# Patient Record
Sex: Female | Born: 1940 | Race: White | Hispanic: No | Smoking: Never smoker
Health system: Southern US, Community
[De-identification: ages and names within clinical notes are randomized; demographics above are authoritative.]

## PROBLEM LIST (undated history)

## (undated) DIAGNOSIS — N200 Calculus of kidney: Secondary | ICD-10-CM

## (undated) DIAGNOSIS — I471 Supraventricular tachycardia: Secondary | ICD-10-CM

## (undated) DIAGNOSIS — K219 Gastro-esophageal reflux disease without esophagitis: Secondary | ICD-10-CM

## (undated) DIAGNOSIS — N185 Chronic kidney disease, stage 5: Secondary | ICD-10-CM

## (undated) DIAGNOSIS — K7460 Unspecified cirrhosis of liver: Secondary | ICD-10-CM

## (undated) DIAGNOSIS — L03116 Cellulitis of left lower limb: Secondary | ICD-10-CM

## (undated) DIAGNOSIS — I10 Essential (primary) hypertension: Secondary | ICD-10-CM

## (undated) DIAGNOSIS — N390 Urinary tract infection, site not specified: Secondary | ICD-10-CM

## (undated) DIAGNOSIS — Z87442 Personal history of urinary calculi: Secondary | ICD-10-CM

## (undated) HISTORY — DX: Gastro-esophageal reflux disease without esophagitis: K21.9

## (undated) HISTORY — DX: Essential (primary) hypertension: I10

## (undated) HISTORY — PX: ABDOMINAL HYSTERECTOMY: SHX81

## (undated) HISTORY — DX: Urinary tract infection, site not specified: N39.0

## (undated) HISTORY — DX: Supraventricular tachycardia: I47.1

## (undated) HISTORY — DX: Calculus of kidney: N20.0

---

## 2004-06-05 ENCOUNTER — Other Ambulatory Visit: Payer: Self-pay

## 2004-10-22 ENCOUNTER — Emergency Department: Payer: Self-pay | Admitting: Emergency Medicine

## 2007-02-19 ENCOUNTER — Ambulatory Visit: Payer: Self-pay | Admitting: Internal Medicine

## 2007-03-22 ENCOUNTER — Ambulatory Visit: Payer: Self-pay | Admitting: Internal Medicine

## 2007-04-17 ENCOUNTER — Ambulatory Visit: Payer: Self-pay | Admitting: Internal Medicine

## 2007-04-21 ENCOUNTER — Ambulatory Visit: Payer: Self-pay | Admitting: Internal Medicine

## 2007-05-22 ENCOUNTER — Ambulatory Visit: Payer: Self-pay | Admitting: Internal Medicine

## 2007-06-22 ENCOUNTER — Ambulatory Visit: Payer: Self-pay | Admitting: Internal Medicine

## 2007-07-22 ENCOUNTER — Ambulatory Visit: Payer: Self-pay | Admitting: Internal Medicine

## 2007-08-22 ENCOUNTER — Ambulatory Visit: Payer: Self-pay | Admitting: Internal Medicine

## 2008-04-12 LAB — HM MAMMOGRAPHY: HM Mammogram: NORMAL

## 2008-04-13 ENCOUNTER — Ambulatory Visit: Payer: Self-pay | Admitting: Unknown Physician Specialty

## 2009-04-12 LAB — HM PAP SMEAR

## 2009-08-19 ENCOUNTER — Emergency Department: Payer: Self-pay | Admitting: Emergency Medicine

## 2009-10-20 ENCOUNTER — Emergency Department: Payer: Self-pay | Admitting: Emergency Medicine

## 2010-07-06 ENCOUNTER — Emergency Department: Payer: Self-pay | Admitting: Emergency Medicine

## 2010-12-06 ENCOUNTER — Emergency Department: Payer: Self-pay | Admitting: Emergency Medicine

## 2012-11-02 ENCOUNTER — Ambulatory Visit: Payer: Self-pay | Admitting: Adult Health

## 2013-04-12 ENCOUNTER — Ambulatory Visit (INDEPENDENT_AMBULATORY_CARE_PROVIDER_SITE_OTHER): Payer: Medicare PPO | Admitting: Internal Medicine

## 2013-04-12 ENCOUNTER — Encounter: Payer: Self-pay | Admitting: Internal Medicine

## 2013-04-12 VITALS — BP 190/118 | HR 85 | Temp 97.8°F | Ht 63.0 in | Wt 119.0 lb

## 2013-04-12 DIAGNOSIS — F4329 Adjustment disorder with other symptoms: Secondary | ICD-10-CM | POA: Insufficient documentation

## 2013-04-12 DIAGNOSIS — I10 Essential (primary) hypertension: Secondary | ICD-10-CM

## 2013-04-12 DIAGNOSIS — F4321 Adjustment disorder with depressed mood: Secondary | ICD-10-CM

## 2013-04-12 DIAGNOSIS — Z1239 Encounter for other screening for malignant neoplasm of breast: Secondary | ICD-10-CM

## 2013-04-12 DIAGNOSIS — R799 Abnormal finding of blood chemistry, unspecified: Secondary | ICD-10-CM

## 2013-04-12 DIAGNOSIS — Z1211 Encounter for screening for malignant neoplasm of colon: Secondary | ICD-10-CM

## 2013-04-12 DIAGNOSIS — N8110 Cystocele, unspecified: Secondary | ICD-10-CM

## 2013-04-12 DIAGNOSIS — Z0000 Encounter for general adult medical examination without abnormal findings: Secondary | ICD-10-CM

## 2013-04-12 DIAGNOSIS — R7989 Other specified abnormal findings of blood chemistry: Secondary | ICD-10-CM

## 2013-04-12 DIAGNOSIS — R779 Abnormality of plasma protein, unspecified: Secondary | ICD-10-CM

## 2013-04-12 DIAGNOSIS — N8111 Cystocele, midline: Secondary | ICD-10-CM

## 2013-04-12 LAB — CBC WITH DIFFERENTIAL/PLATELET
Basophils Absolute: 0 10*3/uL (ref 0.0–0.1)
HCT: 48.9 % — ABNORMAL HIGH (ref 36.0–46.0)
Lymphs Abs: 2.3 10*3/uL (ref 0.7–4.0)
MCV: 94.8 fl (ref 78.0–100.0)
Monocytes Absolute: 0.5 10*3/uL (ref 0.1–1.0)
Monocytes Relative: 5.4 % (ref 3.0–12.0)
Neutrophils Relative %: 66.8 % (ref 43.0–77.0)
Platelets: 198 10*3/uL (ref 150.0–400.0)
RDW: 13.3 % (ref 11.5–14.6)

## 2013-04-12 LAB — COMPREHENSIVE METABOLIC PANEL
AST: 139 U/L — ABNORMAL HIGH (ref 0–37)
Alkaline Phosphatase: 164 U/L — ABNORMAL HIGH (ref 39–117)
Glucose, Bld: 137 mg/dL — ABNORMAL HIGH (ref 70–99)
Potassium: 5.1 mEq/L (ref 3.5–5.1)
Sodium: 139 mEq/L (ref 135–145)
Total Bilirubin: 1.8 mg/dL — ABNORMAL HIGH (ref 0.3–1.2)
Total Protein: 9 g/dL — ABNORMAL HIGH (ref 6.0–8.3)

## 2013-04-12 LAB — LDL CHOLESTEROL, DIRECT: Direct LDL: 171.5 mg/dL

## 2013-04-12 LAB — LIPID PANEL
Cholesterol: 249 mg/dL — ABNORMAL HIGH (ref 0–200)
HDL: 46.8 mg/dL (ref >39.00)
Total CHOL/HDL Ratio: 5
Triglycerides: 169 mg/dL — ABNORMAL HIGH (ref 0.0–149.0)

## 2013-04-12 LAB — MICROALBUMIN / CREATININE URINE RATIO: Microalb, Ur: 0.7 mg/dL (ref 0.0–1.9)

## 2013-04-12 LAB — HM COLONOSCOPY

## 2013-04-12 MED ORDER — SERTRALINE HCL 25 MG PO TABS: 25.0000 mg | ORAL_TABLET | Freq: Every day | ORAL | Status: DC

## 2013-04-12 MED ORDER — METOPROLOL SUCCINATE ER 200 MG PO TB24: 200.0000 mg | ORAL_TABLET | Freq: Every day | ORAL | Status: DC

## 2013-04-12 MED ORDER — CLORAZEPATE DIPOTASSIUM 7.5 MG PO TABS: 7.5000 mg | ORAL_TABLET | Freq: Two times a day (BID) | ORAL | Status: DC | PRN

## 2013-04-12 NOTE — Addendum Note (Signed)
Addended by: Ronna Polio A on: 04/12/2013 03:44 PM   Modules accepted: Orders

## 2013-04-12 NOTE — Assessment & Plan Note (Signed)
Pt lost her husband suddenly 3 years ago. Continues to struggle with depressed mood and anxiety. Reports strong support from family and friends. Discussed starting medication to help with symptoms of depressed mood and anxiety. Will start Sertraline 25mg  daily. Follow up in 3-4 weeks. Pt will call or email sooner if any problems with the medication.

## 2013-04-12 NOTE — Assessment & Plan Note (Signed)
BP elevated today, however pt reports good control of BP at home. Will continue Toprol. She will monitor BP at home and bring her cuff to follow up to check against ours for accuracy. Will also request notes from her cardiologist, Dr. Lady Gary. Will check renal function and urine microalbumin today.

## 2013-04-12 NOTE — Assessment & Plan Note (Signed)
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 Discussed possible referral to urogynecology, however pt would like to hold off for now. She will call if she changes her mind.

## 2013-04-12 NOTE — Progress Notes (Signed)
Subjective:    Patient ID: Susan Greer, female    DOB: 1940/11/11, 72 y.o.   MRN: [id number]  HPI 72 year old female with history of hypertension and anxiety presents to establish care. She reports it has been very difficult time for her after the death of her husband suddenly from an abdominal aortic aneurysm 3 years ago. She reports she has struggled with depression and anxiety. She reports strong support from friends and family members. She intermittently takes Tranxene to help with anxiety but has never been on daily medication. She has not sought counseling. She continues to function well and dissipates in her usual activities.  She is also concerned about a history of bladder prolapse. She reports this is been present ever since her hysterectomy. She sometimes has to apply pelvic pressure in order to pass urine. She has not had recurrent urinary tract infections. She does not have leakage of urine. Symptoms are made worse by prolonged standing or upright activity. Symptoms are improved by lying flat. She denies any dysuria, urgency, frequency.  In regards to blood pressure, she reports blood pressure is typically well-controlled at home 120 to 130 over 60s to 70s. Blood pressure tends to be elevated when she is seen by physician. She is compliant with medication. She denies any chest pain, palpitations, headache.  Outpatient Encounter Prescriptions as of 04/12/2013  Medication Sig Dispense Refill  . clorazepate (TRANXENE-T) 7.5 MG tablet Take 1 tablet (7.5 mg total) by mouth 2 (two) times daily as needed for anxiety.  180 tablet  1  . metoprolol (TOPROL XL) 200 MG 24 hr tablet Take 1 tablet (200 mg total) by mouth daily.  90 tablet  4   No facility-administered encounter medications on file as of 04/12/2013.   BP 190/118  Pulse 85  Temp(Src) 97.8 F (36.6 C) (Oral)  Ht 5\' 3"  (1.6 m)  Wt 119 lb (53.978 kg)  BMI 21.09 kg/m2  SpO2 95%  Review of Systems  Constitutional: Negative for  fever, chills, appetite change, fatigue and unexpected weight change.  HENT: Negative for ear pain, congestion, sore throat, trouble swallowing, neck pain, voice change and sinus pressure.   Eyes: Negative for visual disturbance.  Respiratory: Negative for cough, shortness of breath, wheezing and stridor.   Cardiovascular: Negative for chest pain, palpitations and leg swelling.  Gastrointestinal: Negative for nausea, vomiting, abdominal pain, diarrhea, constipation, blood in stool, abdominal distention and anal bleeding.  Genitourinary: Negative for dysuria and flank pain.  Musculoskeletal: Negative for myalgias, arthralgias and gait problem.  Skin: Negative for color change and rash.  Neurological: Negative for dizziness and headaches.  Hematological: Negative for adenopathy. Does not bruise/bleed easily.  Psychiatric/Behavioral: Positive for dysphoric mood. Negative for suicidal ideas and sleep disturbance. The patient is nervous/anxious.        Objective:   Physical Exam  Constitutional: She is oriented to person, place, and time. She appears well-developed and well-nourished. No distress.  HENT:  Head: Normocephalic and atraumatic.  Right Ear: External ear normal.  Left Ear: External ear normal.  Nose: Nose normal.  Mouth/Throat: Oropharynx is clear and moist. No oropharyngeal exudate.  Eyes: Conjunctivae are normal. Pupils are equal, round, and reactive to light. Right eye exhibits no discharge. Left eye exhibits no discharge. No scleral icterus.  Neck: Normal range of motion. Neck supple. No tracheal deviation present. No thyromegaly present.  Cardiovascular: Normal rate, regular rhythm, normal heart sounds and intact distal pulses.  Exam reveals no gallop and no friction rub.  No murmur heard. Pulmonary/Chest: Effort normal and breath sounds normal. No accessory muscle usage. Not tachypneic. No respiratory distress. She has no decreased breath sounds. She has no wheezes. She has  no rhonchi. She has no rales. She exhibits no tenderness.  Abdominal: Soft. Bowel sounds are normal. She exhibits no distension and no mass. There is no tenderness. There is no rebound and no guarding.  Musculoskeletal: Normal range of motion. She exhibits no edema and no tenderness.  Lymphadenopathy:    She has no cervical adenopathy.  Neurological: She is alert and oriented to person, place, and time. No cranial nerve deficit. She exhibits normal muscle tone. Coordination normal.  Skin: Skin is warm and dry. No rash noted. She is not diaphoretic. No erythema. No pallor.  Psychiatric: Her speech is normal and behavior is normal. Judgment and thought content normal. Her mood appears anxious. Cognition and memory are normal. She exhibits a depressed mood.          Assessment & Plan:

## 2013-04-14 ENCOUNTER — Ambulatory Visit: Payer: Self-pay | Admitting: Internal Medicine

## 2013-04-15 ENCOUNTER — Telehealth: Payer: Self-pay | Admitting: Internal Medicine

## 2013-04-15 ENCOUNTER — Ambulatory Visit: Payer: Medicare PPO | Admitting: Internal Medicine

## 2013-04-15 DIAGNOSIS — I10 Essential (primary) hypertension: Secondary | ICD-10-CM

## 2013-04-15 NOTE — Telephone Encounter (Signed)
Can you please call and ask her to have labs done tomorrow (Friday), including a repeat CMP and the additional liver labs I have ordered, so that some of the results might be back before next week? I am very concerned about her liver function and she did not come to appt today.

## 2013-04-16 ENCOUNTER — Other Ambulatory Visit (INDEPENDENT_AMBULATORY_CARE_PROVIDER_SITE_OTHER): Payer: Medicare PPO

## 2013-04-16 ENCOUNTER — Other Ambulatory Visit: Payer: Self-pay | Admitting: Internal Medicine

## 2013-04-16 DIAGNOSIS — I10 Essential (primary) hypertension: Secondary | ICD-10-CM

## 2013-04-16 LAB — COMPREHENSIVE METABOLIC PANEL
Albumin: 3.9 g/dL (ref 3.5–5.2)
Alkaline Phosphatase: 167 U/L — ABNORMAL HIGH (ref 39–117)
CO2: 26 mEq/L (ref 19–32)
Chloride: 105 mEq/L (ref 96–112)
Glucose, Bld: 113 mg/dL — ABNORMAL HIGH (ref 70–99)
Potassium: 4.7 mEq/L (ref 3.5–5.1)
Sodium: 139 mEq/L (ref 135–145)
Total Protein: 9 g/dL — ABNORMAL HIGH (ref 6.0–8.3)

## 2013-04-16 NOTE — Telephone Encounter (Signed)
Patient informed and verbally agreed she will come in today.

## 2013-04-20 ENCOUNTER — Encounter: Payer: Self-pay | Admitting: Internal Medicine

## 2013-04-20 ENCOUNTER — Encounter: Payer: Self-pay | Admitting: Emergency Medicine

## 2013-04-20 ENCOUNTER — Ambulatory Visit (INDEPENDENT_AMBULATORY_CARE_PROVIDER_SITE_OTHER): Payer: Medicare PPO | Admitting: Internal Medicine

## 2013-04-20 VITALS — BP 200/110 | HR 81 | Temp 98.3°F | Wt 120.0 lb

## 2013-04-20 DIAGNOSIS — I10 Essential (primary) hypertension: Secondary | ICD-10-CM

## 2013-04-20 DIAGNOSIS — R7989 Other specified abnormal findings of blood chemistry: Secondary | ICD-10-CM

## 2013-04-20 DIAGNOSIS — D751 Secondary polycythemia: Secondary | ICD-10-CM

## 2013-04-20 LAB — FERRITIN: Ferritin: 715.4 ng/mL — ABNORMAL HIGH (ref 10.0–291.0)

## 2013-04-20 LAB — HEPATIC FUNCTION PANEL
ALT: 99 U/L — ABNORMAL HIGH (ref 0–35)
AST: 110 U/L — ABNORMAL HIGH (ref 0–37)
Albumin: 3.7 g/dL (ref 3.5–5.2)

## 2013-04-20 LAB — TSH: TSH: 1.59 u[IU]/mL (ref 0.35–5.50)

## 2013-04-20 LAB — IBC PANEL: Saturation Ratios: 80.2 % — ABNORMAL HIGH (ref 20.0–50.0)

## 2013-04-20 NOTE — Progress Notes (Signed)
Subjective:    Patient ID: Susan Greer, female    DOB: Aug 31, 1941, 72 y.o.   MRN: [id number]  HPI 72 year old female with history of hypertension and anxiety/depression presents for followup. At her recent visit, she was noted to have elevated liver function tests. On discussion today, she reports a history of elevated liver function tests in the past and also notes a history of elevated hemoglobin. She reports evaluation in the past by hematology and need for phlebotomy. She has not recently been seen by hematology. She denies any recent illnesses.  In regards to blood pressure, she reports blood pressure has been well-controlled at home. Typically less than 120/80. She denies any headache, palpitations, shortness of breath, chest pain. She is compliant with medication.  Outpatient Encounter Prescriptions as of 04/20/2013  Medication Sig Dispense Refill  . clorazepate (TRANXENE-T) 7.5 MG tablet Take 1 tablet (7.5 mg total) by mouth 2 (two) times daily as needed for anxiety.  180 tablet  1  . metoprolol (TOPROL XL) 200 MG 24 hr tablet Take 1 tablet (200 mg total) by mouth daily.  90 tablet  4  . sertraline (ZOLOFT) 25 MG tablet Take 1 tablet (25 mg total) by mouth daily.  30 tablet  3   No facility-administered encounter medications on file as of 04/20/2013.   BP 200/110  Pulse 81  Temp(Src) 98.3 F (36.8 C) (Oral)  Wt 120 lb (54.432 kg)  BMI 21.26 kg/m2  SpO2 96%  Review of Systems  Constitutional: Negative for fever, chills, appetite change, fatigue and unexpected weight change.  HENT: Negative for ear pain, congestion, sore throat, trouble swallowing, neck pain, voice change and sinus pressure.   Eyes: Negative for visual disturbance.  Respiratory: Negative for cough, shortness of breath, wheezing and stridor.   Cardiovascular: Negative for chest pain, palpitations and leg swelling.  Gastrointestinal: Negative for nausea, vomiting, abdominal pain, diarrhea, constipation, blood in stool,  abdominal distention and anal bleeding.  Genitourinary: Negative for dysuria and flank pain.  Musculoskeletal: Negative for myalgias, arthralgias and gait problem.  Skin: Negative for color change and rash.  Neurological: Negative for dizziness and headaches.  Hematological: Negative for adenopathy. Does not bruise/bleed easily.  Psychiatric/Behavioral: Negative for suicidal ideas, sleep disturbance and dysphoric mood. The patient is nervous/anxious.        Objective:   Physical Exam  Constitutional: She is oriented to person, place, and time. She appears well-developed and well-nourished. No distress.  HENT:  Head: Normocephalic and atraumatic.  Right Ear: External ear normal.  Left Ear: External ear normal.  Nose: Nose normal.  Mouth/Throat: Oropharynx is clear and moist. No oropharyngeal exudate.  Eyes: Conjunctivae are normal. Pupils are equal, round, and reactive to light. Right eye exhibits no discharge. Left eye exhibits no discharge. No scleral icterus.  Neck: Normal range of motion. Neck supple. No tracheal deviation present. No thyromegaly present.  Cardiovascular: Normal rate, regular rhythm, normal heart sounds and intact distal pulses.  Exam reveals no gallop and no friction rub.   No murmur heard. Pulmonary/Chest: Effort normal and breath sounds normal. No accessory muscle usage. Not tachypneic. No respiratory distress. She has no decreased breath sounds. She has no wheezes. She has no rhonchi. She has no rales. She exhibits no tenderness.  Abdominal: Soft. Bowel sounds are normal. She exhibits no distension and no mass. There is no hepatosplenomegaly. There is no tenderness. There is no rebound and no guarding.  Musculoskeletal: Normal range of motion. She exhibits no edema and no tenderness.  Lymphadenopathy:    She has no cervical adenopathy.  Neurological: She is alert and oriented to person, place, and time. No cranial nerve deficit. She exhibits normal muscle tone.  Coordination normal.  Skin: Skin is warm and dry. No rash noted. She is not diaphoretic. No erythema. No pallor.  Psychiatric: She has a normal mood and affect. Her behavior is normal. Judgment and thought content normal.          Assessment & Plan:

## 2013-04-20 NOTE — Assessment & Plan Note (Signed)
BP Readings from Last 3 Encounters:  04/20/13 200/110  04/12/13 190/118   Blood pressure markedly elevated in clinic but is well-controlled at home. Patient has long history of whitecoat hypertension. Will continue to monitor. Continue metoprolol.

## 2013-04-20 NOTE — Assessment & Plan Note (Signed)
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 Will request notes from her hematologist.

## 2013-04-20 NOTE — Assessment & Plan Note (Signed)
Patient was noted to have markedly elevated liver function test, erythrocytosis on recent labs. 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 Will request notes from former hematologist. Will also send additional testing including DNA testing for hemochromatosis today. Reviewed recent ultrasound of the liver which showed heterogenous pattern and steatohepatitis, but no other acute process. Pt has follow up with GI in 4 weeks. Will follow up here in 6 weeks and depending on lab findings.

## 2013-04-22 LAB — PROTEIN ELECTROPHORESIS, SERUM
Albumin ELP: 46.8 % — ABNORMAL LOW (ref 55.8–66.1)
Alpha-1-Globulin: 6.8 % — ABNORMAL HIGH (ref 2.9–4.9)
Alpha-2-Globulin: 8.3 % (ref 7.1–11.8)
Beta 2: 4.7 % (ref 3.2–6.5)
Beta Globulin: 6.2 % (ref 4.7–7.2)
Total Protein, Serum Electrophoresis: 7.9 g/dL (ref 6.0–8.3)

## 2013-04-22 LAB — HEMOCHROMATOSIS DNA-PCR(C282Y,H63D): DNA Mutation Analysis: NOT DETECTED

## 2013-04-28 ENCOUNTER — Telehealth: Payer: Self-pay | Admitting: Internal Medicine

## 2013-04-28 NOTE — Telephone Encounter (Signed)
I reviewed notes from Dr. Sherrlyn Hock 2008. I am concerned about elevated LFTs, ferritin, and hemoglobin. I want her to follow up as scheduled with GI, as I am questioning whether additional testing such as liver biopsy might be helpful. Please forward a copy of the notes to Dr. Markham Jordan.

## 2013-04-30 NOTE — Telephone Encounter (Signed)
Patient aware, her appointment is not until August with Dr. Markham Jordan and information faxed at [phone]

## 2013-05-12 ENCOUNTER — Encounter: Payer: Self-pay | Admitting: Internal Medicine

## 2013-05-26 ENCOUNTER — Other Ambulatory Visit: Payer: Self-pay

## 2013-08-23 ENCOUNTER — Other Ambulatory Visit: Payer: Self-pay | Admitting: *Deleted

## 2013-08-23 DIAGNOSIS — F4329 Adjustment disorder with other symptoms: Secondary | ICD-10-CM

## 2013-08-23 MED ORDER — SERTRALINE HCL 25 MG PO TABS: 25.0000 mg | ORAL_TABLET | Freq: Every day | ORAL | Status: DC

## 2013-10-23 ENCOUNTER — Other Ambulatory Visit: Payer: Self-pay | Admitting: Internal Medicine

## 2013-11-22 ENCOUNTER — Other Ambulatory Visit: Payer: Self-pay | Admitting: Internal Medicine

## 2013-11-22 MED ORDER — SERTRALINE HCL 25 MG PO TABS: ORAL_TABLET | ORAL | Status: DC

## 2014-01-05 ENCOUNTER — Other Ambulatory Visit: Payer: Self-pay | Admitting: Internal Medicine

## 2014-01-05 ENCOUNTER — Telehealth: Payer: Self-pay | Admitting: Internal Medicine

## 2014-01-05 NOTE — Telephone Encounter (Signed)
Refill? Last appt 7/14. Next appt scheduled 02/23/14

## 2014-01-05 NOTE — Telephone Encounter (Signed)
Patient called in to make an appointment and she stated that she was due for a colonoscopy she said that you talked with her at her last visit about sending her to Dr. Mechele CollinElliott. Please advise. She does have an appointment in May.

## 2014-01-05 NOTE — Telephone Encounter (Signed)
Let's request her last colonoscopy from Dr. Markham JordanElliot, so I can determine when she is due.

## 2014-01-13 NOTE — Telephone Encounter (Signed)
OK. She should just follow as scheduled in May

## 2014-01-13 NOTE — Telephone Encounter (Signed)
Dr. Earnest ConroyElliott's office does not have anything on this patient.

## 2014-01-13 NOTE — Telephone Encounter (Signed)
Patient informed and will discuss further at her appointment in May with Dr. Dan HumphreysWalker

## 2014-02-02 ENCOUNTER — Other Ambulatory Visit: Payer: Self-pay | Admitting: Internal Medicine

## 2014-02-23 ENCOUNTER — Ambulatory Visit (INDEPENDENT_AMBULATORY_CARE_PROVIDER_SITE_OTHER): Payer: Medicare PPO | Admitting: Internal Medicine

## 2014-02-23 ENCOUNTER — Telehealth: Payer: Self-pay | Admitting: Internal Medicine

## 2014-02-23 ENCOUNTER — Encounter: Payer: Self-pay | Admitting: Internal Medicine

## 2014-02-23 VITALS — BP 220/110 | HR 81 | Temp 98.3°F | Ht 63.5 in | Wt 122.0 lb

## 2014-02-23 DIAGNOSIS — I10 Essential (primary) hypertension: Secondary | ICD-10-CM

## 2014-02-23 DIAGNOSIS — Z1239 Encounter for other screening for malignant neoplasm of breast: Secondary | ICD-10-CM

## 2014-02-23 DIAGNOSIS — F411 Generalized anxiety disorder: Secondary | ICD-10-CM

## 2014-02-23 DIAGNOSIS — F329 Major depressive disorder, single episode, unspecified: Secondary | ICD-10-CM

## 2014-02-23 DIAGNOSIS — D751 Secondary polycythemia: Secondary | ICD-10-CM

## 2014-02-23 DIAGNOSIS — R7989 Other specified abnormal findings of blood chemistry: Secondary | ICD-10-CM

## 2014-02-23 DIAGNOSIS — Z7189 Other specified counseling: Secondary | ICD-10-CM | POA: Insufficient documentation

## 2014-02-23 DIAGNOSIS — F3289 Other specified depressive episodes: Secondary | ICD-10-CM

## 2014-02-23 DIAGNOSIS — R945 Abnormal results of liver function studies: Secondary | ICD-10-CM

## 2014-02-23 DIAGNOSIS — Z0000 Encounter for general adult medical examination without abnormal findings: Secondary | ICD-10-CM

## 2014-02-23 DIAGNOSIS — F32A Depression, unspecified: Secondary | ICD-10-CM | POA: Insufficient documentation

## 2014-02-23 LAB — CBC WITH DIFFERENTIAL/PLATELET
Basophils Absolute: 0 10*3/uL (ref 0.0–0.1)
Basophils Relative: 0.4 % (ref 0.0–3.0)
EOS PCT: 1.4 % (ref 0.0–5.0)
Eosinophils Absolute: 0.1 10*3/uL (ref 0.0–0.7)
HCT: 46.9 % — ABNORMAL HIGH (ref 36.0–46.0)
Hemoglobin: 16.1 g/dL — ABNORMAL HIGH (ref 12.0–15.0)
LYMPHS ABS: 1.8 10*3/uL (ref 0.7–4.0)
Lymphocytes Relative: 20.2 % (ref 12.0–46.0)
MCHC: 34.3 g/dL (ref 30.0–36.0)
MCV: 94.2 fl (ref 78.0–100.0)
Monocytes Absolute: 0.3 10*3/uL (ref 0.1–1.0)
Monocytes Relative: 3.2 % (ref 3.0–12.0)
Neutro Abs: 6.6 10*3/uL (ref 1.4–7.7)
Neutrophils Relative %: 74.8 % (ref 43.0–77.0)
PLATELETS: 198 10*3/uL (ref 150.0–400.0)
RBC: 4.98 Mil/uL (ref 3.87–5.11)
RDW: 13.8 % (ref 11.5–15.5)
WBC: 8.8 10*3/uL (ref 4.0–10.5)

## 2014-02-23 LAB — COMPREHENSIVE METABOLIC PANEL
ALK PHOS: 169 U/L — AB (ref 39–117)
ALT: 90 U/L — ABNORMAL HIGH (ref 0–35)
AST: 126 U/L — ABNORMAL HIGH (ref 0–37)
Albumin: 3.9 g/dL (ref 3.5–5.2)
BUN: 16 mg/dL (ref 6–23)
CO2: 26 mEq/L (ref 19–32)
Calcium: 9.5 mg/dL (ref 8.4–10.5)
Chloride: 103 mEq/L (ref 96–112)
Creatinine, Ser: 1 mg/dL (ref 0.4–1.2)
GFR: 60.7 mL/min (ref >60.00)
GLUCOSE: 125 mg/dL — AB (ref 70–99)
POTASSIUM: 4.2 meq/L (ref 3.5–5.1)
SODIUM: 137 meq/L (ref 135–145)
TOTAL PROTEIN: 8.8 g/dL — AB (ref 6.0–8.3)
Total Bilirubin: 1.6 mg/dL — ABNORMAL HIGH (ref 0.2–1.2)

## 2014-02-23 LAB — LIPID PANEL
CHOLESTEROL: 239 mg/dL — AB (ref 0–200)
HDL: 41 mg/dL (ref >39.00)
LDL Cholesterol: 157 mg/dL — ABNORMAL HIGH (ref 0–99)
TRIGLYCERIDES: 206 mg/dL — AB (ref 0.0–149.0)
Total CHOL/HDL Ratio: 6
VLDL: 41.2 mg/dL — AB (ref 0.0–40.0)

## 2014-02-23 LAB — MICROALBUMIN / CREATININE URINE RATIO
CREATININE, U: 156 mg/dL
Microalb Creat Ratio: 0.4 mg/g (ref 0.0–30.0)
Microalb, Ur: 0.6 mg/dL (ref 0.0–1.9)

## 2014-02-23 LAB — HM COLONOSCOPY

## 2014-02-23 MED ORDER — CLORAZEPATE DIPOTASSIUM 7.5 MG PO TABS: 7.5000 mg | ORAL_TABLET | Freq: Two times a day (BID) | ORAL | Status: DC | PRN

## 2014-02-23 MED ORDER — METOPROLOL SUCCINATE ER 200 MG PO TB24: 200.0000 mg | ORAL_TABLET | Freq: Every day | ORAL | Status: DC

## 2014-02-23 MED ORDER — SERTRALINE HCL 50 MG PO TABS: 50.0000 mg | ORAL_TABLET | Freq: Every day | ORAL | Status: DC

## 2014-02-23 NOTE — Progress Notes (Signed)
Pre visit review using our clinic review tool, if applicable. No additional management support is needed unless otherwise documented below in the visit note. 

## 2014-02-23 NOTE — Assessment & Plan Note (Addendum)
General medical exam including breast exam normal except as noted. Mammogram ordered. PAP and pelvic deferred given pt age and preference. Colonoscopy declined. Discussed Cologuard testing and information given to pt. Prevnar declined. Flu vaccine UTD. Will check labs including CBC, CMP, lipids, urine microalbumin. Encouraged healthy diet and regular exercise. Follow up to recheck BP in 1 week nurse check and 4 weeks physician.

## 2014-02-23 NOTE — Progress Notes (Signed)
The patient is here for annual Medicare Wellness Examination and management of other chronic and acute problems.   The risk factors are reflected in the history.  The roster of all physicians providing medical care to patient - is listed in the Snapshot section of the chart.  Activities of daily living:   The patient is 100% independent in all ADLs: dressing, toileting, feeding as well as independent mobility. [place] Dog recently died. Son lives across the road.  Home safety :  The patient has smoke detectors in the home.  They wear seatbelts in their car. There are no firearms at home.  There is no violence in the home. They feel safe where they live.  Infectious Risks: There is no risks for hepatitis, STDs or HIV.  There is no  history of blood transfusion.  They have no travel history to infectious disease endemic areas of the world.  Additional Health Care Providers: The patient has seen their dentist in the last six months. Dentist - Dr. Axel Fillerausey They have seen their eye doctor in the last year. [facility]thalmologist - [facility] They deny hearing issues. They have deferred audiologic testing in the last year.   They do not  have excessive sun exposure. Discussed the need for sun protection: hats,long sleeves and use of sunscreen if there is significant sun exposure.  Dermatologist - Dr. [facility] - Dr. Lady GaryFath  Diet: the importance of a healthy diet is discussed. They do have a healthy diet.  The benefits of regular aerobic exercise were discussed. Patient exercises by gardening.  Depression screen: there are no signs or vegative symptoms of depression- irritability, change in appetite, anhedonia, sadness/tearfullness.  Cognitive assessment: the patient manages all their financial and personal affairs and is actively engaged.   HCPOA - sons. Kathrynn SpeedAnthony Gagen and Lucienne Capersobert Lucking  The following portions of the patient's history were reviewed and  updated as appropriate: allergies, current medications, past family history, past medical history,  past surgical history, past social history and problem list.  Visual acuity was not assessed per patient preference as they have regular follow up with their ophthalmologist. Hearing and body mass index were assessed and reviewed.   During the course of the visit the patient was educated and counseled about appropriate screening and preventive services including : fall prevention , diabetes screening, nutrition counseling, colorectal cancer screening, and recommended immunizations.    She notes that BP has been well controlled at home, typically near 130/70. She is compliant with Metoprolol. No chest pain, palpitations, headache.  She notes increased symptoms of depressed mood with recent death of her dog. She notes some improvement with Sertraline, but not resolution of symptoms. She occasionally takes Tranxene to help control anxiety or when she has issues with insomnia.  Review of Systems  Constitutional: Negative for fever, chills, appetite change, fatigue and unexpected weight change.  HENT: Negative for congestion, ear pain, sinus pressure, sore throat, trouble swallowing and voice change.   Eyes: Negative for visual disturbance.  Respiratory: Negative for cough, shortness of breath, wheezing and stridor.   Cardiovascular: Negative for chest pain, palpitations and leg swelling.  Gastrointestinal: Negative for nausea, vomiting, abdominal pain, diarrhea, constipation, blood in stool, abdominal distention and anal bleeding.  Genitourinary: Negative for dysuria and flank pain.  Musculoskeletal: Negative for arthralgias, gait problem, myalgias and neck pain.  Skin: Negative for color change and rash.  Neurological: Negative for dizziness and headaches.  Hematological: Negative for adenopathy. Does  not bruise/bleed easily.  Psychiatric/Behavioral: Positive for dysphoric mood. Negative for  suicidal ideas and sleep disturbance. The patient is nervous/anxious.        Objective:    BP 220/110  Pulse 81  Temp(Src) 98.3 F (36.8 C) (Oral)  Ht 5' 3.5" (1.613 m)  Wt 122 lb (55.339 kg)  BMI 21.27 kg/m2  SpO2 98% Physical Exam  Constitutional: She is oriented to person, place, and time. She appears well-developed and well-nourished. No distress.  HENT:  Head: Normocephalic and atraumatic.  Right Ear: External ear normal.  Left Ear: External ear normal.  Nose: Nose normal.  Mouth/Throat: Oropharynx is clear and moist. No oropharyngeal exudate.  Eyes: Conjunctivae are normal. Pupils are equal, round, and reactive to light. Right eye exhibits no discharge. Left eye exhibits no discharge. No scleral icterus.  Neck: Normal range of motion. Neck supple. No tracheal deviation present. No thyromegaly present.  Cardiovascular: Normal rate, regular rhythm, normal heart sounds and intact distal pulses.  Exam reveals no gallop and no friction rub.   No murmur heard. Pulmonary/Chest: Effort normal and breath sounds normal. No accessory muscle usage. Not tachypneic. No respiratory distress. She has no decreased breath sounds. She has no wheezes. She has no rhonchi. She has no rales. She exhibits no tenderness. Right breast exhibits no inverted nipple, no mass, no nipple discharge, no skin change and no tenderness. Left breast exhibits no inverted nipple, no mass, no nipple discharge, no skin change and no tenderness. Breasts are symmetrical.  Abdominal: Soft. Bowel sounds are normal. She exhibits no distension and no mass. There is no tenderness. There is no rebound and no guarding.  Musculoskeletal: Normal range of motion. She exhibits no edema and no tenderness.  Lymphadenopathy:    She has no cervical adenopathy.  Neurological: She is alert and oriented to person, place, and time. No cranial nerve deficit. She exhibits normal muscle tone. Coordination normal.  Skin: Skin is warm and dry.  No rash noted. She is not diaphoretic. No erythema. No pallor.  Psychiatric: Her behavior is normal. Judgment and thought content normal. She exhibits a depressed mood.          Assessment & Plan:   Problem List Items Addressed This Visit   Anxiety state, unspecified   Relevant Medications      clorazepate (TRANXENE) tablet      sertraline (ZOLOFT) tablet   Depression     Recent worsening symptoms of depression after death her her dog. Offered support today. Will increase Sertraline to 50mg  daily. Follow up 4 weeks and prn.    Relevant Medications      clorazepate (TRANXENE) tablet      sertraline (ZOLOFT) tablet   Essential hypertension, benign      BP Readings from Last 3 Encounters:  02/23/14 220/110  04/20/13 200/110  04/12/13 190/118   BP elevated today, however pt has long h/o HTN when measured in the office, typically normal at home. Will have her stop by for nurse recheck later this week. She will also RTC in 4 weeks for recheck. She will check BP at home and email or call with readings. Continue Metoprolol. Consider amb BP monitoring if persistent elevation of BP in the office.    Relevant Medications      metoprolol (TOPROL-XL) 24 hr tablet   Medicare annual wellness visit, subsequent - Primary     General medical exam including breast exam normal except as noted. Mammogram ordered. PAP and pelvic deferred given pt age  and preference. Colonoscopy declined. Discussed Cologuard testing and information given to pt. Prevnar declined. Flu vaccine UTD. Will check labs including CBC, CMP, lipids, urine microalbumin. Encouraged healthy diet and regular exercise. Follow up to recheck BP in 1 week nurse check and 4 weeks physician.    Relevant Orders      CBC with Differential      Comprehensive metabolic panel      Lipid panel      Microalbumin / creatinine urine ratio      Vit D  25 hydroxy (rtn osteoporosis monitoring)    Other Visit Diagnoses   Screening for breast  cancer        Relevant Orders       MM Digital Screening        Return in about 4 weeks (around 03/23/2014) for Recheck of Blood Pressure.

## 2014-02-23 NOTE — Addendum Note (Signed)
Addended by: Ronna PolioWALKER, Ardene Remley A on: 02/23/2014 08:20 PM   Modules accepted: Orders

## 2014-02-23 NOTE — Assessment & Plan Note (Signed)
BP Readings from Last 3 Encounters:  02/23/14 220/110  04/20/13 200/110  04/12/13 190/118   BP elevated today, however pt has long h/o HTN when measured in the office, typically normal at home. Will have her stop by for nurse recheck later this week. She will also RTC in 4 weeks for recheck. She will check BP at home and email or call with readings. Continue Metoprolol. Consider amb BP monitoring if persistent elevation of BP in the office.

## 2014-02-23 NOTE — Assessment & Plan Note (Signed)
Recent worsening symptoms of depression after death her her dog. Offered support today. Will increase Sertraline to 50mg  daily. Follow up 4 weeks and prn.

## 2014-02-23 NOTE — Telephone Encounter (Signed)
Relevant patient education assigned to patient using Emmi. ° °

## 2014-02-24 LAB — VITAMIN D 25 HYDROXY (VIT D DEFICIENCY, FRACTURES): Vit D, 25-Hydroxy: 26 ng/mL — ABNORMAL LOW (ref 30–89)

## 2014-02-25 ENCOUNTER — Encounter: Payer: Self-pay | Admitting: *Deleted

## 2014-04-11 ENCOUNTER — Encounter: Payer: Self-pay | Admitting: Internal Medicine

## 2014-04-11 NOTE — Telephone Encounter (Signed)
FYI-looks like it was a call from you?

## 2014-11-22 ENCOUNTER — Other Ambulatory Visit: Payer: Self-pay | Admitting: Internal Medicine

## 2015-02-21 ENCOUNTER — Other Ambulatory Visit: Payer: Self-pay | Admitting: Internal Medicine

## 2015-02-22 ENCOUNTER — Other Ambulatory Visit: Payer: Self-pay | Admitting: Internal Medicine

## 2015-03-18 ENCOUNTER — Other Ambulatory Visit: Payer: Self-pay | Admitting: Internal Medicine

## 2015-04-06 ENCOUNTER — Encounter: Payer: Medicare PPO | Admitting: Internal Medicine

## 2015-06-01 ENCOUNTER — Encounter: Payer: Self-pay | Admitting: Internal Medicine

## 2015-06-01 ENCOUNTER — Telehealth: Payer: Self-pay

## 2015-06-01 ENCOUNTER — Ambulatory Visit (INDEPENDENT_AMBULATORY_CARE_PROVIDER_SITE_OTHER): Payer: Medicare PPO | Admitting: Internal Medicine

## 2015-06-01 VITALS — BP 162/90 | HR 79 | Temp 97.4°F | Ht 63.5 in | Wt 129.0 lb

## 2015-06-01 DIAGNOSIS — Z0000 Encounter for general adult medical examination without abnormal findings: Secondary | ICD-10-CM

## 2015-06-01 DIAGNOSIS — I10 Essential (primary) hypertension: Secondary | ICD-10-CM

## 2015-06-01 DIAGNOSIS — N8110 Cystocele, unspecified: Secondary | ICD-10-CM

## 2015-06-01 LAB — COMPREHENSIVE METABOLIC PANEL
ALK PHOS: 243 U/L — AB (ref 39–117)
ALT: 72 U/L — ABNORMAL HIGH (ref 0–35)
AST: 127 U/L — AB (ref 0–37)
Albumin: 3.3 g/dL — ABNORMAL LOW (ref 3.5–5.2)
BILIRUBIN TOTAL: 2 mg/dL — AB (ref 0.2–1.2)
BUN: 14 mg/dL (ref 6–23)
CO2: 26 mEq/L (ref 19–32)
Calcium: 9.3 mg/dL (ref 8.4–10.5)
Chloride: 104 mEq/L (ref 96–112)
Creatinine, Ser: 0.8 mg/dL (ref 0.40–1.20)
GFR: 74.65 mL/min (ref >60.00)
GLUCOSE: 125 mg/dL — AB (ref 70–99)
POTASSIUM: 4.3 meq/L (ref 3.5–5.1)
Sodium: 137 mEq/L (ref 135–145)
Total Protein: 8.4 g/dL — ABNORMAL HIGH (ref 6.0–8.3)

## 2015-06-01 LAB — MICROALBUMIN / CREATININE URINE RATIO
Creatinine,U: 154 mg/dL
MICROALB/CREAT RATIO: 0.5 mg/g (ref 0.0–30.0)

## 2015-06-01 LAB — LIPID PANEL
CHOL/HDL RATIO: 4
CHOLESTEROL: 208 mg/dL — AB (ref 0–200)
HDL: 47.8 mg/dL (ref >39.00)
LDL CALC: 135 mg/dL — AB (ref 0–99)
NonHDL: 160.14
TRIGLYCERIDES: 126 mg/dL (ref 0.0–149.0)
VLDL: 25.2 mg/dL (ref 0.0–40.0)

## 2015-06-01 LAB — HM MAMMOGRAPHY

## 2015-06-01 MED ORDER — AMLODIPINE BESYLATE 5 MG PO TABS: 5.0000 mg | ORAL_TABLET | Freq: Every day | ORAL | Status: DC

## 2015-06-01 NOTE — Assessment & Plan Note (Signed)
General medical exam normal today. Declines breast exam. Labs as ordered. Declines pneumovax and prevnar. Will request Cologuard results. Encouraged healthy diet and exercise.

## 2015-06-01 NOTE — Progress Notes (Signed)
Subjective:    Patient ID: Susan Greer, female    DOB: 15-Feb-1941, 74 y.o.   MRN: [id number]  HPI  The patient is here for annual Medicare Wellness Examination and management of other chronic and acute problems.   The risk factors are reflected in the history.  The roster of all physicians providing medical care to patient - is listed in the Snapshot section of the chart.  Activities of daily living:   The patient is 100% independent in all ADLs: dressing, toileting, feeding as well as independent mobility. Patient lives alone in [place]. Son lives across the road.  Home safety :  The patient has smoke detectors in the home.  They wear seatbelts in their car. There are no firearms at home.  There is no violence in the home. They feel safe where they live.  Infectious Risks: There is no risks for hepatitis, STDs or HIV.  There is no  history of blood transfusion.  They have no travel history to infectious disease endemic areas of the world.  Additional Health Care Providers: The patient has seen their dentist in the last six months. Dentist - Dr. Axel Filler They have seen their eye doctor in the last year. Opthalmologist - Dr. Lew Dawes, [facility] They deny hearing issues. They have deferred audiologic testing in the last year.   They do not  have excessive sun exposure. Discussed the need for sun protection: hats,long sleeves and use of sunscreen if there is significant sun exposure.  Dermatologist - Dr. Orson Aloe, will change to Dr. Odis Luster Cardiology - Dr. Lady Gary  Diet: the importance of a healthy diet is discussed. They do have a healthy diet.  The benefits of regular aerobic exercise were discussed. Patient exercises by gardening.  Depression screen: there are no signs or vegative symptoms of depression- irritability, change in appetite, anhedonia, sadness/tearfullness.  Cognitive assessment: the patient manages all their financial and personal affairs and is actively  engaged.   HCPOA - sons. Kathrynn Speed and Lucienne Capers  The following portions of the patient's history were reviewed and updated as appropriate: allergies, current medications, past family history, past medical history,  past surgical history, past social history and problem list.  Visual acuity was not assessed per patient preference as they have regular follow up with their ophthalmologist. Hearing and body mass index were assessed and reviewed.   During the course of the visit the patient was educated and counseled about appropriate screening and preventive services including : fall prevention , diabetes screening, nutrition counseling, colorectal cancer screening, and recommended immunizations.     ACUTE ISSUES: HTN - BP 135/80 at home. Compliant with medication. No HA, chest pain. Notes that BP often high at Dr visits. Has not recently seen her cardiologist.  Prolapsed bladder - Feels that symptoms have worsened. Notes pressure and feels like she has a "tennis ball" between legs. Would like referral to GYN.  BP Readings from Last 3 Encounters:  06/01/15 162/90  02/23/14 220/110  04/20/13 200/110    Past Medical History  Diagnosis Date  . GERD (gastroesophageal reflux disease)   . SVT (supraventricular tachycardia)     Dr. Lady Gary at [facility], s/p adenosine  . Hypertension   . Kidney stones   . UTI (urinary tract infection)    Family History  Problem Relation Age of Onset  . Hypertension Mother   . Cancer Mother 49    breast  . Hypertension Father   . Pneumonia Father   . Diabetes  Brother   . Hypertension Brother   . Cancer Sister 13    breast   Past Surgical History  Procedure Laterality Date  . Abdominal hysterectomy      Dr. Weston Anna, for fibroid tumor and endometriosis   Social History   Social History  . Marital Status: Widowed    Spouse Name: N/A  . Number of Children: N/A  . Years of Education: N/A   Social History Main Topics  . Smoking status:  Never Smoker   . Smokeless tobacco: Never Used  . Alcohol Use: No  . Drug Use: No  . Sexual Activity: Not Asked   Other Topics Concern  . None   Social History Narrative   Lives in [place]. Widow. 2 sons.      Work - Retired, Medical laboratory scientific officer      Diet - regular      Exercise - outside [employer], lives [place] acres    Review of Systems  Constitutional: Negative for fever, chills, appetite change, fatigue and unexpected weight change.  Eyes: Negative for visual disturbance.  Respiratory: Negative for chest tightness, shortness of breath and wheezing.   Cardiovascular: Negative for chest pain, palpitations and leg swelling.  Gastrointestinal: Negative for nausea, vomiting, abdominal pain, diarrhea and constipation.  Genitourinary: Positive for difficulty urinating and pelvic pain. Negative for urgency, frequency, hematuria, decreased urine volume, genital sores and vaginal pain.  Musculoskeletal: Negative for myalgias and arthralgias.  Skin: Negative for color change and rash.  Neurological: Negative for dizziness, syncope, weakness, numbness and headaches.  Hematological: Negative for adenopathy. Does not bruise/bleed easily.  Psychiatric/Behavioral: Negative for sleep disturbance and dysphoric mood. The patient is not nervous/anxious.        Objective:    BP 162/90 mmHg  Pulse 79  Temp(Src) 97.4 F (36.3 C) (Oral)  Ht 5' 3.5" (1.613 m)  Wt 129 lb (58.514 kg)  BMI 22.49 kg/m2  SpO2 97% Physical Exam  Constitutional: She is oriented to person, place, and time. She appears well-developed and well-nourished. No distress.  HENT:  Head: Normocephalic and atraumatic.  Right Ear: External ear normal.  Left Ear: External ear normal.  Nose: Nose normal.  Mouth/Throat: Oropharynx is clear and moist. No oropharyngeal exudate.  Eyes: Conjunctivae and EOM are normal. Pupils are equal, round, and reactive to light. Right eye exhibits no discharge.  Neck: Normal range of motion. Neck  supple. No thyromegaly present.  Cardiovascular: Normal rate, regular rhythm, normal heart sounds and intact distal pulses.  Exam reveals no gallop and no friction rub.   No murmur heard. Pulmonary/Chest: Effort normal. No accessory muscle usage. No respiratory distress. She has no decreased breath sounds. She has no wheezes. She has no rhonchi. She has no rales.  Abdominal: Soft. Bowel sounds are normal. She exhibits no distension and no mass. There is no tenderness. There is no rebound and no guarding.  Musculoskeletal: Normal range of motion. She exhibits no edema or tenderness.  Lymphadenopathy:    She has no cervical adenopathy.  Neurological: She is alert and oriented to person, place, and time. No cranial nerve deficit. Coordination normal.  Skin: Skin is warm and dry. No rash noted. She is not diaphoretic. No erythema. No pallor.  Psychiatric: She has a normal mood and affect. Her behavior is normal. Judgment and thought content normal.          Assessment & Plan:  Patient was given a handout regarding current recommendations for health maintenance and preventative care on  the AVS.  Problem List Items Addressed This Visit      Unprioritized   Essential hypertension, benign    BP Readings from Last 3 Encounters:  06/01/15 162/90  02/23/14 220/110  04/20/13 200/110   BP markedly elevated, however she reports better controlled at home. Will add Amlodipine 5mg  daily. Recheck BP next week. Renal function with labs. Encouraged follow up with her cardiologist.      Relevant Medications   amLODipine (NORVASC) 5 MG tablet   Other Relevant Orders   Comprehensive metabolic panel   Lipid panel   Microalbumin / creatinine urine ratio   Female bladder prolapse    Pt complains of worsening symptoms. Will set up Urogynecology evaluation. She declines exam today.      Relevant Orders   Ambulatory referral to Urogynecology   Medicare annual wellness visit, subsequent - Primary     General medical exam normal today. Declines breast exam. Labs as ordered. Declines pneumovax and prevnar. Will request Cologuard results. Encouraged healthy diet and exercise.          Return in about 1 week (around 06/08/2015) for Recheck of Blood Pressure.

## 2015-06-01 NOTE — Patient Instructions (Addendum)
Start Amlodipine 54m daily to help with blood pressure. Follow up in 1 week.  Health Maintenance Adopting a healthy lifestyle and getting preventive care can go a long way to promote health and wellness. Talk with your health care provider about what schedule of regular examinations is right for you. This is a good chance for you to check in with your provider about disease prevention and staying healthy. In between checkups, there are plenty of things you can do on your own. Experts have done a lot of research about which lifestyle changes and preventive measures are most likely to keep you healthy. Ask your health care provider for more information. WEIGHT AND DIET  Eat a healthy diet  Be sure to include plenty of vegetables, fruits, low-fat dairy products, and lean protein.  Do not eat a lot of foods high in solid fats, added sugars, or salt.  Get regular exercise. This is one of the most important things you can do for your health.  Most adults should exercise for at least 150 minutes each week. The exercise should increase your heart rate and make you sweat (moderate-intensity exercise).  Most adults should also do strengthening exercises at least twice a week. This is in addition to the moderate-intensity exercise.  Maintain a healthy weight  Body mass index (BMI) is a measurement that can be used to identify possible weight problems. It estimates body fat based on height and weight. Your health care provider can help determine your BMI and help you achieve or maintain a healthy weight.  For females 267years of age and older:   A BMI below 18.5 is considered underweight.  A BMI of 18.5 to 24.9 is normal.  A BMI of 25 to 29.9 is considered overweight.  A BMI of 30 and above is considered obese.  Watch levels of cholesterol and blood lipids  You should start having your blood tested for lipids and cholesterol at 73years of age, then have this test every 5 years.  You may  need to have your cholesterol levels checked more often if:  Your lipid or cholesterol levels are high.  You are older than 73years of age.  You are at high risk for heart disease.  CANCER SCREENING   Lung Cancer  Lung cancer screening is recommended for adults 553817years old who are at high risk for lung cancer because of a history of smoking.  A yearly low-dose CT scan of the lungs is recommended for people who:  Currently smoke.  Have quit within the past 15 years.  Have at least a 30-pack-year history of smoking. A pack year is smoking an average of one pack of cigarettes a day for 1 year.  Yearly screening should continue until it has been 15 years since you quit.  Yearly screening should stop if you develop a health problem that would prevent you from having lung cancer treatment.  Breast Cancer  Practice breast self-awareness. This means understanding how your breasts normally appear and feel.  It also means doing regular breast self-exams. Let your health care provider know about any changes, no matter how small.  If you are in your 20s or 30s, you should have a clinical breast exam (CBE) by a health care provider every 1-3 years as part of a regular health exam.  If you are 437or older, have a CBE every year. Also consider having a breast X-ray (mammogram) every year.  If you have a family history of  breast cancer, talk to your health care provider about genetic screening.  If you are at high risk for breast cancer, talk to your health care provider about having an MRI and a mammogram every year.  Breast cancer gene (BRCA) assessment is recommended for women who have family members with BRCA-related cancers. BRCA-related cancers include:  Breast.  Ovarian.  Tubal.  Peritoneal cancers.  Results of the assessment will determine the need for genetic counseling and BRCA1 and BRCA2 testing. Cervical Cancer Routine pelvic examinations to screen for cervical  cancer are no longer recommended for nonpregnant women who are considered low risk for cancer of the pelvic organs (ovaries, uterus, and vagina) and who do not have symptoms. A pelvic examination may be necessary if you have symptoms including those associated with pelvic infections. Ask your health care provider if a screening pelvic exam is right for you.   The Pap test is the screening test for cervical cancer for women who are considered at risk.  If you had a hysterectomy for a problem that was not cancer or a condition that could lead to cancer, then you no longer need Pap tests.  If you are older than 65 years, and you have had normal Pap tests for the past 10 years, you no longer need to have Pap tests.  If you have had past treatment for cervical cancer or a condition that could lead to cancer, you need Pap tests and screening for cancer for at least 20 years after your treatment.  If you no longer get a Pap test, assess your risk factors if they change (such as having a new sexual partner). This can affect whether you should start being screened again.  Some women have medical problems that increase their chance of getting cervical cancer. If this is the case for you, your health care provider may recommend more frequent screening and Pap tests.  The human papillomavirus (HPV) test is another test that may be used for cervical cancer screening. The HPV test looks for the virus that can cause cell changes in the cervix. The cells collected during the Pap test can be tested for HPV.  The HPV test can be used to screen women 48 years of age and older. Getting tested for HPV can extend the interval between normal Pap tests from three to five years.  An HPV test also should be used to screen women of any age who have unclear Pap test results.  After 73 years of age, women should have HPV testing as often as Pap tests.  Colorectal Cancer  This type of cancer can be detected and often  prevented.  Routine colorectal cancer screening usually begins at 73 years of age and continues through 73 years of age.  Your health care provider may recommend screening at an earlier age if you have risk factors for colon cancer.  Your health care provider may also recommend using home test kits to check for hidden blood in the stool.  A small camera at the end of a tube can be used to examine your colon directly (sigmoidoscopy or colonoscopy). This is done to check for the earliest forms of colorectal cancer.  Routine screening usually begins at age 50.  Direct examination of the colon should be repeated every 5-10 years through 73 years of age. However, you may need to be screened more often if early forms of precancerous polyps or small growths are found. Skin Cancer  Check your skin from head to  toe regularly.  Tell your health care provider about any new moles or changes in moles, especially if there is a change in a mole's shape or color.  Also tell your health care provider if you have a mole that is larger than the size of a pencil eraser.  Always use sunscreen. Apply sunscreen liberally and repeatedly throughout the day.  Protect yourself by wearing long sleeves, pants, a wide-brimmed hat, and sunglasses whenever you are outside. HEART DISEASE, DIABETES, AND HIGH BLOOD PRESSURE   Have your blood pressure checked at least every 1-2 years. High blood pressure causes heart disease and increases the risk of stroke.  If you are between 12 years and 60 years old, ask your health care provider if you should take aspirin to prevent strokes.  Have regular diabetes screenings. This involves taking a blood sample to check your fasting blood sugar level.  If you are at a normal weight and have a low risk for diabetes, have this test once every three years after 73 years of age.  If you are overweight and have a high risk for diabetes, consider being tested at a younger age or more  often. PREVENTING INFECTION  Hepatitis B  If you have a higher risk for hepatitis B, you should be screened for this virus. You are considered at high risk for hepatitis B if:  You were born in a country where hepatitis B is common. Ask your health care provider which countries are considered high risk.  Your parents were born in a high-risk country, and you have not been immunized against hepatitis B (hepatitis B vaccine).  You have HIV or AIDS.  You use needles to inject street drugs.  You live with someone who has hepatitis B.  You have had sex with someone who has hepatitis B.  You get hemodialysis treatment.  You take certain medicines for conditions, including cancer, organ transplantation, and autoimmune conditions. Hepatitis C  Blood testing is recommended for:  Everyone born from [birth year] through 1964.  Anyone with known risk factors for hepatitis C. Sexually transmitted infections (STIs)  You should be screened for sexually transmitted infections (STIs) including gonorrhea and chlamydia if:  You are sexually active and are younger than 73 years of age.  You are older than 73 years of age and your health care provider tells you that you are at risk for this type of infection.  Your sexual activity has changed since you were last screened and you are at an increased risk for chlamydia or gonorrhea. Ask your health care provider if you are at risk.  If you do not have HIV, but are at risk, it may be recommended that you take a prescription medicine daily to prevent HIV infection. This is called pre-exposure prophylaxis (PrEP). You are considered at risk if:  You are sexually active and do not regularly use condoms or know the HIV status of your partner(s).  You take drugs by injection.  You are sexually active with a partner who has HIV. Talk with your health care provider about whether you are at high risk of being infected with HIV. If you choose to begin PrEP, you  should first be tested for HIV. You should then be tested every 3 months for as long as you are taking PrEP.  PREGNANCY   If you are premenopausal and you may become pregnant, ask your health care provider about preconception counseling.  If you may become pregnant, take 400 to 800 micrograms (mcg)  of folic acid every day.  If you want to prevent pregnancy, talk to your health care provider about birth control (contraception). OSTEOPOROSIS AND MENOPAUSE   Osteoporosis is a disease in which the bones lose minerals and strength with aging. This can result in serious bone fractures. Your risk for osteoporosis can be identified using a bone density scan.  If you are 68 years of age or older, or if you are at risk for osteoporosis and fractures, ask your health care provider if you should be screened.  Ask your health care provider whether you should take a calcium or vitamin D supplement to lower your risk for osteoporosis.  Menopause may have certain physical symptoms and risks.  Hormone replacement therapy may reduce some of these symptoms and risks. Talk to your health care provider about whether hormone replacement therapy is right for you.  HOME CARE INSTRUCTIONS   Schedule regular health, dental, and eye exams.  Stay current with your immunizations.   Do not use any tobacco products including cigarettes, chewing tobacco, or electronic cigarettes.  If you are pregnant, do not drink alcohol.  If you are breastfeeding, limit how much and how often you drink alcohol.  Limit alcohol intake to no more than 1 drink per day for nonpregnant women. One drink equals 12 ounces of beer, 5 ounces of wine, or 1 ounces of hard liquor.  Do not use street drugs.  Do not share needles.  Ask your health care provider for help if you need support or information about quitting drugs.  Tell your health care provider if you often feel depressed.  Tell your health care provider if you have ever  been abused or do not feel safe at home. Document Released: 04/22/2011 Document Revised: 02/21/2014 Document Reviewed: 09/08/2013 [facility] Patient Information 2015 [place], [place]. This information is not intended to replace advice given to you by your health care provider. Make sure you discuss any questions you have with your health care provider.

## 2015-06-01 NOTE — Assessment & Plan Note (Signed)
BP Readings from Last 3 Encounters:  06/01/15 162/90  02/23/14 220/110  04/20/13 200/110   BP markedly elevated, however she reports better controlled at home. Will add Amlodipine  daily. Recheck BP next week. Renal function with labs. Encouraged follow up with her cardiologist.

## 2015-06-01 NOTE — Progress Notes (Signed)
Pre visit review using our clinic review tool, if applicable. No additional management support is needed unless otherwise documented below in the visit note. 

## 2015-06-01 NOTE — Assessment & Plan Note (Signed)
Pt complains of worsening symptoms. Will set up Urogynecology evaluation. She declines exam today.

## 2015-06-05 NOTE — Telephone Encounter (Signed)
Error

## 2015-06-23 ENCOUNTER — Other Ambulatory Visit: Payer: Self-pay | Admitting: Internal Medicine

## 2015-06-23 NOTE — Telephone Encounter (Signed)
Last OV 8.11.16.  Please advise refill 

## 2015-06-24 ENCOUNTER — Other Ambulatory Visit: Payer: Self-pay | Admitting: Internal Medicine

## 2015-06-25 NOTE — Telephone Encounter (Signed)
Last OV 8.11.16.  Please advise refill 

## 2015-08-01 ENCOUNTER — Other Ambulatory Visit: Payer: Self-pay | Admitting: Internal Medicine

## 2015-08-02 ENCOUNTER — Other Ambulatory Visit: Payer: Self-pay | Admitting: *Deleted

## 2015-08-02 ENCOUNTER — Other Ambulatory Visit: Payer: Self-pay

## 2015-08-02 ENCOUNTER — Telehealth: Payer: Self-pay | Admitting: *Deleted

## 2015-08-02 MED ORDER — TOPROL XL 200 MG PO TB24: 200.0000 mg | ORAL_TABLET | Freq: Every day | ORAL | Status: DC

## 2015-08-02 NOTE — Telephone Encounter (Signed)
Refilled as requested  

## 2015-08-02 NOTE — Telephone Encounter (Signed)
Patient has requested a medication refill for Toprol XL, -thanks

## 2015-08-25 ENCOUNTER — Other Ambulatory Visit: Payer: Self-pay | Admitting: Internal Medicine

## 2015-08-28 NOTE — Telephone Encounter (Signed)
Last OV 06/01/15, Last refill 02/23/14 #180 with 1 refill... Okay to refill?

## 2015-09-25 ENCOUNTER — Other Ambulatory Visit: Payer: Self-pay | Admitting: Internal Medicine

## 2015-12-23 ENCOUNTER — Telehealth: Payer: Self-pay | Admitting: Internal Medicine

## 2015-12-25 NOTE — Telephone Encounter (Signed)
Pt called to follow up on her prescription. Pharmacy is [facility] - [place], [place] - [address] Call pt @ [phone]. Thank you!

## 2015-12-26 NOTE — Telephone Encounter (Signed)
[id number]

## 2015-12-26 NOTE — Telephone Encounter (Signed)
Pt called for a refill for her zoloft 50mg  back on 09/26/15 and it was approved by Demetrice Graham,RMA but the script was never received by the pharmacy which I called to confirm with the pharmacy.i gave them a verbal order for the original Rx that was approved for 09/26/15. I also stated to the pt that she needs to make a f/u with Dr. Dan HumphreysWalker which was scheduled 01/01/16 at 2:30pm and I explained to the pt if she does not keep her appt that the rest of her refills will be cancelled until she follows up with the doctor.

## 2016-01-01 ENCOUNTER — Ambulatory Visit: Payer: Medicare PPO | Admitting: Internal Medicine

## 2016-01-11 ENCOUNTER — Ambulatory Visit (INDEPENDENT_AMBULATORY_CARE_PROVIDER_SITE_OTHER): Payer: Medicare Other | Admitting: Internal Medicine

## 2016-01-11 ENCOUNTER — Encounter: Payer: Self-pay | Admitting: Internal Medicine

## 2016-01-11 VITALS — BP 150/77 | HR 76 | Temp 97.7°F | Ht 64.0 in | Wt 128.1 lb

## 2016-01-11 DIAGNOSIS — R945 Abnormal results of liver function studies: Secondary | ICD-10-CM

## 2016-01-11 DIAGNOSIS — F411 Generalized anxiety disorder: Secondary | ICD-10-CM

## 2016-01-11 DIAGNOSIS — N8110 Cystocele, unspecified: Secondary | ICD-10-CM

## 2016-01-11 DIAGNOSIS — R7989 Other specified abnormal findings of blood chemistry: Secondary | ICD-10-CM

## 2016-01-11 DIAGNOSIS — I10 Essential (primary) hypertension: Secondary | ICD-10-CM | POA: Diagnosis not present

## 2016-01-11 DIAGNOSIS — Z23 Encounter for immunization: Secondary | ICD-10-CM

## 2016-01-11 DIAGNOSIS — H3530 Unspecified macular degeneration: Secondary | ICD-10-CM

## 2016-01-11 LAB — CBC
HCT: 42.9 % (ref 36.0–46.0)
Hemoglobin: 14.6 g/dL (ref 12.0–15.0)
MCHC: 34.1 g/dL (ref 30.0–36.0)
MCV: 95.6 fl (ref 78.0–100.0)
Platelets: 121 10*3/uL — ABNORMAL LOW (ref 150.0–400.0)
RBC: 4.49 Mil/uL (ref 3.87–5.11)
RDW: 15 % (ref 11.5–15.5)
WBC: 6.5 10*3/uL (ref 4.0–10.5)

## 2016-01-11 LAB — COMPREHENSIVE METABOLIC PANEL
ALT: 74 U/L — ABNORMAL HIGH (ref 0–35)
AST: 117 U/L — ABNORMAL HIGH (ref 0–37)
Albumin: 3.3 g/dL — ABNORMAL LOW (ref 3.5–5.2)
Alkaline Phosphatase: 279 U/L — ABNORMAL HIGH (ref 39–117)
BILIRUBIN TOTAL: 1.6 mg/dL — AB (ref 0.2–1.2)
BUN: 15 mg/dL (ref 6–23)
CALCIUM: 8.8 mg/dL (ref 8.4–10.5)
CHLORIDE: 109 meq/L (ref 96–112)
CO2: 26 meq/L (ref 19–32)
Creatinine, Ser: 0.7 mg/dL (ref 0.40–1.20)
GFR: 86.94 mL/min (ref >60.00)
Glucose, Bld: 109 mg/dL — ABNORMAL HIGH (ref 70–99)
POTASSIUM: 4.1 meq/L (ref 3.5–5.1)
Sodium: 141 mEq/L (ref 135–145)
Total Protein: 8.2 g/dL (ref 6.0–8.3)

## 2016-01-11 LAB — HM MAMMOGRAPHY

## 2016-01-11 NOTE — Assessment & Plan Note (Signed)
S/p evaluation with GYN and using a pessary. Tolerating well. Will continue to monitor.

## 2016-01-11 NOTE — Progress Notes (Signed)
Pre visit review using our clinic review tool, if applicable. No additional management support is needed unless otherwise documented below in the visit note. 

## 2016-01-11 NOTE — Progress Notes (Signed)
Subjective:    Patient ID: Susan Greer, female    DOB: [birth year], 75 y.o.   MRN: [id number]  HPI  [age]YO female presents for follow up.  Last seen 05/2015. Noted to have elevated LFTs. Recommended GI evaluation. Never followed through on this.  Generally feeling well. Reports compliant with medication. Feeling less anxious in crowds and things on Sertraline.  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 Undergoing injections at [facility]. Taking OTC vitamin.  Also seen by GYN for vaginal prolapse. Started using a pessary. Removes for BM and at night. Tolerating well.   Wt Readings from Last 3 Encounters:  01/11/16 128 lb 2 oz (58.117 kg)  06/01/15 129 lb (58.514 kg)  02/23/14 122 lb (55.339 kg)   BP Readings from Last 3 Encounters:  01/11/16 150/77  06/01/15 162/90  02/23/14 220/110    Past Medical History  Diagnosis Date  . GERD (gastroesophageal reflux disease)   . SVT (supraventricular tachycardia) (HCC)     Dr. Lady GaryFath at [facility], s/p adenosine  . Hypertension   . Kidney stones   . UTI (urinary tract infection)    Family History  Problem Relation Age of Onset  . Hypertension Mother   . Cancer Mother [id number]    breast  . Hypertension Father   . Pneumonia Father   . Diabetes Brother   . Hypertension Brother   . Cancer Sister [id number]    breast   Past Surgical History  Procedure Laterality Date  . Abdominal hysterectomy      Dr. Weston AnnaEllington, for fibroid tumor and endometriosis   Social History   Social History  . Marital Status: Widowed    Spouse Name: N/A  . Number of Children: N/A  . Years of Education: N/A   Social History Main Topics  . Smoking status: Never Smoker   . Smokeless tobacco: Never Used  . Alcohol Use: No  . Drug Use: No  . Sexual Activity: Not Asked   Other Topics Concern  . None   Social History Narrative   [place] Widow. 2 sons.      Work - Retired, Medical laboratory scientific officerschool admin      Diet - regular      Exercise -  outside [employer], [place] acres    Review of Systems  Constitutional: Negative for fever, chills, appetite change, fatigue and unexpected weight change.  Eyes: Negative for visual disturbance.  Respiratory: Negative for shortness of breath.   Cardiovascular: Negative for chest pain and leg swelling.  Gastrointestinal: Negative for nausea, vomiting, abdominal pain, diarrhea and constipation.  Musculoskeletal: Negative for myalgias and arthralgias.  Skin: Negative for color change and rash.  Hematological: Negative for adenopathy. Does not bruise/bleed easily.  Psychiatric/Behavioral: Negative for sleep disturbance and dysphoric mood. The patient is not nervous/anxious.        Objective:    BP 150/77 mmHg  Pulse 76  Temp(Src) 97.7 F (36.5 C) (Oral)  Ht 5\' 4"  (1.626 m)  Wt 128 lb 2 oz (58.117 kg)  BMI 21.98 kg/m2  SpO2 98% Physical Exam  Constitutional: She is oriented to person, place, and time. She appears well-developed and well-nourished. No distress.  HENT:  Head: Normocephalic and atraumatic.  Right Ear: External ear normal.  Left Ear: External ear normal.  Nose: Nose normal.  Mouth/Throat: Oropharynx is clear and moist. No oropharyngeal exudate.  Eyes: Conjunctivae are normal. Pupils are equal, round, and reactive to light. Right eye exhibits no discharge. Left  eye exhibits no discharge. No scleral icterus.  Neck: Normal range of motion. Neck supple. No tracheal deviation present. No thyromegaly present.  Cardiovascular: Normal rate, regular rhythm, normal heart sounds and intact distal pulses.  Exam reveals no gallop and no friction rub.   No murmur heard. Pulmonary/Chest: Effort normal and breath sounds normal. No respiratory distress. She has no wheezes. She has no rales. She exhibits no tenderness.  Musculoskeletal: Normal range of motion. She exhibits no edema or tenderness.  Lymphadenopathy:    She has no cervical adenopathy.  Neurological: She is alert and  oriented to person, place, and time. No cranial nerve deficit. She exhibits normal muscle tone. Coordination normal.  Skin: Skin is warm and dry. No rash noted. She is not diaphoretic. No erythema. No pallor.  Psychiatric: She has a normal mood and affect. Her behavior is normal. Judgment and thought content normal.          Assessment & Plan:   Problem List Items Addressed This Visit      Unprioritized   Anxiety state    Symptoms improved with Sertraline. Will continue.      Elevated LFTs    Elevated LFTs chronic. Reviewed previous workup, including workup for Hemachromatosis which was normal. Will repeat LFTs today.      Relevant Orders   Comprehensive metabolic panel   Ferritin   CBC   Essential hypertension, benign - Primary    BP Readings from Last 3 Encounters:  01/11/16 150/77  06/01/15 162/90  02/23/14 220/110   BP improved today. Recheck renal function with labs. Continue current medication.      Macular degeneration, left eye    New diagnosis of macular degeneration. Followed at [facility]. Will monitor.      Vaginal prolapse    S/p evaluation with GYN and using a pessary. Tolerating well. Will continue to monitor.          Return in about 3 months (around 04/12/2016) for Recheck.  Ronna Polio, MD Internal Medicine [facility]

## 2016-01-11 NOTE — Assessment & Plan Note (Signed)
Symptoms improved with Sertraline. Will continue. 

## 2016-01-11 NOTE — Patient Instructions (Signed)
Labs today.   Follow up in 3 months.  

## 2016-01-11 NOTE — Assessment & Plan Note (Signed)
New diagnosis of macular degeneration. Followed at [facility]. Will monitor.

## 2016-01-11 NOTE — Assessment & Plan Note (Signed)
BP Readings from Last 3 Encounters:  01/11/16 150/77  06/01/15 162/90  02/23/14 220/110   BP improved today. Recheck renal function with labs. Continue current medication.

## 2016-01-11 NOTE — Assessment & Plan Note (Signed)
Elevated LFTs chronic. Reviewed previous workup, including workup for Hemachromatosis which was normal. Will repeat LFTs today.

## 2016-01-12 LAB — FERRITIN: Ferritin: 589.8 ng/mL — ABNORMAL HIGH (ref 10.0–291.0)

## 2016-01-18 ENCOUNTER — Other Ambulatory Visit: Payer: Self-pay | Admitting: Internal Medicine

## 2016-01-18 ENCOUNTER — Encounter: Payer: Self-pay | Admitting: Internal Medicine

## 2016-01-18 DIAGNOSIS — R945 Abnormal results of liver function studies: Principal | ICD-10-CM

## 2016-01-18 DIAGNOSIS — R7989 Other specified abnormal findings of blood chemistry: Secondary | ICD-10-CM

## 2016-02-06 ENCOUNTER — Other Ambulatory Visit: Payer: Self-pay | Admitting: Student

## 2016-02-06 DIAGNOSIS — R7989 Other specified abnormal findings of blood chemistry: Secondary | ICD-10-CM

## 2016-02-06 DIAGNOSIS — K760 Fatty (change of) liver, not elsewhere classified: Secondary | ICD-10-CM

## 2016-02-06 DIAGNOSIS — R945 Abnormal results of liver function studies: Secondary | ICD-10-CM

## 2016-02-12 ENCOUNTER — Ambulatory Visit: Payer: Medicare Other

## 2016-04-29 ENCOUNTER — Ambulatory Visit: Payer: Medicare Other | Admitting: Internal Medicine

## 2016-05-01 ENCOUNTER — Ambulatory Visit
Admission: RE | Admit: 2016-05-01 | Discharge: 2016-05-01 | Disposition: A | Discharge status: Home / Self Care | Payer: Medicare Other | Source: Ambulatory Visit | Attending: Student | Admitting: Student

## 2016-05-01 DIAGNOSIS — K769 Liver disease, unspecified: Secondary | ICD-10-CM | POA: Insufficient documentation

## 2016-05-01 DIAGNOSIS — K829 Disease of gallbladder, unspecified: Secondary | ICD-10-CM | POA: Diagnosis not present

## 2016-05-01 DIAGNOSIS — R945 Abnormal results of liver function studies: Secondary | ICD-10-CM | POA: Insufficient documentation

## 2016-05-01 DIAGNOSIS — R7989 Other specified abnormal findings of blood chemistry: Secondary | ICD-10-CM

## 2016-05-01 DIAGNOSIS — K760 Fatty (change of) liver, not elsewhere classified: Secondary | ICD-10-CM | POA: Insufficient documentation

## 2016-05-08 ENCOUNTER — Other Ambulatory Visit: Payer: Self-pay | Admitting: Internal Medicine

## 2016-05-27 ENCOUNTER — Other Ambulatory Visit: Payer: Self-pay | Admitting: Student

## 2016-05-27 DIAGNOSIS — R932 Abnormal findings on diagnostic imaging of liver and biliary tract: Secondary | ICD-10-CM

## 2016-05-30 ENCOUNTER — Ambulatory Visit
Admission: RE | Admit: 2016-05-30 | Discharge: 2016-05-30 | Disposition: A | Discharge status: Home / Self Care | Payer: Medicare Other | Source: Ambulatory Visit | Attending: Student | Admitting: Student

## 2016-05-30 DIAGNOSIS — R932 Abnormal findings on diagnostic imaging of liver and biliary tract: Secondary | ICD-10-CM | POA: Insufficient documentation

## 2016-05-30 DIAGNOSIS — K760 Fatty (change of) liver, not elsewhere classified: Secondary | ICD-10-CM | POA: Insufficient documentation

## 2016-05-30 DIAGNOSIS — K7460 Unspecified cirrhosis of liver: Secondary | ICD-10-CM | POA: Diagnosis not present

## 2016-06-03 ENCOUNTER — Telehealth: Payer: Self-pay | Admitting: Family

## 2016-06-03 ENCOUNTER — Encounter: Payer: Self-pay | Admitting: Family

## 2016-06-03 ENCOUNTER — Ambulatory Visit (INDEPENDENT_AMBULATORY_CARE_PROVIDER_SITE_OTHER): Payer: Medicare Other | Admitting: Family

## 2016-06-03 ENCOUNTER — Encounter: Payer: Medicare PPO | Admitting: Internal Medicine

## 2016-06-03 VITALS — BP 176/90 | HR 78 | Temp 98.1°F | Ht 63.0 in | Wt 123.0 lb

## 2016-06-03 DIAGNOSIS — F329 Major depressive disorder, single episode, unspecified: Secondary | ICD-10-CM | POA: Diagnosis not present

## 2016-06-03 DIAGNOSIS — Z8619 Personal history of other infectious and parasitic diseases: Secondary | ICD-10-CM | POA: Diagnosis not present

## 2016-06-03 DIAGNOSIS — Z0000 Encounter for general adult medical examination without abnormal findings: Secondary | ICD-10-CM | POA: Diagnosis not present

## 2016-06-03 DIAGNOSIS — I10 Essential (primary) hypertension: Secondary | ICD-10-CM | POA: Diagnosis not present

## 2016-06-03 DIAGNOSIS — R945 Abnormal results of liver function studies: Principal | ICD-10-CM

## 2016-06-03 DIAGNOSIS — K7460 Unspecified cirrhosis of liver: Secondary | ICD-10-CM | POA: Insufficient documentation

## 2016-06-03 DIAGNOSIS — R7989 Other specified abnormal findings of blood chemistry: Secondary | ICD-10-CM

## 2016-06-03 DIAGNOSIS — F32A Depression, unspecified: Secondary | ICD-10-CM

## 2016-06-03 LAB — CBC WITH DIFFERENTIAL/PLATELET
BASOS PCT: 1.6 % (ref 0.0–3.0)
Basophils Absolute: 0.1 10*3/uL (ref 0.0–0.1)
EOS PCT: 3.3 % (ref 0.0–5.0)
Eosinophils Absolute: 0.2 10*3/uL (ref 0.0–0.7)
HCT: 45.4 % (ref 36.0–46.0)
HEMOGLOBIN: 15.4 g/dL — AB (ref 12.0–15.0)
LYMPHS PCT: 25.3 % (ref 12.0–46.0)
Lymphs Abs: 1.8 10*3/uL (ref 0.7–4.0)
MCHC: 34 g/dL (ref 30.0–36.0)
MCV: 97.2 fl (ref 78.0–100.0)
MONO ABS: 0.4 10*3/uL (ref 0.1–1.0)
Monocytes Relative: 6.1 % (ref 3.0–12.0)
Neutro Abs: 4.5 10*3/uL (ref 1.4–7.7)
Neutrophils Relative %: 63.7 % (ref 43.0–77.0)
Platelets: 125 10*3/uL — ABNORMAL LOW (ref 150.0–400.0)
RBC: 4.67 Mil/uL (ref 3.87–5.11)
RDW: 15.3 % (ref 11.5–15.5)
WBC: 7.1 10*3/uL (ref 4.0–10.5)

## 2016-06-03 LAB — LIPID PANEL
CHOLESTEROL: 183 mg/dL (ref 0–200)
HDL: 54.4 mg/dL (ref >39.00)
LDL Cholesterol: 103 mg/dL — ABNORMAL HIGH (ref 0–99)
NONHDL: 128.42
TRIGLYCERIDES: 129 mg/dL (ref 0.0–149.0)
Total CHOL/HDL Ratio: 3
VLDL: 25.8 mg/dL (ref 0.0–40.0)

## 2016-06-03 LAB — COMPREHENSIVE METABOLIC PANEL
ALBUMIN: 3.4 g/dL — AB (ref 3.5–5.2)
ALK PHOS: 277 U/L — AB (ref 39–117)
ALT: 51 U/L — ABNORMAL HIGH (ref 0–35)
AST: 92 U/L — ABNORMAL HIGH (ref 0–37)
BUN: 12 mg/dL (ref 6–23)
CALCIUM: 9.3 mg/dL (ref 8.4–10.5)
CHLORIDE: 107 meq/L (ref 96–112)
CO2: 29 mEq/L (ref 19–32)
Creatinine, Ser: 0.7 mg/dL (ref 0.40–1.20)
GFR: 86.85 mL/min (ref >60.00)
Glucose, Bld: 111 mg/dL — ABNORMAL HIGH (ref 70–99)
POTASSIUM: 4.2 meq/L (ref 3.5–5.1)
SODIUM: 142 meq/L (ref 135–145)
TOTAL PROTEIN: 8.1 g/dL (ref 6.0–8.3)
Total Bilirubin: 1.8 mg/dL — ABNORMAL HIGH (ref 0.2–1.2)

## 2016-06-03 LAB — HEMOGLOBIN A1C: HEMOGLOBIN A1C: 5.6 % (ref 4.6–6.5)

## 2016-06-03 LAB — VITAMIN D 25 HYDROXY (VIT D DEFICIENCY, FRACTURES): VITD: 43.83 ng/mL (ref 30.00–100.00)

## 2016-06-03 MED ORDER — TOPROL XL 200 MG PO TB24: 200.0000 mg | ORAL_TABLET | Freq: Every day | ORAL | 9 refills | Status: DC

## 2016-06-03 MED ORDER — SERTRALINE HCL 50 MG PO TABS: 50.0000 mg | ORAL_TABLET | Freq: Every day | ORAL | 1 refills | Status: DC

## 2016-06-03 NOTE — Addendum Note (Signed)
Addended by: Elise BenneBOOTH, Mekesha Solomon T on: 06/03/2016 11:33 AM   Modules accepted: Orders

## 2016-06-03 NOTE — Assessment & Plan Note (Signed)
Patient is up-to-date on colonoscopy. She politely declines mammogram screenings anymore. She's had a hysterectomy and does not do Pap smears. She also politely declines doing a DEXA scan. She is due for tetanus and pneumococcal vaccine however patient politely declines those immunizations today as well. Screening labs today.

## 2016-06-03 NOTE — Telephone Encounter (Signed)
Please call patient  Lab work came back great today.  However as has been persistent, her liver function continues to be elevated although it is down slightly. I know she had US this spring that showed cirrhosis however I , like Dr. Dan HumphreysWalker, want her to be formally evaluated by GI. Referral placed and please urge her to go.

## 2016-06-03 NOTE — Assessment & Plan Note (Signed)
Elevated today. No signs or symptoms of hypertensive emergency or urgency. Patient states "this always happens when she is in the office'. Patient stated that she would call or my chart message to me today with her BP reading when she gets home.

## 2016-06-03 NOTE — Assessment & Plan Note (Addendum)
Resolved

## 2016-06-03 NOTE — Patient Instructions (Signed)
Pleasure meeting you.  Please call our message me with your blood pressure reading today.  Menopause is a normal process in which your reproductive ability comes to an end. This process happens gradually over a span of months to years, usually between the ages of 98 and 49. Menopause is complete when you have missed 12 consecutive menstrual periods. It is important to talk with your health care provider about some of the most common conditions that affect postmenopausal women, such as heart disease, cancer, and bone loss (osteoporosis). Adopting a healthy lifestyle and getting preventive care can help to promote your health and wellness. Those actions can also lower your chances of developing some of these common conditions. WHAT SHOULD I KNOW ABOUT MENOPAUSE? During menopause, you may experience a number of symptoms, such as:  Moderate-to-severe hot flashes.  Night sweats.  Decrease in sex drive.  Mood swings.  Headaches.  Tiredness.  Irritability.  Memory problems.  Insomnia. Choosing to treat or not to treat menopausal changes is an individual decision that you make with your health care provider. WHAT SHOULD I KNOW ABOUT HORMONE REPLACEMENT THERAPY AND SUPPLEMENTS? Hormone therapy products are effective for treating symptoms that are associated with menopause, such as hot flashes and night sweats. Hormone replacement carries certain risks, especially as you become older. If you are thinking about using estrogen or estrogen with progestin treatments, discuss the benefits and risks with your health care provider. WHAT SHOULD I KNOW ABOUT HEART DISEASE AND STROKE? Heart disease, heart attack, and stroke become more likely as you age. This may be due, in part, to the hormonal changes that your body experiences during menopause. These can affect how your body processes dietary fats, triglycerides, and cholesterol. Heart attack and stroke are both medical emergencies. There are many  things that you can do to help prevent heart disease and stroke:  Have your blood pressure checked at least every 1-2 years. High blood pressure causes heart disease and increases the risk of stroke.  If you are 24-72 years old, ask your health care provider if you should take aspirin to prevent a heart attack or a stroke.  Do not use any tobacco products, including cigarettes, chewing tobacco, or electronic cigarettes. If you need help quitting, ask your health care provider.  It is important to eat a healthy diet and maintain a healthy weight.  Be sure to include plenty of vegetables, fruits, low-fat dairy products, and lean protein.  Avoid eating foods that are high in solid fats, added sugars, or salt (sodium).  Get regular exercise. This is one of the most important things that you can do for your health.  Try to exercise for at least 150 minutes each week. The type of exercise that you do should increase your heart rate and make you sweat. This is known as moderate-intensity exercise.  Try to do strengthening exercises at least twice each week. Do these in addition to the moderate-intensity exercise.  Know your numbers.Ask your health care provider to check your cholesterol and your blood glucose. Continue to have your blood tested as directed by your health care provider. WHAT SHOULD I KNOW ABOUT CANCER SCREENING? There are several types of cancer. Take the following steps to reduce your risk and to catch any cancer development as early as possible. Breast Cancer  Practice breast self-awareness.  This means understanding how your breasts normally appear and feel.  It also means doing regular breast self-exams. Let your health care provider know about any changes,  no matter how small.  If you are 72 or older, have a clinician do a breast exam (clinical breast exam or CBE) every year. Depending on your age, family history, and medical history, it may be recommended that you also  have a yearly breast X-ray (mammogram).  If you have a family history of breast cancer, talk with your health care provider about genetic screening.  If you are at high risk for breast cancer, talk with your health care provider about having an MRI and a mammogram every year.  Breast cancer (BRCA) gene test is recommended for women who have family members with BRCA-related cancers. Results of the assessment will determine the need for genetic counseling and BRCA1 and for BRCA2 testing. BRCA-related cancers include these types:  Breast. This occurs in males or females.  Ovarian.  Tubal. This may also be called fallopian tube cancer.  Cancer of the abdominal or pelvic lining (peritoneal cancer).  Prostate.  Pancreatic. Cervical, Uterine, and Ovarian Cancer Your health care provider may recommend that you be screened regularly for cancer of the pelvic organs. These include your ovaries, uterus, and vagina. This screening involves a pelvic exam, which includes checking for microscopic changes to the surface of your cervix (Pap test).  For women ages 21-65, health care providers may recommend a pelvic exam and a Pap test every three years. For women ages 56-65, they may recommend the Pap test and pelvic exam, combined with testing for human papilloma virus (HPV), every five years. Some types of HPV increase your risk of cervical cancer. Testing for HPV may also be done on women of any age who have unclear Pap test results.  Other health care providers may not recommend any screening for nonpregnant women who are considered low risk for pelvic cancer and have no symptoms. Ask your health care provider if a screening pelvic exam is right for you.  If you have had past treatment for cervical cancer or a condition that could lead to cancer, you need Pap tests and screening for cancer for at least 20 years after your treatment. If Pap tests have been discontinued for you, your risk factors (such as  having a new sexual partner) need to be reassessed to determine if you should start having screenings again. Some women have medical problems that increase the chance of getting cervical cancer. In these cases, your health care provider may recommend that you have screening and Pap tests more often.  If you have a family history of uterine cancer or ovarian cancer, talk with your health care provider about genetic screening.  If you have vaginal bleeding after reaching menopause, tell your health care provider.  There are currently no reliable tests available to screen for ovarian cancer. Lung Cancer Lung cancer screening is recommended for adults 90-15 years old who are at high risk for lung cancer because of a history of smoking. A yearly low-dose CT scan of the lungs is recommended if you:  Currently smoke.  Have a history of at least 30 pack-years of smoking and you currently smoke or have quit within the past 15 years. A pack-year is smoking an average of one pack of cigarettes per day for one year. Yearly screening should:  Continue until it has been 15 years since you quit.  Stop if you develop a health problem that would prevent you from having lung cancer treatment. Colorectal Cancer  This type of cancer can be detected and can often be prevented.  Routine colorectal  cancer screening usually begins at age 38 and continues through age 34.  If you have risk factors for colon cancer, your health care provider may recommend that you be screened at an earlier age.  If you have a family history of colorectal cancer, talk with your health care provider about genetic screening.  Your health care provider may also recommend using home test kits to check for hidden blood in your stool.  A small camera at the end of a tube can be used to examine your colon directly (sigmoidoscopy or colonoscopy). This is done to check for the earliest forms of colorectal cancer.  Direct examination of  the colon should be repeated every 5-10 years until age 67. However, if early forms of precancerous polyps or small growths are found or if you have a family history or genetic risk for colorectal cancer, you may need to be screened more often. Skin Cancer  Check your skin from head to toe regularly.  Monitor any moles. Be sure to tell your health care provider:  About any new moles or changes in moles, especially if there is a change in a mole's shape or color.  If you have a mole that is larger than the size of a pencil eraser.  If any of your family members has a history of skin cancer, especially at a young age, talk with your health care provider about genetic screening.  Always use sunscreen. Apply sunscreen liberally and repeatedly throughout the day.  Whenever you are outside, protect yourself by wearing long sleeves, pants, a wide-brimmed hat, and sunglasses. WHAT SHOULD I KNOW ABOUT OSTEOPOROSIS? Osteoporosis is a condition in which bone destruction happens more quickly than new bone creation. After menopause, you may be at an increased risk for osteoporosis. To help prevent osteoporosis or the bone fractures that can happen because of osteoporosis, the following is recommended:  If you are 51-75 years old, get at least 1,000 mg of calcium and at least 600 mg of vitamin D per day.  If you are older than age 43 but younger than age 65, get at least 1,200 mg of calcium and at least 600 mg of vitamin D per day.  If you are older than age 17, get at least 1,200 mg of calcium and at least 800 mg of vitamin D per day. Smoking and excessive alcohol intake increase the risk of osteoporosis. Eat foods that are rich in calcium and vitamin D, and do weight-bearing exercises several times each week as directed by your health care provider. WHAT SHOULD I KNOW ABOUT HOW MENOPAUSE AFFECTS [place]? Depression may occur at any age, but it is more common as you become older. Common  symptoms of depression include:  Low or sad mood.  Changes in sleep patterns.  Changes in appetite or eating patterns.  Feeling an overall lack of motivation or enjoyment of activities that you previously enjoyed.  Frequent crying spells. Talk with your health care provider if you think that you are experiencing depression. WHAT SHOULD I KNOW ABOUT IMMUNIZATIONS? It is important that you get and maintain your immunizations. These include:  Tetanus, diphtheria, and pertussis (Tdap) booster vaccine.  Influenza every year before the flu season begins.  Pneumonia vaccine.  Shingles vaccine. Your health care provider may also recommend other immunizations.   This information is not intended to replace advice given to you by your health care provider. Make sure you discuss any questions you have with your health care provider.  Document Released: 11/29/2005 Document Revised: 10/28/2014 Document Reviewed: 06/09/2014 Elsevier Interactive Patient Education [employer].

## 2016-06-03 NOTE — Progress Notes (Signed)
Pre visit review using our clinic review tool, if applicable. No additional management support is needed unless otherwise documented below in the visit note. 

## 2016-06-03 NOTE — Progress Notes (Addendum)
Subjective:    Patient ID: Susan Greer, female    DOB: [birth year], 75 y.o.   MRN: [id number]  CC: Susan Greer is a 75 y.o. female who presents today for physical exam.    HPI: Patient here for routine physical and to establish care. Feels well and no complaints today.  Hypertension: She checks her blood pressure regularly at home and states  130/70 is average.  Blood pressure 'gets high when comes into office.' Denies exertional chest pain or pressure, numbness or tingling radiating to left arm or jaw, palpitations, dizziness, frequent headaches, changes in vision, or shortness of breath.   No abdominal pain, distention. No vaginal complaints. Jointly agreed a pelvic exam was unnecessary at today's visit.      Colorectal Cancer Screening: UTD  Breast Cancer Screening: Mammogram appears UTD however it is not; patient has politely declined going to mammogram. Has decided not to do them anymore.  Cervical Cancer Screening: Hysterectomy. No abnormal pap smear or GYN cancer.  Bone Health screening/DEXA for 65+: No increased fracture risk. Patient declines screening at this time. Has had DEXA in past and states normal ( unable to find in Epic). No recent fractures.   Immunizations       Tetanus - Unknown. Due. Politely declines.        Pneumococcal - Candidate for.Politely declines. Labs: Screening labs today. Exercise: Gets regular exercise.  Alcohol use: Occasional. Smoking/tobacco use: Nonsmoker.  Wears seat belt: Yes.  HISTORY:  Past Medical History:  Diagnosis Date  . GERD (gastroesophageal reflux disease)   . Hypertension   . Kidney stones   . SVT (supraventricular tachycardia) (HCC)    Dr. Lady Greer at [place], s/p adenosine  . UTI (urinary tract infection)     Past Surgical History:  Procedure Laterality Date  . ABDOMINAL HYSTERECTOMY     Dr. Weston Greer, for fibroid tumor and endometriosis   Family History  Problem Relation Age of Onset  . Hypertension Mother   .  Cancer Mother 28    breast  . Hypertension Father   . Pneumonia Father   . Diabetes Brother   . Hypertension Brother   . Cancer Sister 75    breast      ALLERGIES: Review of patient's allergies indicates no known allergies.  Current Outpatient Prescriptions on File Prior to Visit  Medication Sig Dispense Refill  . amLODipine (NORVASC) 5 MG tablet TAKE 1 TABLET BY MOUTH ONCE DAILY. 90 tablet 3  . clorazepate (TRANXENE) 7.5 MG tablet TAKE 1 TABLET BY MOUTH TWICE DAILY AS NEEDED FOR ANXIETY. 180 tablet 0  . sertraline (ZOLOFT) 50 MG tablet TAKE 1 TABLET BY MOUTH ONCE DAILY 90 tablet 2   No current facility-administered medications on file prior to visit.     Social History  Substance Use Topics  . Smoking status: Never Smoker  . Smokeless tobacco: Never Used  . Alcohol use No    Review of Systems  Constitutional: Negative for chills, fever and unexpected weight change.  HENT: Negative for congestion.   Eyes: Negative for visual disturbance.  Respiratory: Negative for cough.   Cardiovascular: Negative for chest pain, palpitations and leg swelling.  Gastrointestinal: Negative for abdominal distention, abdominal pain, blood in stool, nausea and vomiting.  Genitourinary: Negative for dyspareunia, dysuria, hematuria and vaginal bleeding.  Musculoskeletal: Negative for arthralgias and myalgias.  Skin: Negative for rash.  Neurological: Negative for numbness and headaches.  Hematological: Negative for adenopathy.  Psychiatric/Behavioral: Negative for confusion.  Objective:    BP (!) 176/90   Pulse 78   Temp 98.1 F (36.7 C) (Oral)   Ht 5\' 3"  (1.6 m)   Wt 123 lb (55.8 kg)   SpO2 98%   BMI 21.79 kg/m   BP Readings from Last 3 Encounters:  06/03/16 (!) 176/90  01/11/16 (!) 150/77  06/01/15 (!) 162/90   Wt Readings from Last 3 Encounters:  06/03/16 123 lb (55.8 kg)  01/11/16 128 lb 2 oz (58.1 kg)  06/01/15 129 lb (58.5 kg)    Physical Exam  Constitutional: She  appears well-developed and well-nourished.  Eyes: Conjunctivae are normal.  Neck: No thyroid mass and no thyromegaly present.  Cardiovascular: Normal rate, regular rhythm, normal heart sounds and normal pulses.   Pulmonary/Chest: Effort normal and breath sounds normal. She has no wheezes. She has no rhonchi. She has no rales. Right breast exhibits no inverted nipple, no mass, no nipple discharge, no skin change and no tenderness. Left breast exhibits no inverted nipple, no mass, no nipple discharge, no skin change and no tenderness. Breasts are symmetrical.    Lymphadenopathy:       Head (right side): No submental, no submandibular, no tonsillar, no preauricular, no posterior auricular and no occipital adenopathy present.       Head (left side): No submental, no submandibular, no tonsillar, no preauricular, no posterior auricular and no occipital adenopathy present.  Neurological: She is alert.  Skin: Skin is warm and dry.  Psychiatric: She has a normal mood and affect. Her speech is normal and behavior is normal. Thought content normal.  Vitals reviewed.      Assessment & Plan:   Problem List Items Addressed This Visit      Cardiovascular and Mediastinum   Essential hypertension, benign    Elevated today. No signs or symptoms of hypertensive emergency or urgency. Patient states "this always happens when she is in the office'. Patient stated that she would call or my chart message to me today with her BP reading when she gets home.      Relevant Medications   TOPROL XL 200 MG 24 hr tablet     Other   Depression    Stable. On zoloft. Will continue to follow.       History of shingles    Resolved.        Other Visit Diagnoses    Routine physical examination    -  Primary   Relevant Orders   CBC with Differential/Platelet   Comprehensive metabolic panel   Hemoglobin A1c   Lipid panel   VITAMIN D 25 Hydroxy (Vit-D Deficiency, Fractures)   Essential hypertension        Relevant Medications   TOPROL XL 200 MG 24 hr tablet       I am having Susan Greer maintain her clorazepate, sertraline, amLODipine, and TOPROL XL.   Meds ordered this encounter  Medications  . TOPROL XL 200 MG 24 hr tablet    Sig: Take 1 tablet (200 mg total) by mouth daily. Must keep appt in June for additional refills    Dispense:  30 tablet    Refill:  9    Order Specific Question:   Supervising Provider    Answer:   Sherlene ShamsULLO, TERESA L [[id number]]    Return precautions given.   Risks, benefits, and alternatives of the medications and treatment plan prescribed today were discussed, and patient expressed understanding.   Education regarding symptom management and diagnosis given to patient  on AVS.   Continue to follow with Rennie PlowmanMargaret Arnett, FNP for routine health maintenance.   Susan PuntJudy I Greer and I agreed with plan.   Rennie PlowmanMargaret Arnett, FNP

## 2016-06-03 NOTE — Assessment & Plan Note (Signed)
Stable. On zoloft. Will continue to follow.

## 2016-06-04 ENCOUNTER — Encounter: Payer: Self-pay | Admitting: Family

## 2016-06-04 NOTE — Telephone Encounter (Signed)
Patient was informed of results.  Patient understood and no questions, comments, or concerns at this time.  

## 2016-06-17 ENCOUNTER — Encounter: Payer: Self-pay | Admitting: Family

## 2016-07-11 ENCOUNTER — Telehealth: Payer: Self-pay | Admitting: Family

## 2016-07-11 NOTE — Telephone Encounter (Signed)
Left pt a vm to call office to sch a flu shot. °

## 2016-08-06 ENCOUNTER — Ambulatory Visit (INDEPENDENT_AMBULATORY_CARE_PROVIDER_SITE_OTHER): Payer: Medicare Other

## 2016-08-06 DIAGNOSIS — Z23 Encounter for immunization: Secondary | ICD-10-CM | POA: Diagnosis not present

## 2016-08-22 ENCOUNTER — Encounter: Payer: Self-pay | Admitting: *Deleted

## 2016-11-18 ENCOUNTER — Ambulatory Visit
Admission: RE | Admit: 2016-11-18 | Payer: Medicare Other | Source: Ambulatory Visit | Admitting: Unknown Physician Specialty

## 2016-11-18 ENCOUNTER — Encounter: Admission: RE | Payer: Self-pay | Source: Ambulatory Visit

## 2016-11-18 SURGERY — ESOPHAGOGASTRODUODENOSCOPY (EGD) WITH PROPOFOL
Anesthesia: General

## 2016-12-19 ENCOUNTER — Other Ambulatory Visit: Payer: Self-pay | Admitting: Student

## 2016-12-19 DIAGNOSIS — K7460 Unspecified cirrhosis of liver: Secondary | ICD-10-CM

## 2017-01-01 ENCOUNTER — Ambulatory Visit
Admission: RE | Admit: 2017-01-01 | Discharge: 2017-01-01 | Disposition: A | Discharge status: Home / Self Care | Payer: Medicare Other | Source: Ambulatory Visit | Attending: Student | Admitting: Student

## 2017-01-01 DIAGNOSIS — K7460 Unspecified cirrhosis of liver: Secondary | ICD-10-CM | POA: Diagnosis not present

## 2017-01-01 DIAGNOSIS — K5710 Diverticulosis of small intestine without perforation or abscess without bleeding: Secondary | ICD-10-CM | POA: Diagnosis not present

## 2017-01-01 DIAGNOSIS — R188 Other ascites: Secondary | ICD-10-CM | POA: Insufficient documentation

## 2017-01-01 DIAGNOSIS — R938 Abnormal findings on diagnostic imaging of other specified body structures: Secondary | ICD-10-CM | POA: Insufficient documentation

## 2017-01-01 DIAGNOSIS — N289 Disorder of kidney and ureter, unspecified: Secondary | ICD-10-CM | POA: Diagnosis not present

## 2017-01-01 MED ORDER — GADOXETATE DISODIUM 0.25 MMOL/ML IV SOLN
5.0000 mL | Freq: Once | INTRAVENOUS | Status: AC | PRN
  Administered 2017-01-01: 5 mL via INTRAVENOUS

## 2017-01-24 ENCOUNTER — Encounter: Payer: Self-pay | Admitting: *Deleted

## 2017-01-27 ENCOUNTER — Ambulatory Visit
Admission: RE | Admit: 2017-01-27 | Discharge: 2017-01-27 | Disposition: A | Discharge status: Home / Self Care | Payer: Medicare Other | Source: Ambulatory Visit | Attending: Unknown Physician Specialty | Admitting: Unknown Physician Specialty

## 2017-01-27 ENCOUNTER — Encounter: Payer: Self-pay | Admitting: Certified Registered Nurse Anesthetist

## 2017-01-27 ENCOUNTER — Ambulatory Visit: Payer: Medicare Other | Admitting: Certified Registered Nurse Anesthetist

## 2017-01-27 ENCOUNTER — Encounter
Admission: RE | Disposition: A | Discharge status: Home / Self Care | Payer: Self-pay | Source: Ambulatory Visit | Attending: Unknown Physician Specialty

## 2017-01-27 DIAGNOSIS — F329 Major depressive disorder, single episode, unspecified: Secondary | ICD-10-CM | POA: Diagnosis not present

## 2017-01-27 DIAGNOSIS — Z87442 Personal history of urinary calculi: Secondary | ICD-10-CM | POA: Diagnosis not present

## 2017-01-27 DIAGNOSIS — K7460 Unspecified cirrhosis of liver: Secondary | ICD-10-CM | POA: Diagnosis not present

## 2017-01-27 DIAGNOSIS — K219 Gastro-esophageal reflux disease without esophagitis: Secondary | ICD-10-CM | POA: Insufficient documentation

## 2017-01-27 DIAGNOSIS — K222 Esophageal obstruction: Secondary | ICD-10-CM | POA: Diagnosis not present

## 2017-01-27 DIAGNOSIS — F419 Anxiety disorder, unspecified: Secondary | ICD-10-CM | POA: Insufficient documentation

## 2017-01-27 DIAGNOSIS — K766 Portal hypertension: Secondary | ICD-10-CM | POA: Diagnosis not present

## 2017-01-27 DIAGNOSIS — I10 Essential (primary) hypertension: Secondary | ICD-10-CM | POA: Insufficient documentation

## 2017-01-27 HISTORY — PX: ESOPHAGOGASTRODUODENOSCOPY (EGD) WITH PROPOFOL: SHX5813

## 2017-01-27 SURGERY — ESOPHAGOGASTRODUODENOSCOPY (EGD) WITH PROPOFOL
Anesthesia: General

## 2017-01-27 MED ORDER — LIDOCAINE HCL (CARDIAC) 20 MG/ML IV SOLN
INTRAVENOUS | Status: DC | PRN
  Administered 2017-01-27: 30 mg via INTRAVENOUS

## 2017-01-27 MED ORDER — PROPOFOL 500 MG/50ML IV EMUL
INTRAVENOUS | Status: DC | PRN
  Administered 2017-01-27: 140 ug/kg/min via INTRAVENOUS

## 2017-01-27 MED ORDER — PROPOFOL 500 MG/50ML IV EMUL
INTRAVENOUS | Status: AC
  Filled 2017-01-27: qty 50

## 2017-01-27 MED ORDER — PROPOFOL 10 MG/ML IV BOLUS
INTRAVENOUS | Status: DC | PRN
  Administered 2017-01-27 (×2): 20 mg via INTRAVENOUS
  Administered 2017-01-27: 40 mg via INTRAVENOUS

## 2017-01-27 MED ORDER — LIDOCAINE HCL (PF) 2 % IJ SOLN
INTRAMUSCULAR | Status: AC
  Filled 2017-01-27: qty 2

## 2017-01-27 MED ORDER — SODIUM CHLORIDE 0.9 % IV SOLN
INTRAVENOUS | Status: DC
  Administered 2017-01-27: 10:00:00 via INTRAVENOUS

## 2017-01-27 MED ORDER — MIDAZOLAM HCL 2 MG/2ML IJ SOLN
INTRAMUSCULAR | Status: AC
  Filled 2017-01-27: qty 2

## 2017-01-27 NOTE — Anesthesia Procedure Notes (Signed)
Date/Time: 01/27/2017 9:50 AM Performed by: Rakin Lemelle Pre-anesthesia Checklist: Patient identified, Emergency Drugs available, Suction available, Patient being monitored and Timeout performed Patient Re-evaluated:Patient Re-evaluated prior to inductionOxygen Delivery Method: Nasal cannula       

## 2017-01-27 NOTE — H&P (Signed)
Primary Care Physician:  Rennie Plowman, FNP Primary Gastroenterologist:  Dr. Mechele Collin  Pre-Procedure History & Physical: HPI:  Susan Greer is a 76 y.o. female is here for an endoscopy.   Past Medical History:  Diagnosis Date  . GERD (gastroesophageal reflux disease)   . Hypertension   . Kidney stones   . SVT (supraventricular tachycardia) (HCC)    Dr. Lady Gary at [facility], s/p adenosine  . UTI (urinary tract infection)     Past Surgical History:  Procedure Laterality Date  . ABDOMINAL HYSTERECTOMY     Dr. Weston Anna, for fibroid tumor and endometriosis    Prior to Admission medications   Medication Sig Start Date End Date Taking? Authorizing Provider  amLODipine (NORVASC) 5 MG tablet TAKE 1 TABLET BY MOUTH ONCE DAILY. 05/08/16  Yes Shelia Media, MD  sertraline (ZOLOFT) 50 MG tablet Take 1 tablet (50 mg total) by mouth daily. 06/03/16  Yes Allegra Grana, FNP  TOPROL XL 200 MG 24 hr tablet Take 1 tablet (200 mg total) by mouth daily. Must keep appt in June for additional refills 06/03/16  Yes Allegra Grana, FNP  clorazepate (TRANXENE) 7.5 MG tablet TAKE 1 TABLET BY MOUTH TWICE DAILY AS NEEDED FOR ANXIETY. Patient not taking: Reported on 01/27/2017 08/28/15   Shelia Media, MD    Allergies as of 12/12/2016  . (No Known Allergies)    Family History  Problem Relation Age of Onset  . Hypertension Mother   . Cancer Mother 9    breast  . Hypertension Father   . Pneumonia Father   . Diabetes Brother   . Hypertension Brother   . Cancer Sister 71    breast    Social History   Social History  . Marital status: Widowed    Spouse name: N/A  . Number of children: N/A  . Years of education: N/A   Occupational History  . Not on file.   Social History Main Topics  . Smoking status: Never Smoker  . Smokeless tobacco: Never Used  . Alcohol use No  . Drug use: No  . Sexual activity: Not on file   Other Topics Concern  . Not on file   Social History  Narrative   Lives in [place]. Widow. 2 sons.      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       Diet - regular      Exercise - outside yardwork, lives on 3 acres and helps son with his property     Review of Systems: See HPI, otherwise negative ROS  Physical Exam: BP (!) 158/72   Pulse 73   Temp 97.5 F (36.4 C) (Oral)   Resp 16   Ht  (1.6 m)   Wt 55.8 kg (123 lb)   SpO2 100%   BMI 21.79 kg/m  General:   Alert,  pleasant and cooperative in NAD Head:  Normocephalic and atraumatic. Neck:  Supple; no masses or thyromegaly. Lungs:  Clear throughout to auscultation.    Heart:  Regular rate and rhythm. Abdomen:  Soft, nontender and nondistended. Normal bowel sounds, without guarding, and without rebound.   Neurologic:  Alert and  oriented x4;  grossly normal neurologically.  Impression/Plan: Susan Greer is here for an endoscopy to be performed for screening for esophageal varices due to imaging showing cirrhosis of the liver  Risks, benefits, limitations, and alternatives regarding  endoscopy have been reviewed with the patient.  Questions have been answered.  All parties agreeable.   Lynnae Prude, MD  01/27/2017, 10:43 AM

## 2017-01-27 NOTE — Anesthesia Post-op Follow-up Note (Cosign Needed)
Anesthesia QCDR form completed.        

## 2017-01-27 NOTE — Transfer of Care (Signed)
Immediate Anesthesia Transfer of Care Note  Patient: Susan Greer  Procedure(s) Performed: Procedure(s): ESOPHAGOGASTRODUODENOSCOPY (EGD) WITH PROPOFOL (N/A)  Patient Location: PACU  Anesthesia Type:General  Level of Consciousness: sedated  Airway & Oxygen Therapy: Patient Spontanous Breathing and Patient connected to face mask oxygen  Post-op Assessment: Report given to RN and Post -op Vital signs reviewed and stable  Post vital signs: Reviewed and stable  Last Vitals:  Vitals:   01/27/17 0856  BP: (!) 158/72  Pulse: 73  Resp: 16  Temp: 36.4 C    Last Pain:  Vitals:   01/27/17 0856  TempSrc: Oral         Complications: No apparent anesthesia complications

## 2017-01-27 NOTE — Op Note (Signed)
[facility] Gastroenterology Patient Name: Susan Greer Procedure Date: 01/27/2017 10:46 AM MRN: [id number] Account #: [id number] Date of Birth: October 28, 1940 Admit Type: Outpatient Age: 76 Room: [facility] ENDO ROOM 1 Gender: Female Note Status: Finalized Procedure:            Upper GI endoscopy Indications:          Cirrhosis rule out esophageal varices Providers:            Scot Jun, MD Referring MD:         Lyn Records. Arnett (Referring MD) Medicines:            Propofol per Anesthesia Complications:        No immediate complications. Procedure:            Pre-Anesthesia Assessment:                       - After reviewing the risks and benefits, the patient                        was deemed in satisfactory condition to undergo the                        procedure.                       After obtaining informed consent, the endoscope was                        passed under direct vision. Throughout the procedure,                        the patient's blood pressure, pulse, and oxygen                        saturations were monitored continuously. The Endoscope                        was introduced through the mouth, and advanced to the                        second part of duodenum. The upper GI endoscopy was                        accomplished without difficulty. The patient tolerated                        the procedure well. Findings:      A mild Schatzki ring (acquired) was found at the gastroesophageal       junction. This was at 35cm. No significant varices seen in esophagus.      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      Two, small non-bleeding erosions were found on the posterior wall of the       gastric antrum. There were no stigmata of recent bleeding.      The examined duodenum was normal. Impression:           - Mild Schatzki ring.                       -  Portal hypertensive  gastropathy.                       - Non-bleeding erosive gastropathy.                       - Normal examined duodenum.                       - No specimens collected. Recommendation:       - The findings and recommendations were discussed with                        the patient's family. Scot Jun, MD 01/27/2017 11:04:06 AM This report has been signed electronically. Number of Addenda: 0 Note Initiated On: 01/27/2017 10:46 AM      [facility]

## 2017-01-27 NOTE — Anesthesia Preprocedure Evaluation (Signed)
Anesthesia Evaluation  Patient identified by MRN, date of birth, ID band Patient awake    Reviewed: Allergy & Precautions, NPO status , Patient's Chart, lab work & pertinent test results  History of Anesthesia Complications Negative for: history of anesthetic complications  Airway Mallampati: II  TM Distance: >3 FB Neck ROM: Full    Dental  (+) Poor Dentition   Pulmonary neg pulmonary ROS, neg sleep apnea, neg COPD,    breath sounds clear to auscultation- rhonchi (-) wheezing      Cardiovascular hypertension, Pt. on medications (-) CAD and (-) Past MI + dysrhythmias Supra Ventricular Tachycardia  Rhythm:Regular Rate:Normal - Systolic murmurs and - Diastolic murmurs    Neuro/Psych PSYCHIATRIC DISORDERS Anxiety Depression negative neurological ROS     GI/Hepatic GERD  ,(+) Cirrhosis  (nonalcoholic)      ,   Endo/Other  negative endocrine ROSneg diabetes  Renal/GU Renal disease: hx of nephrolithiasis.     Musculoskeletal negative musculoskeletal ROS (+)   Abdominal (+) - obese,   Peds  Hematology negative hematology ROS (+)   Anesthesia Other Findings Past Medical History: No date: GERD (gastroesophageal reflux disease) No date: Hypertension No date: Kidney stones No date: SVT (supraventricular tachycardia) (HCC)     [place] No date: UTI (urinary tract infection)   Reproductive/Obstetrics                            Anesthesia Physical Anesthesia Plan  ASA: III  Anesthesia Plan: General   Post-op Pain Management:    Induction: Intravenous  Airway Management Planned: Natural Airway  Additional Equipment:   Intra-op Plan:   Post-operative Plan:   Informed Consent: I have reviewed the patients History and Physical, chart, labs and discussed the procedure including the risks, benefits and alternatives for the proposed anesthesia with the  patient or authorized representative who has indicated his/her understanding and acceptance.   Dental advisory given  Plan Discussed with: CRNA and Anesthesiologist  Anesthesia Plan Comments:         Anesthesia Quick Evaluation

## 2017-01-27 NOTE — Anesthesia Postprocedure Evaluation (Signed)
Anesthesia Post Note  Patient: Susan Greer  Procedure(s) Performed: Procedure(s) (LRB): ESOPHAGOGASTRODUODENOSCOPY (EGD) WITH PROPOFOL (N/A)  Patient location during evaluation: Endoscopy Anesthesia Type: General Level of consciousness: awake and alert and oriented Pain management: pain level controlled Vital Signs Assessment: post-procedure vital signs reviewed and stable Respiratory status: spontaneous breathing, nonlabored ventilation and respiratory function stable Cardiovascular status: blood pressure returned to baseline and stable Postop Assessment: no signs of nausea or vomiting Anesthetic complications: no     Last Vitals:  Vitals:   01/27/17 1104 01/27/17 1114  BP: (!) 93/51 (!) 102/59  Pulse: 63 68  Resp: 18 17  Temp: 36.4 C     Last Pain:  Vitals:   01/27/17 1104  TempSrc: Tympanic                 Penne Rosenstock

## 2017-01-28 ENCOUNTER — Encounter: Payer: Self-pay | Admitting: Unknown Physician Specialty

## 2017-03-21 ENCOUNTER — Other Ambulatory Visit: Payer: Self-pay

## 2017-03-21 DIAGNOSIS — I10 Essential (primary) hypertension: Secondary | ICD-10-CM

## 2017-03-21 MED ORDER — SERTRALINE HCL 50 MG PO TABS: 50.0000 mg | ORAL_TABLET | Freq: Every day | ORAL | 0 refills | Status: DC

## 2017-03-21 MED ORDER — TOPROL XL 200 MG PO TB24: 200.0000 mg | ORAL_TABLET | Freq: Every day | ORAL | 2 refills | Status: DC

## 2017-03-27 ENCOUNTER — Other Ambulatory Visit
Admission: RE | Admit: 2017-03-27 | Discharge: 2017-03-27 | Disposition: A | Discharge status: Home / Self Care | Payer: Medicare Other | Source: Ambulatory Visit | Attending: Student | Admitting: Student

## 2017-03-27 DIAGNOSIS — R197 Diarrhea, unspecified: Secondary | ICD-10-CM | POA: Insufficient documentation

## 2017-03-27 LAB — GASTROINTESTINAL PANEL BY PCR, STOOL (REPLACES STOOL CULTURE)

## 2017-03-27 LAB — C DIFFICILE QUICK SCREEN W PCR REFLEX
C DIFFICILE (CDIFF) TOXIN: NEGATIVE
C DIFFICLE (CDIFF) ANTIGEN: NEGATIVE
C Diff interpretation: NOT DETECTED

## 2017-04-30 ENCOUNTER — Ambulatory Visit (INDEPENDENT_AMBULATORY_CARE_PROVIDER_SITE_OTHER): Payer: Medicare Other | Admitting: Family

## 2017-04-30 ENCOUNTER — Encounter: Payer: Self-pay | Admitting: Family

## 2017-04-30 VITALS — BP 135/80 | HR 84 | Temp 98.2°F | Ht 63.0 in | Wt 140.2 lb

## 2017-04-30 DIAGNOSIS — K7460 Unspecified cirrhosis of liver: Secondary | ICD-10-CM

## 2017-04-30 DIAGNOSIS — R188 Other ascites: Principal | ICD-10-CM

## 2017-04-30 DIAGNOSIS — I10 Essential (primary) hypertension: Secondary | ICD-10-CM | POA: Diagnosis not present

## 2017-04-30 LAB — COMPREHENSIVE METABOLIC PANEL
ALK PHOS: 256 U/L — AB (ref 39–117)
ALT: 28 U/L (ref 0–35)
AST: 65 U/L — AB (ref 0–37)
Albumin: 2.8 g/dL — ABNORMAL LOW (ref 3.5–5.2)
BILIRUBIN TOTAL: 2.1 mg/dL — AB (ref 0.2–1.2)
BUN: 9 mg/dL (ref 6–23)
CALCIUM: 8.6 mg/dL (ref 8.4–10.5)
CO2: 26 mEq/L (ref 19–32)
CREATININE: 0.65 mg/dL (ref 0.40–1.20)
Chloride: 107 mEq/L (ref 96–112)
GFR: 94.37 mL/min (ref >60.00)
Glucose, Bld: 99 mg/dL (ref 70–99)
Potassium: 4.1 mEq/L (ref 3.5–5.1)
Sodium: 140 mEq/L (ref 135–145)
TOTAL PROTEIN: 7.6 g/dL (ref 6.0–8.3)

## 2017-04-30 LAB — BRAIN NATRIURETIC PEPTIDE: PRO B NATRI PEPTIDE: 158 pg/mL — AB (ref 0.0–100.0)

## 2017-04-30 LAB — PROTIME-INR
INR: 1.3 ratio — ABNORMAL HIGH (ref 0.8–1.0)
Prothrombin Time: 14.3 s — ABNORMAL HIGH (ref 9.6–13.1)

## 2017-04-30 NOTE — Assessment & Plan Note (Addendum)
Has gained 17 pounds in 3 months.  Concern for fluid retention and in the abdomen, bilateral lower extremities in the context of cirrhosis, perhaps worsening. Suspect Hypoalbuminemia playing a role. [facility] and I place that referral referral given today. Also reached out to her GI team for advice and to see if interim diuretic therapy was appropriate.

## 2017-04-30 NOTE — Progress Notes (Signed)
Pre visit review using our clinic review tool, if applicable. No additional management support is needed unless otherwise documented below in the visit note. 

## 2017-04-30 NOTE — Addendum Note (Signed)
Addended by: Warden FillersWRIGHT, LATOYA S on: 04/30/2017 01:58 PM   Modules accepted: Orders

## 2017-04-30 NOTE — Progress Notes (Signed)
Subjective:    Patient ID: Susan Greer, female    DOB: [birth year], 76 y.o.   MRN: [id number]  CC: Susan Greer is a 76 y.o. female who presents today for an acute visit.    HPI: CC: Leg swelling x 6 weeks, unchanged. Doesn't improve with elevation. Hasn't worn compression stockings.  NO h/o dvt, cancer.  No dysuria, urinary frequency, flank pain, fever.   HTN- at home averages 120/71. Compliant with medication. Denies exertional chest pain or pressure, numbness or tingling radiating to left arm or jaw, palpitations, dizziness, frequent headaches, changes in vision, or shortness of breath.    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    CT MR abdomen 12/2016- cirrhosis, no discrete mass of the liver. Gallbladder sludge. Small amount of ascites  HISTORY:  Past Medical History:  Diagnosis Date  . GERD (gastroesophageal reflux disease)   . Hypertension   . Kidney stones   . SVT (supraventricular tachycardia) (HCC)    [facility] [facility]  . UTI (urinary tract infection)    Past Surgical History:  Procedure Laterality Date  . ABDOMINAL HYSTERECTOMY     Dr. [place], for fibroid tumor and endometriosis  . ESOPHAGOGASTRODUODENOSCOPY (EGD) WITH PROPOFOL N/A 01/27/2017   Procedure: ESOPHAGOGASTRODUODENOSCOPY (EGD) WITH PROPOFOL;  Surgeon: Scot Junobert T Elliott, MD;  [facility];  Service: Endoscopy;  Laterality: N/A;   Family History  Problem Relation Age of Onset  . Hypertension Mother   . Cancer Mother [id number]       breast  . Hypertension Father   . Pneumonia Father   . Diabetes Brother   . Hypertension Brother   . Cancer Sister 30       breast    Allergies: Patient has no known allergies. Current Outpatient Prescriptions on File Prior to Visit  Medication Sig Dispense Refill  . amLODipine (NORVASC) 5 MG tablet TAKE 1 TABLET BY MOUTH ONCE DAILY. 90 tablet 3  . clorazepate (TRANXENE) 7.5 MG tablet TAKE 1 TABLET BY MOUTH  TWICE DAILY AS NEEDED FOR ANXIETY. 180 tablet 0  . sertraline (ZOLOFT) 50 MG tablet Take 1 tablet (50 mg total) by mouth daily. 90 tablet 0  . TOPROL XL 200 MG 24 hr tablet Take 1 tablet (200 mg total) by mouth daily. Must keep appt in June for additional refills 30 tablet 2   No current facility-administered medications on file prior to visit.     Social History  Substance Use Topics  . Smoking status: Never Smoker  . Smokeless tobacco: Never Used  . Alcohol use No    Review of Systems  Constitutional: Negative for chills and fever.  Respiratory: Negative for cough and shortness of breath.   Cardiovascular: Positive for leg swelling. Negative for chest pain and palpitations.  Gastrointestinal: Positive for diarrhea (chronic). Negative for abdominal distention, abdominal pain, constipation, nausea and vomiting.      Objective:    BP 135/80   Pulse 84   Temp 98.2 F (36.8 C) (Oral)   Ht 5\' 3"  (1.6 m)   Wt 140 lb 3.2 oz (63.6 kg)   SpO2 94%   BMI 24.84 kg/m   Wt Readings from Last 3 Encounters:  04/30/17 140 lb 3.2 oz (63.6 kg)  01/27/17 123 lb (55.8 kg)  06/03/16 123 lb (55.8 kg)    Physical Exam  Constitutional: She appears well-developed and well-nourished.  Eyes: Conjunctivae are normal.  Cardiovascular: Normal  rate, regular rhythm, normal heart sounds and normal pulses.   BLE + 2 pitting edema. No palpable cords or masses. No erythema or increased warmth. No asymmetry in calf size when compared bilaterally LE hair growth symmetric and present. No discoloration of varicosities noted. LE warm and palpable pedal pulses.   Pulmonary/Chest: Effort normal and breath sounds normal. She has no wheezes. She has no rhonchi. She has no rales.  Abdominal: Soft. Normal appearance and bowel sounds are normal. She exhibits no distension, no fluid wave, no ascites and no mass. There is no tenderness. There is no rigidity, no rebound, no guarding and no CVA tenderness.  Unable to  appreciate fluid wave or ascites.  Neurological: She is alert.  Skin: Skin is warm and dry.  Psychiatric: She has a normal mood and affect. Her speech is normal and behavior is normal. Thought content normal.  Vitals reviewed.      Assessment & Plan:   Problem List Items Addressed This Visit      Cardiovascular and Mediastinum   Essential hypertension, benign    Improvement in the office after patient rested. Pleased with values at home. We'll continue current regimen at this time. Amlodipine is not new medication for patient, may be contributing to swelling however this time more concerned with cirrhosis.        Digestive   Cirrhosis of liver (HCC) - Primary    Has gained 17 pounds in 3 months.  Concern for fluid retention and in the abdomen, bilateral lower extremities in the context of cirrhosis, perhaps worsening. Suspect Hypoalbuminemia playing a role. Patient was [facility] and I place that referral referral given today. Also reached out to her GI team for advice and to see if interim diuretic therapy was appropriate.        Relevant Orders   Ambulatory referral to Gastroenterology   Comprehensive metabolic panel   Brain natriuretic peptide   INR/PT        I am having Susan Greer maintain her clorazepate, amLODipine, TOPROL XL, and sertraline.   No orders of the defined types were placed in this encounter.   Return precautions given.   Risks, benefits, and alternatives of the medications and treatment plan prescribed today were discussed, and patient expressed understanding.   Education regarding symptom management and diagnosis given to patient on AVS.  Continue to follow with Allegra Grana, FNP for routine health maintenance.   Susan Punt and I agreed with plan.   Rennie Plowman, FNP

## 2017-04-30 NOTE — Assessment & Plan Note (Signed)
Improvement in the office after patient rested. Pleased with values at home. We'll continue current regimen at this time. Amlodipine is not new medication for patient, may be contributing to swelling however this time more concerned with cirrhosis.

## 2017-04-30 NOTE — Patient Instructions (Addendum)
Restrict sodium Obtain from alcohol completely Elevate legs -toes above nose!   [facility] [place] and I have also re-placed your referral to [facility].

## 2017-05-02 ENCOUNTER — Telehealth: Payer: Self-pay

## 2017-05-02 MED ORDER — SPIRONOLACTONE 25 MG PO TABS: 25.0000 mg | ORAL_TABLET | Freq: Every day | ORAL | 0 refills | Status: DC

## 2017-05-02 MED ORDER — FUROSEMIDE 20 MG PO TABS: 20.0000 mg | ORAL_TABLET | Freq: Every day | ORAL | 0 refills | Status: DC

## 2017-05-02 NOTE — Telephone Encounter (Signed)
I don't have enough data to answer this question. Needs to be reviewed by Jason CoopArnett

## 2017-05-02 NOTE — Telephone Encounter (Signed)
Pt called and wanted to know if M. Arnett was going to send in an rx for lasix. Pt states that if she is that she would like it sent to [facility] [place], [place] - [address]

## 2017-05-02 NOTE — Addendum Note (Signed)
Addended by: Acey LavOMAN, Makaylynn Bonillas M on: 05/02/2017 04:18 PM   Modules accepted: Orders

## 2017-05-02 NOTE — Telephone Encounter (Signed)
GI provider Susan Greer called about patient.  She suggested that patient be placed on lasix 20mg  and Spironolactone 25mg . Please advise.

## 2017-05-02 NOTE — Telephone Encounter (Signed)
Please advise 

## 2017-05-02 NOTE — Telephone Encounter (Signed)
Spoke with M. Arnett on phone, received orders for Lasix 20mg  daily for 14 days and spirolactone 25mg  once daily for 14 days, sent to the [facility].  Notified the patient that she has both Rx's at the pharmacy.  Advised that she needs to decrease her slat intake and elevate her legs.  Advised she needs a follow up appt next week for labs with [name] and scheduled for Wednesday.  Advised her to call PA Marcelino DusterMichelle at [facility] on Monday to schedule a follow up appt with them.  Patient agreed and will comply. thanks

## 2017-05-07 ENCOUNTER — Ambulatory Visit (INDEPENDENT_AMBULATORY_CARE_PROVIDER_SITE_OTHER): Payer: Medicare Other | Admitting: Family

## 2017-05-07 ENCOUNTER — Encounter: Payer: Self-pay | Admitting: Family

## 2017-05-07 VITALS — BP 138/76 | HR 80 | Temp 98.3°F | Ht 63.0 in | Wt 132.2 lb

## 2017-05-07 DIAGNOSIS — R188 Other ascites: Principal | ICD-10-CM

## 2017-05-07 DIAGNOSIS — K7460 Unspecified cirrhosis of liver: Secondary | ICD-10-CM | POA: Diagnosis not present

## 2017-05-07 LAB — BASIC METABOLIC PANEL
BUN: 10 mg/dL (ref 6–23)
CALCIUM: 8.9 mg/dL (ref 8.4–10.5)
CO2: 30 mEq/L (ref 19–32)
Chloride: 104 mEq/L (ref 96–112)
Creatinine, Ser: 0.76 mg/dL (ref 0.40–1.20)
GFR: 78.78 mL/min (ref >60.00)
Glucose, Bld: 145 mg/dL — ABNORMAL HIGH (ref 70–99)
POTASSIUM: 4.1 meq/L (ref 3.5–5.1)
SODIUM: 140 meq/L (ref 135–145)

## 2017-05-07 MED ORDER — FUROSEMIDE 20 MG PO TABS
20.0000 mg | ORAL_TABLET | Freq: Every day | ORAL | 0 refills | Status: DC
Start: 2017-05-07 — End: 2017-06-05

## 2017-05-07 MED ORDER — SPIRONOLACTONE 25 MG PO TABS
25.0000 mg | ORAL_TABLET | Freq: Every day | ORAL | 0 refills | Status: DC
Start: 2017-05-07 — End: 2017-06-05

## 2017-05-07 NOTE — Assessment & Plan Note (Signed)
Pleased to see much improvement. Patient has lost 8 pounds. Edema very much improved. Discussed with patient at great length electrolyte abnormalities on spirolactone and Lasix. She has a good understanding of this. I'm pleased also that she is seeing [facility] in 3 weeks. We jointly agreed that she may take a few more [facility] more of the diuretics and then, I have advised her to stop therapy. We will recheck BMP next week to ensure no electrolyte abnormalities. I will allow [facility] to evaluate whether she needs to stay on this regimen

## 2017-05-07 NOTE — Patient Instructions (Addendum)
Okay to take couple of few more days to week of diuretics.   At that point, I would advise you to stop and [facility]   Let's recheck you labs next week - non fasting. You can make this appointment at checkout. Aim for next Wednesday if you can as long as labs normal today.   Pleasure seeing you

## 2017-05-07 NOTE — Progress Notes (Signed)
Pre visit review using our clinic review tool, if applicable. No additional management support is needed unless otherwise documented below in the visit note. 

## 2017-05-07 NOTE — Progress Notes (Signed)
Subjective:    Patient ID: Susan Greer, female    DOB: [birth year], 76 y.o.   MRN: [id number]  CC: Susan Greer is a 76 y.o. female who presents today for follow up.   HPI: LE edema- improved. took lasix 20mg  and spirolactone 25mg .  BP normal. No dizziness, CP, orthopnea.   Elevating legs helping as well  SOB has improved.   [facility] - appt 05/22/17    HISTORY:  Past Medical History:  Diagnosis Date  . GERD (gastroesophageal reflux disease)   . Hypertension   . Kidney stones   . SVT (supraventricular tachycardia) (HCC)    Dr. Lady Gary at [place], s/p adenosine  . UTI (urinary tract infection)    Past Surgical History:  Procedure Laterality Date  . ABDOMINAL HYSTERECTOMY     Dr. Weston Anna, for fibroid tumor and endometriosis  . ESOPHAGOGASTRODUODENOSCOPY (EGD) WITH PROPOFOL N/A 01/27/2017   Procedure: ESOPHAGOGASTRODUODENOSCOPY (EGD) WITH PROPOFOL;  Surgeon: Scot Jun, MD;  Location: [facility] ENDOSCOPY;  Service: Endoscopy;  Laterality: N/A;   Family History  Problem Relation Age of Onset  . Hypertension Mother   . Cancer Mother 40       breast  . Hypertension Father   . Pneumonia Father   . Diabetes Brother   . Hypertension Brother   . Cancer Sister 30       breast    Allergies: Patient has no known allergies. Current Outpatient Prescriptions on File Prior to Visit  Medication Sig Dispense Refill  . amLODipine (NORVASC) 5 MG tablet TAKE 1 TABLET BY MOUTH ONCE DAILY. 90 tablet 3  . clorazepate (TRANXENE) 7.5 MG tablet TAKE 1 TABLET BY MOUTH TWICE DAILY AS NEEDED FOR ANXIETY. 180 tablet 0  . sertraline (ZOLOFT) 50 MG tablet Take 1 tablet (50 mg total) by mouth daily. 90 tablet 0  . TOPROL XL 200 MG 24 hr tablet Take 1 tablet (200 mg total) by mouth daily. Must keep appt in June for additional refills 30 tablet 2   No current facility-administered medications on file prior to visit.     Social History  Substance Use Topics  . Smoking status: Never Smoker    . Smokeless tobacco: Never Used  . Alcohol use No    Review of Systems  Constitutional: Negative for chills and fever.  Respiratory: Negative for cough and shortness of breath.   Cardiovascular: Positive for leg swelling. Negative for chest pain and palpitations.  Gastrointestinal: Negative for nausea and vomiting.      Objective:    BP 138/76   Pulse 80   Temp 98.3 F (36.8 C) (Oral)   Ht 5\' 3"  (1.6 m)   Wt 132 lb 3.2 oz (60 kg)   SpO2 93%   BMI 23.42 kg/m  BP Readings from Last 3 Encounters:  05/07/17 138/76  04/30/17 135/80  01/27/17 137/67   Wt Readings from Last 3 Encounters:  05/07/17 132 lb 3.2 oz (60 kg)  04/30/17 140 lb 3.2 oz (63.6 kg)  01/27/17 123 lb (55.8 kg)    Physical Exam  Constitutional: She appears well-developed and well-nourished.  Eyes: Conjunctivae are normal.  Cardiovascular: Normal rate, regular rhythm, normal heart sounds and normal pulses.    BLE +1 non pitting edema No palpable cords or masses. No erythema or increased warmth. No asymmetry in calf size when compared bilaterally LE hair growth symmetric and present. No discoloration of varicosities noted. LE warm and palpable pedal pulses.   Pulmonary/Chest: Effort normal and  breath sounds normal. She has no wheezes. She has no rhonchi. She has no rales.  Neurological: She is alert.  Skin: Skin is warm and dry.  Psychiatric: She has a normal mood and affect. Her speech is normal and behavior is normal. Thought content normal.  Vitals reviewed.      Assessment & Plan:   Problem List Items Addressed This Visit      Digestive   Cirrhosis of liver (HCC) - Primary    Pleased to see much improvement. Patient has lost 8 pounds. Edema very much improved. Discussed with patient at great length electrolyte abnormalities on spirolactone and Lasix. She has a good understanding of this. I'm pleased also that she is seeing [facility] in 3 weeks. We jointly agreed that she may take a few  more days up to [facility] worth more of the diuretics and then, I have advised her to stop therapy. We will recheck BMP next week to ensure no electrolyte abnormalities. I will allow [facility] to evaluate whether she needs to stay on this regimen       Relevant Medications   furosemide (LASIX) 20 MG tablet   spironolactone (ALDACTONE) 25 MG tablet   Other Relevant Orders   Basic metabolic panel   Basic metabolic panel       I am having Ms. Chiaramonte maintain her clorazepate, amLODipine, TOPROL XL, sertraline, furosemide, and spironolactone.   Meds ordered this encounter  Medications  . furosemide (LASIX) 20 MG tablet    Sig: Take 1 tablet (20 mg total) by mouth daily.    Dispense:  30 tablet    Refill:  0    Order Specific Question:   Supervising Provider    Answer:   Duncan DullULLO, TERESA L [[id number]]  . spironolactone (ALDACTONE) 25 MG tablet    Sig: Take 1 tablet (25 mg total) by mouth daily.    Dispense:  30 tablet    Refill:  0    Order Specific Question:   Supervising Provider    Answer:   Sherlene ShamsULLO, TERESA L [[id number]]    Return precautions given.   Risks, benefits, and alternatives of the medications and treatment plan prescribed today were discussed, and patient expressed understanding.   Education regarding symptom management and diagnosis given to patient on AVS.  Continue to follow with Allegra GranaArnett, Bohdan Macho G, FNP for routine health maintenance.   Susan Greer and I agreed with plan.   Rennie PlowmanMargaret Lia Vigilante, FNP

## 2017-05-08 ENCOUNTER — Ambulatory Visit: Payer: Medicare Other | Admitting: Family

## 2017-05-09 ENCOUNTER — Other Ambulatory Visit: Payer: Self-pay

## 2017-05-09 MED ORDER — AMLODIPINE BESYLATE 5 MG PO TABS: 5.0000 mg | ORAL_TABLET | Freq: Every day | ORAL | 3 refills | Status: DC

## 2017-05-13 ENCOUNTER — Telehealth: Payer: Self-pay | Admitting: Family

## 2017-05-13 NOTE — Telephone Encounter (Signed)
Pt called and was looking to set up an appt for labs. Does pt need to come back for labs? Please advise, thank you!

## 2017-05-14 NOTE — Telephone Encounter (Signed)
There are no lab orders. Did you want atient to return for labs? I do not see where you stated that. Please advise.

## 2017-05-15 NOTE — Telephone Encounter (Signed)
Susan Greer and Susan Greer,  There is future lab order for BMP in chart- placed last week  Please ensure pt comes in today or tomorrow for appt

## 2017-05-15 NOTE — Telephone Encounter (Signed)
Spoken to patient and scheduled lab appointment for tomorrow @1500 

## 2017-05-16 ENCOUNTER — Other Ambulatory Visit: Payer: Medicare Other

## 2017-05-19 ENCOUNTER — Other Ambulatory Visit (INDEPENDENT_AMBULATORY_CARE_PROVIDER_SITE_OTHER): Payer: Medicare Other

## 2017-05-19 DIAGNOSIS — K7460 Unspecified cirrhosis of liver: Secondary | ICD-10-CM | POA: Diagnosis not present

## 2017-05-19 DIAGNOSIS — R188 Other ascites: Principal | ICD-10-CM

## 2017-05-19 LAB — BASIC METABOLIC PANEL
BUN: 11 mg/dL (ref 6–23)
CALCIUM: 8.2 mg/dL — AB (ref 8.4–10.5)
CHLORIDE: 108 meq/L (ref 96–112)
CO2: 26 meq/L (ref 19–32)
Creatinine, Ser: 0.78 mg/dL (ref 0.40–1.20)
GFR: 76.45 mL/min (ref >60.00)
Glucose, Bld: 161 mg/dL — ABNORMAL HIGH (ref 70–99)
POTASSIUM: 4.4 meq/L (ref 3.5–5.1)
Sodium: 139 mEq/L (ref 135–145)

## 2017-06-05 ENCOUNTER — Encounter: Payer: Self-pay | Admitting: Family

## 2017-06-05 ENCOUNTER — Ambulatory Visit (INDEPENDENT_AMBULATORY_CARE_PROVIDER_SITE_OTHER): Payer: Medicare Other | Admitting: Family

## 2017-06-05 VITALS — BP 148/78 | HR 68 | Temp 98.1°F | Ht 63.0 in | Wt 127.4 lb

## 2017-06-05 DIAGNOSIS — I10 Essential (primary) hypertension: Secondary | ICD-10-CM

## 2017-06-05 DIAGNOSIS — F329 Major depressive disorder, single episode, unspecified: Secondary | ICD-10-CM | POA: Diagnosis not present

## 2017-06-05 DIAGNOSIS — R188 Other ascites: Secondary | ICD-10-CM

## 2017-06-05 DIAGNOSIS — Z0001 Encounter for general adult medical examination with abnormal findings: Secondary | ICD-10-CM

## 2017-06-05 DIAGNOSIS — F32A Depression, unspecified: Secondary | ICD-10-CM

## 2017-06-05 DIAGNOSIS — Z0000 Encounter for general adult medical examination without abnormal findings: Secondary | ICD-10-CM

## 2017-06-05 DIAGNOSIS — K7460 Unspecified cirrhosis of liver: Secondary | ICD-10-CM

## 2017-06-05 LAB — COMPREHENSIVE METABOLIC PANEL
ALBUMIN: 2.8 g/dL — AB (ref 3.5–5.2)
ALK PHOS: 248 U/L — AB (ref 39–117)
ALT: 26 U/L (ref 0–35)
AST: 66 U/L — AB (ref 0–37)
BILIRUBIN TOTAL: 2.6 mg/dL — AB (ref 0.2–1.2)
BUN: 10 mg/dL (ref 6–23)
CO2: 28 mEq/L (ref 19–32)
CREATININE: 0.71 mg/dL (ref 0.40–1.20)
Calcium: 8.9 mg/dL (ref 8.4–10.5)
Chloride: 104 mEq/L (ref 96–112)
GFR: 85.2 mL/min (ref >60.00)
Glucose, Bld: 108 mg/dL — ABNORMAL HIGH (ref 70–99)
POTASSIUM: 4.8 meq/L (ref 3.5–5.1)
SODIUM: 138 meq/L (ref 135–145)
TOTAL PROTEIN: 7.6 g/dL (ref 6.0–8.3)

## 2017-06-05 LAB — CBC WITH DIFFERENTIAL/PLATELET
BASOS PCT: 0.6 % (ref 0.0–3.0)
Basophils Absolute: 0.1 10*3/uL (ref 0.0–0.1)
Eosinophils Absolute: 0.2 10*3/uL (ref 0.0–0.7)
Eosinophils Relative: 3.1 % (ref 0.0–5.0)
HCT: 45.7 % (ref 36.0–46.0)
Hemoglobin: 15.3 g/dL — ABNORMAL HIGH (ref 12.0–15.0)
LYMPHS PCT: 18.3 % (ref 12.0–46.0)
Lymphs Abs: 1.5 10*3/uL (ref 0.7–4.0)
MCHC: 33.5 g/dL (ref 30.0–36.0)
MCV: 99.3 fl (ref 78.0–100.0)
MONO ABS: 0.7 10*3/uL (ref 0.1–1.0)
Monocytes Relative: 9.3 % (ref 3.0–12.0)
Neutro Abs: 5.5 10*3/uL (ref 1.4–7.7)
Neutrophils Relative %: 68.7 % (ref 43.0–77.0)
Platelets: 144 10*3/uL — ABNORMAL LOW (ref 150.0–400.0)
RBC: 4.6 Mil/uL (ref 3.87–5.11)
RDW: 15.9 % — ABNORMAL HIGH (ref 11.5–15.5)
WBC: 8 10*3/uL (ref 4.0–10.5)

## 2017-06-05 LAB — LIPID PANEL
Cholesterol: 146 mg/dL (ref 0–200)
HDL: 28.5 mg/dL — ABNORMAL LOW (ref >39.00)
LDL Cholesterol: 98 mg/dL (ref 0–99)
NONHDL: 117.01
Total CHOL/HDL Ratio: 5
Triglycerides: 95 mg/dL (ref 0.0–149.0)
VLDL: 19 mg/dL (ref 0.0–40.0)

## 2017-06-05 LAB — TSH: TSH: 4.48 u[IU]/mL (ref 0.35–4.50)

## 2017-06-05 LAB — VITAMIN D 25 HYDROXY (VIT D DEFICIENCY, FRACTURES): VITD: 49.23 ng/mL (ref 30.00–100.00)

## 2017-06-05 LAB — HEMOGLOBIN A1C: HEMOGLOBIN A1C: 5.2 % (ref 4.6–6.5)

## 2017-06-05 NOTE — Progress Notes (Signed)
Pre visit review using our clinic review tool, if applicable. No additional management support is needed unless otherwise documented below in the visit note. 

## 2017-06-05 NOTE — Assessment & Plan Note (Signed)
Very stable at this time. Plans to stay on medication for now; advised to ensure she titrates off of if she decides to stop in the future

## 2017-06-05 NOTE — Assessment & Plan Note (Signed)
Agrees to do Cologuard, ordered. Declines DEXA, mammogram, clinical breast exam. Declines pelvic exam in the absence of symptoms and since hysterectomy. Due for vaccines ( pneumonia, tdap, shingrex) and she declines at this time; have advised if she changes her mind, she may have these done at local  Pharmacy. Declines referral to dermatology due to h/o squamous cell carcinoma.

## 2017-06-05 NOTE — Assessment & Plan Note (Signed)
No LE edema today. Will follow with [facility].

## 2017-06-05 NOTE — Patient Instructions (Addendum)
Labs today  Monitor BP at home , goal < 130/80, let me know if persistently higher as we need to change your medications  Cologuard ordered  Great seeing you

## 2017-06-05 NOTE — Assessment & Plan Note (Signed)
Elevated today. Home values < 130/80. Advised to keep log and follow up if persistently elevated.

## 2017-06-05 NOTE — Progress Notes (Signed)
Subjective:    Patient ID: Susan Greer, female    DOB: [birth year], 76 y.o.   MRN: [id number]  CC: Susan Greer is a 76 y.o. female who presents today for physical exam.    HPI: Depression- Unsure if she still needs zoloft, started after husband passed away. No thoughts of hurting herself or anyone else  HTN-compliant with medication. 'always high when in doctor's office.' Takes at home 119/70.  Denies exertional chest pain or pressure, numbness or tingling radiating to left arm or jaw, palpitations, dizziness, frequent headaches, changes in vision, or shortness of breath.      Cirrhosis- follows with [facility]. No longer on diuretics at this tim.   Colorectal Cancer Screening: due, declines; would like to do Cologuard Breast Cancer Screening: Mammogram due; declines Cervical Cancer Screening: h/o hysterectomy Bone Health screening/DEXA for 65+: Declines at this time.  Lung Cancer Screening: Doesn't have 30 year pack year history and age > 55 years.       Tetanus - due; declines        Pneumococcal - declines Labs: Screening labs today. Exercise: Gets regular exercise.  Alcohol use: none Smoking/tobacco use: Nonsmoker.  Regular dental exams: UTD Wears seat belt: Yes. Skin: no new lesions; h/o squamous cell  HISTORY:  Past Medical History:  Diagnosis Date  . GERD (gastroesophageal reflux disease)   . Hypertension   . Kidney stones   . SVT (supraventricular tachycardia) (HCC)    Dr. Lady GaryFath at [facility], s/p adenosine  . UTI (urinary tract infection)     Past Surgical History:  Procedure Laterality Date  . ABDOMINAL HYSTERECTOMY     Dr. Weston AnnaEllington, for fibroid tumor and endometriosis  . ESOPHAGOGASTRODUODENOSCOPY (EGD) WITH PROPOFOL N/A 01/27/2017   Procedure: ESOPHAGOGASTRODUODENOSCOPY (EGD) WITH PROPOFOL;  Surgeon: Scot Junobert T Elliott, MD;  [facility];  Service: Endoscopy;  Laterality: N/A;   Family History  Problem Relation Age of Onset  . Hypertension  Mother   . Cancer Mother 6788       breast  . Hypertension Father   . Pneumonia Father   . Diabetes Brother   . Hypertension Brother   . Cancer Sister 4130       breast      ALLERGIES: Patient has no known allergies.  Current Outpatient Prescriptions on File Prior to Visit  Medication Sig Dispense Refill  . amLODipine (NORVASC) 5 MG tablet Take 1 tablet (5 mg total) by mouth daily. 90 tablet 3  . clorazepate (TRANXENE) 7.5 MG tablet TAKE 1 TABLET BY MOUTH TWICE DAILY AS NEEDED FOR ANXIETY. 180 tablet 0  . sertraline (ZOLOFT) 50 MG tablet Take 1 tablet (50 mg total) by mouth daily. 90 tablet 0  . TOPROL XL 200 MG 24 hr tablet Take 1 tablet (200 mg total) by mouth daily. Must keep appt in June for additional refills 30 tablet 2   No current facility-administered medications on file prior to visit.     Social History  Substance Use Topics  . Smoking status: Never Smoker  . Smokeless tobacco: Never Used  . Alcohol use No    Review of Systems  Constitutional: Negative for chills and fever.  Respiratory: Negative for cough.   Cardiovascular: Negative for chest pain and palpitations.  Gastrointestinal: Negative for nausea and vomiting.      Objective:    BP (!) 148/78   Pulse 68   Temp 98.1 F (36.7 C) (Oral)   Ht 5\' 3"  (1.6 m)  Wt 127 lb 6.4 oz (57.8 kg)   SpO2 94%   BMI 22.57 kg/m   BP Readings from Last 3 Encounters:  06/05/17 (!) 148/78  05/07/17 138/76  04/30/17 135/80   Wt Readings from Last 3 Encounters:  06/05/17 127 lb 6.4 oz (57.8 kg)  05/07/17 132 lb 3.2 oz (60 kg)  04/30/17 140 lb 3.2 oz (63.6 kg)    Physical Exam  Constitutional: She appears well-developed and well-nourished.  Eyes: Conjunctivae are normal.  Neck: No thyroid mass and no thyromegaly present.  Cardiovascular: Normal rate, regular rhythm, normal heart sounds and normal pulses.   Pulmonary/Chest: Effort normal and breath sounds normal. She has no wheezes. She has no rhonchi. She has no  rales.  Lymphadenopathy:       Head (right side): No submental, no submandibular, no tonsillar, no preauricular, no posterior auricular and no occipital adenopathy present.       Head (left side): No submental, no submandibular, no tonsillar, no preauricular, no posterior auricular and no occipital adenopathy present.    She has no cervical adenopathy.  Neurological: She is alert.  Skin: Skin is warm and dry.  Psychiatric: She has a normal mood and affect. Her speech is normal and behavior is normal. Thought content normal.  Vitals reviewed.      Assessment & Plan:   Problem List Items Addressed This Visit      Cardiovascular and Mediastinum   Essential hypertension, benign    Elevated today. Home values < 130/80. Advised to keep log and follow up if persistently elevated.         Digestive   Cirrhosis of liver (HCC)    No LE edema today. Will follow with [facility].         Other   Routine physical examination - Primary    Agrees to do Cologuard, ordered. Declines DEXA, mammogram, clinical breast exam. Declines pelvic exam in the absence of symptoms and since hysterectomy. Due for vaccines ( pneumonia, tdap, shingrex) and she declines at this time; have advised if she changes her mind, she may have these done at local  Pharmacy. Declines referral to dermatology due to h/o squamous cell carcinoma.       Relevant Orders   CBC with Differential/Platelet   Comprehensive metabolic panel   Hemoglobin A1c   Lipid panel   TSH   VITAMIN D 25 Hydroxy (Vit-D Deficiency, Fractures)   Depression    Very stable at this time. Plans to stay on medication for now; advised to ensure she titrates off of if she decides to stop in the future          I have discontinued Susan Greer's furosemide and spironolactone. I am also having her maintain her clorazepate, TOPROL XL, sertraline, and amLODipine.   No orders of the defined types were placed in this encounter.   Return precautions  given.   Risks, benefits, and alternatives of the medications and treatment plan prescribed today were discussed, and patient expressed understanding.   Education regarding symptom management and diagnosis given to patient on AVS.   Continue to follow with Allegra Grana, FNP for routine health maintenance.   Susan Punt and I agreed with plan.   Susan Plowman, FNP

## 2017-06-20 ENCOUNTER — Telehealth: Payer: Self-pay | Admitting: Family

## 2017-06-20 DIAGNOSIS — R188 Other ascites: Principal | ICD-10-CM

## 2017-06-20 DIAGNOSIS — K7460 Unspecified cirrhosis of liver: Secondary | ICD-10-CM

## 2017-06-20 NOTE — Telephone Encounter (Signed)
Pt called about needing a new Rx for medications Furosemide 20 mg and Stironolactone 25 mg.   Pharmacy is [facility] [place], [place] - [address]  Call pt @ [phone]. Thank you!

## 2017-06-20 NOTE — Telephone Encounter (Signed)
These two medication are not on her current medication list, please advise for filling them, thanks

## 2017-06-24 ENCOUNTER — Other Ambulatory Visit: Payer: Self-pay

## 2017-06-24 DIAGNOSIS — I10 Essential (primary) hypertension: Secondary | ICD-10-CM

## 2017-06-24 MED ORDER — TOPROL XL 200 MG PO TB24: 200.0000 mg | ORAL_TABLET | Freq: Every day | ORAL | 2 refills | Status: DC

## 2017-06-25 ENCOUNTER — Other Ambulatory Visit: Payer: Self-pay

## 2017-06-25 MED ORDER — SERTRALINE HCL 50 MG PO TABS: 50.0000 mg | ORAL_TABLET | Freq: Every day | ORAL | 1 refills | Status: DC

## 2017-06-25 MED ORDER — FUROSEMIDE 20 MG PO TABS: 20.0000 mg | ORAL_TABLET | Freq: Every day | ORAL | 1 refills | Status: DC

## 2017-06-25 MED ORDER — SPIRONOLACTONE 25 MG PO TABS: 25.0000 mg | ORAL_TABLET | Freq: Every day | ORAL | 1 refills | Status: DC

## 2017-06-25 NOTE — Telephone Encounter (Signed)
Left message for patient to return phone call.  

## 2017-06-25 NOTE — Telephone Encounter (Signed)
Medication has been refilled.

## 2017-06-25 NOTE — Telephone Encounter (Signed)
Call patient Is she having LE edema? Any abdominal ascites or SOB?   I refilled spirolactone and Lasix as patient requested. We discussed his medications at length in the context of her cirrhosis at last OV.   Please remind patient that most importantly she needs to have electrolytes monitored very closely when she takes medication as needed for lower extremity swelling.  If plans to take a couple days of medications, please have her come in 3 days after starting for BMP.  Pended.   Ensure she calls us EVERYTIME she goes through periods of needing them and also to let us know how many days she takes medication for swelling.

## 2017-06-26 NOTE — Telephone Encounter (Signed)
Patient has been informed. Also she stated she will comply with the directions.

## 2017-06-27 NOTE — Telephone Encounter (Signed)
Pt called and had a couple of questions from yesterday. Pt wanted to know why M. Jason Cooprnett was interested in knowing whether she had an swelling. Pt also stated that she will be out of town for about 1 month and will be unable to come and have labs drawn. Pt stated that she will keep a close eye on whether any swelling or other symptoms occur. Advised patient to call us if she starts to have any symptoms.

## 2017-06-27 NOTE — Telephone Encounter (Signed)
FYI

## 2017-06-30 NOTE — Telephone Encounter (Signed)
Please circle back with patient-  I am not sure why she is confused.  We spoke at length when she was in the office that when she may take aldactone, lasix when she has swelling prn. When she does this , she will need to have labs redrawn during that time. If she is leaving for one month and unable to draw labs, she would need to be seen at urgent care etc to repeat labs.   She very much understood this when we discussed in person. Wandering if something was lost in messages.   Please clarify

## 2017-07-01 NOTE — Telephone Encounter (Signed)
Patient has been informed and she repeated everything back without an issue.  She has no question, comments, or concerns at this time.

## 2017-07-08 ENCOUNTER — Encounter: Payer: Self-pay | Admitting: Family

## 2017-07-08 ENCOUNTER — Ambulatory Visit (INDEPENDENT_AMBULATORY_CARE_PROVIDER_SITE_OTHER): Payer: Medicare Other | Admitting: Family

## 2017-07-08 ENCOUNTER — Ambulatory Visit (INDEPENDENT_AMBULATORY_CARE_PROVIDER_SITE_OTHER): Payer: Medicare Other

## 2017-07-08 VITALS — BP 140/72 | HR 81 | Temp 98.1°F | Ht 63.0 in | Wt 135.2 lb

## 2017-07-08 DIAGNOSIS — R197 Diarrhea, unspecified: Secondary | ICD-10-CM | POA: Diagnosis not present

## 2017-07-08 DIAGNOSIS — K7460 Unspecified cirrhosis of liver: Secondary | ICD-10-CM

## 2017-07-08 DIAGNOSIS — R188 Other ascites: Secondary | ICD-10-CM

## 2017-07-08 MED ORDER — HYDROCORTISONE 2.5 % RE CREA: 1.0000 "application " | TOPICAL_CREAM | Freq: Two times a day (BID) | RECTAL | 0 refills | Status: DC

## 2017-07-08 MED ORDER — HYDROCORTISONE ACETATE 25 MG RE SUPP: 25.0000 mg | Freq: Two times a day (BID) | RECTAL | 0 refills | Status: DC

## 2017-07-08 NOTE — Patient Instructions (Addendum)
Stool culture  And stool cards  Appt with [facility]  Colonoscopy  Trial staying lasix and spirolactone daily to manage swelling in legs.   Please ensure you see [place] regularly, labs at least MONTHLY to ensure electrolytes are normal  Labs one week ( Tuesday) and repeat monthly after that. You may call the office to schedule each month.   If there is no improvement in your symptoms, or if there is any worsening of symptoms, or if you have any additional concerns, please return for re-evaluation; or, if we are closed, consider going to the Emergency Room for evaluation if symptoms urgent.

## 2017-07-08 NOTE — Progress Notes (Signed)
Subjective:    Patient ID: Susan Greer, female    DOB: 1941/05/12, 76 y.o.   MRN: [id number]  CC: Susan Greer is a 76 y.o. female who presents today for follow up.   HPI: CC: diarrhea for 5 months, worsening. Watery brown, after eating, 6 times per day. Doesn't awaken at night.   No fever, abdominal pain. Endorses chills intermittent for past couple of months, usually afternoons or evening. NO recent abx.  Onset correlates with EGD 01/2017. No dietary changes.  Tried probiotics with no change. 'now has terrible cause of hemoorhoids.' Anal pain with BM. BRB noted on toilet paper. Not lightheaded, dizziness. No blood in stool or coffee ground stools.   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 Had been taken lasix and spirolactone for LE swelling 2 days last week with temporarily improvement. Denies orthopnea, exertional chest pain or pressure, numbness or tingling radiating to left arm or jaw, palpitations, dizziness, frequent headaches, changes in vision.     hasn;t done colonoscopy; has done cologaurd.  01/2017 EGD Mild Schatzki ring. - Portal hypertensive gastropathy. - Non-bleeding erosive gastropathy. - Normal examined duodenum. - No specimens collected.  Had followed with michelle johnson for diarrhea 03/2017. Negative stool cultures.   HISTORY:  Past Medical History:  Diagnosis Date  . GERD (gastroesophageal reflux disease)   . Hypertension   . Kidney stones   . SVT (supraventricular tachycardia) (HCC)    Dr. Lady Gary at [place], s/p adenosine  . UTI (urinary tract infection)    Past Surgical History:  Procedure Laterality Date  . ABDOMINAL HYSTERECTOMY     Dr. Weston Anna, for fibroid tumor and endometriosis  . ESOPHAGOGASTRODUODENOSCOPY (EGD) WITH PROPOFOL N/A 01/27/2017   Procedure: ESOPHAGOGASTRODUODENOSCOPY (EGD) WITH PROPOFOL;  Surgeon: Scot Jun, MD;   Location: [facility] ENDOSCOPY;  Service: Endoscopy;  Laterality: N/A;   Family History  Problem Relation Age of Onset  . Hypertension Mother   . Cancer Mother 20       breast  . Hypertension Father   . Pneumonia Father   . Diabetes Brother   . Hypertension Brother   . Cancer Sister 30       breast    Allergies: Patient has no known allergies. Current Outpatient Prescriptions on File Prior to Visit  Medication Sig Dispense Refill  . amLODipine (NORVASC) 5 MG tablet Take 1 tablet (5 mg total) by mouth daily. 90 tablet 3  . clorazepate (TRANXENE) 7.5 MG tablet TAKE 1 TABLET BY MOUTH TWICE DAILY AS NEEDED FOR ANXIETY. 180 tablet 0  . furosemide (LASIX) 20 MG tablet Take 1 tablet (20 mg total) by mouth daily. 30 tablet 1  . sertraline (ZOLOFT) 50 MG tablet Take 1 tablet (50 mg total) by mouth daily. 90 tablet 1  . spironolactone (ALDACTONE) 25 MG tablet Take 1 tablet (25 mg total) by mouth daily. 30 tablet 1  . TOPROL XL 200 MG 24 hr tablet Take 1 tablet (200 mg total) by mouth daily. 30 tablet 2   No current facility-administered medications on file prior to visit.     Social History  Substance Use Topics  . Smoking status: Never Smoker  . Smokeless tobacco: Never Used  . Alcohol use No    Review of Systems  Constitutional: Negative for chills and fever.  Respiratory: Positive for shortness of breath. Negative for cough and wheezing.  Cardiovascular: Positive for leg swelling. Negative for chest pain and palpitations.  Gastrointestinal: Positive for abdominal distention, anal bleeding, diarrhea and rectal pain. Negative for abdominal pain, blood in stool, constipation, nausea and vomiting.      Objective:    BP 140/72   Pulse 81   Temp 98.1 F (36.7 C) (Oral)   Ht  (1.6 m)   Wt 135 lb 3.2 oz (61.3 kg)   SpO2 93%   BMI 23.95 kg/m  BP Readings from Last 3 Encounters:  07/08/17 140/72  06/05/17 (!) 148/78  05/07/17 138/76   Wt Readings from Last 3 Encounters:    07/08/17 135 lb 3.2 oz (61.3 kg)  06/05/17 127 lb 6.4 oz (57.8 kg)  05/07/17 132 lb 3.2 oz (60 kg)    Physical Exam  Constitutional: She appears well-developed and well-nourished.  Eyes: Conjunctivae are normal.  Cardiovascular: Normal rate, regular rhythm, normal heart sounds and normal pulses.   Pulmonary/Chest: Effort normal and breath sounds normal. She has no wheezes. She has no rhonchi. She has no rales.  Abdominal: Soft. Normal appearance and bowel sounds are normal. She exhibits distension and ascites. She exhibits no fluid wave and no mass. There is no tenderness. There is no rigidity, no rebound, no guarding and no CVA tenderness.  Genitourinary: Rectal exam shows external hemorrhoid.  Genitourinary Comments: Multiple prolapsed external hemorrhoids. Nonbleeding.  Neurological: She is alert.  Skin: Skin is warm and dry.  Psychiatric: She has a normal mood and affect. Her speech is normal and behavior is normal. Thought content normal.  Vitals reviewed.      Assessment & Plan:   Problem List Items Addressed This Visit      Digestive   Cirrhosis of liver (HCC)    Concern ascites, SOB, weight gain. Follow up with [facility] NAFLD clinic not until 11/2017. Will set up earlier appointment with [facility], Marcelino Duster for evaluation. Will start spirolactone, lasix daily with close follow up, labs.       Relevant Orders   DG Chest 2 View   Comprehensive metabolic panel     Other   Diarrhea - Primary    Etiology unclear at this time. Afebrile. Well appearing.  Pending stool cultures. Pending occult stool cards. Hemorrhoids of exacerbated by this. Medication given for hemorrhoid management and advised her to follow up with GI as may need to be lanced, surgically removed.      Relevant Medications   hydrocortisone (ANUSOL-HC) 2.5 % rectal cream   hydrocortisone (ANUSOL-HC) 25 MG suppository   Other Relevant Orders   C difficile quick screen w PCR reflex   Gastrointestinal Pathogen Panel  PCR   Ambulatory referral to Gastroenterology   Fecal occult blood, imunochemical       I am having Ms. Denker start on hydrocortisone and hydrocortisone. I am also having her maintain her clorazepate, amLODipine, TOPROL XL, sertraline, furosemide, and spironolactone.   Meds ordered this encounter  Medications  . hydrocortisone (ANUSOL-HC) 2.5 % rectal cream    Sig: Place 1 application rectally 2 (two) times daily.    Dispense:  30 g    Refill:  0    Order Specific Question:   Supervising Provider    Answer:   Duncan Dull L [[id number]]  . hydrocortisone (ANUSOL-HC) 25 MG suppository    Sig: Place 1 suppository (25 mg total) rectally 2 (two) times daily.    Dispense:  12 suppository    Refill:  0    Order Specific Question:  Supervising Provider    Answer:   Sherlene Shams [[id number]]    Return precautions given.   Risks, benefits, and alternatives of the medications and treatment plan prescribed today were discussed, and patient expressed understanding.   Education regarding symptom management and diagnosis given to patient on AVS.  Continue to follow with Allegra Grana, FNP for routine health maintenance.   Susan Greer and I agreed with plan.   Rennie Plowman, FNP

## 2017-07-08 NOTE — Assessment & Plan Note (Signed)
Concern ascites, SOB, weight gain. Follow up with [facility] NAFLD clinic not until 11/2017. Will set up earlier appointment with [facility], Marcelino Duster for evaluation. Will start spirolactone, lasix daily with close follow up, labs.

## 2017-07-08 NOTE — Assessment & Plan Note (Signed)
Etiology unclear at this time. Afebrile. Well appearing.  Pending stool cultures. Pending occult stool cards. Hemorrhoids of exacerbated by this. Medication given for hemorrhoid management and advised her to follow up with GI as may need to be lanced, surgically removed.

## 2017-07-09 NOTE — Telephone Encounter (Signed)
Called pt regarding referral back to [facility]. Let her know that they request that she calls her [facility] doctor to let them know the issues that she is having. She said that she is scheduled to see Dr. Waynard Reeds in February. I told her that she needs to call them to let them know her symptoms so they can get her in before that and if they can not to let me know and I will see if I can work something out for her. She is also requesting results from her xray on 9/18. Pt cb [phone]

## 2017-07-09 NOTE — Addendum Note (Signed)
Addended by: Warden Fillers on: 07/09/2017 10:39 AM   Modules accepted: Orders

## 2017-07-10 ENCOUNTER — Other Ambulatory Visit: Payer: Medicare Other

## 2017-07-10 DIAGNOSIS — R197 Diarrhea, unspecified: Secondary | ICD-10-CM

## 2017-07-11 ENCOUNTER — Telehealth: Payer: Self-pay

## 2017-07-11 LAB — CLOSTRIDIUM DIFFICILE BY PCR: Toxigenic C. Difficile by PCR: DETECTED — AB

## 2017-07-11 MED ORDER — VANCOMYCIN HCL 125 MG PO CAPS: 125.0000 mg | ORAL_CAPSULE | Freq: Four times a day (QID) | ORAL | 0 refills | Status: DC

## 2017-07-11 NOTE — Progress Notes (Signed)
Left message, awaiting reply back.

## 2017-07-11 NOTE — Telephone Encounter (Signed)
Medication has been sent. Left message for patient to return call.

## 2017-07-11 NOTE — Telephone Encounter (Signed)
-----   Message from Allegra Grana, FNP sent at 07/11/2017 10:18 AM EDT ----- Please call in vancomycin for pt as culture shows cdiff  Vancomycin 125 mg po q6hours x 10 days  Please ensure probiotics and that she has follow up appt in a 2-3 to ensure cleared.  Thanks !

## 2017-07-14 LAB — GASTROINTESTINAL PATHOGEN PANEL PCR
C. difficile Tox A/B, PCR: NOT DETECTED
Campylobacter, PCR: NOT DETECTED
Cryptosporidium, PCR: NOT DETECTED
E COLI (ETEC) LT/ST, PCR: NOT DETECTED
E COLI (STEC) STX1/STX2, PCR: NOT DETECTED
E COLI 0157, PCR: NOT DETECTED
GIARDIA LAMBLIA, PCR: NOT DETECTED
NOROVIRUS, PCR: NOT DETECTED
ROTAVIRUS, PCR: NOT DETECTED
SALMONELLA, PCR: NOT DETECTED
SHIGELLA, PCR: NOT DETECTED

## 2017-07-15 ENCOUNTER — Telehealth: Payer: Self-pay | Admitting: Family

## 2017-07-15 ENCOUNTER — Other Ambulatory Visit (INDEPENDENT_AMBULATORY_CARE_PROVIDER_SITE_OTHER): Payer: Medicare Other

## 2017-07-15 DIAGNOSIS — R188 Other ascites: Principal | ICD-10-CM

## 2017-07-15 DIAGNOSIS — R197 Diarrhea, unspecified: Secondary | ICD-10-CM | POA: Diagnosis not present

## 2017-07-15 DIAGNOSIS — K7460 Unspecified cirrhosis of liver: Secondary | ICD-10-CM

## 2017-07-15 DIAGNOSIS — J90 Pleural effusion, not elsewhere classified: Secondary | ICD-10-CM

## 2017-07-15 LAB — COMPREHENSIVE METABOLIC PANEL
ALBUMIN: 2.6 g/dL — AB (ref 3.5–5.2)
ALT: 21 U/L (ref 0–35)
AST: 51 U/L — AB (ref 0–37)
Alkaline Phosphatase: 241 U/L — ABNORMAL HIGH (ref 39–117)
BUN: 12 mg/dL (ref 6–23)
CHLORIDE: 102 meq/L (ref 96–112)
CO2: 31 mEq/L (ref 19–32)
CREATININE: 0.78 mg/dL (ref 0.40–1.20)
Calcium: 8.7 mg/dL (ref 8.4–10.5)
GFR: 76.42 mL/min (ref >60.00)
GLUCOSE: 111 mg/dL — AB (ref 70–99)
Potassium: 3.6 mEq/L (ref 3.5–5.1)
SODIUM: 140 meq/L (ref 135–145)
Total Bilirubin: 1.9 mg/dL — ABNORMAL HIGH (ref 0.2–1.2)
Total Protein: 7.8 g/dL (ref 6.0–8.3)

## 2017-07-15 LAB — FECAL OCCULT BLOOD, IMMUNOCHEMICAL: FECAL OCCULT BLD: NEGATIVE

## 2017-07-15 NOTE — Telephone Encounter (Signed)
Patient has been informed. The swelling is going down. shee stated she will comply with directions and has f/u in two weeks.

## 2017-07-15 NOTE — Telephone Encounter (Signed)
Call pt  We need to circle back with her on how her diarrhea since Cdiff and vancomycin. Improving  CXR shows bilateral effusions which I suspect is from cirrhosis. I want her to have a repeat CXR in 6 weeks and ensure has resolved on diuretics and we don't need to pursue CT chest.    Has her ascites improved on lasix, spirolactone?? Ill look for her lab today.   I know she Seeing dr Servando Snare [facility] 10/30 which is great however between now and then, please advise that maintain close follow up so we can ensure her weight, ascites goes down. Worried about her fluid retention    Case reviewed with supervising, Dr Duncan Dull, and she and I jointly agreed on management plan.

## 2017-07-18 NOTE — Progress Notes (Signed)
She has been placed on list to be seen earlier if they have a cancleation

## 2017-07-22 ENCOUNTER — Telehealth: Payer: Self-pay | Admitting: Family

## 2017-07-22 NOTE — Telephone Encounter (Signed)
Pt lvm requesting to speak with M. Arnett or CMA. Pt cb [phone]

## 2017-07-23 ENCOUNTER — Emergency Department: Payer: Medicare Other

## 2017-07-23 ENCOUNTER — Other Ambulatory Visit: Payer: Self-pay

## 2017-07-23 ENCOUNTER — Encounter: Payer: Self-pay | Admitting: Medical Oncology

## 2017-07-23 ENCOUNTER — Emergency Department
Admission: EM | Admit: 2017-07-23 | Discharge: 2017-07-23 | Disposition: A | Discharge status: Home / Self Care | Payer: Medicare Other | Attending: Emergency Medicine | Admitting: Emergency Medicine

## 2017-07-23 DIAGNOSIS — R05 Cough: Secondary | ICD-10-CM | POA: Diagnosis not present

## 2017-07-23 DIAGNOSIS — I10 Essential (primary) hypertension: Secondary | ICD-10-CM | POA: Insufficient documentation

## 2017-07-23 DIAGNOSIS — R609 Edema, unspecified: Secondary | ICD-10-CM

## 2017-07-23 DIAGNOSIS — L03116 Cellulitis of left lower limb: Secondary | ICD-10-CM | POA: Diagnosis not present

## 2017-07-23 DIAGNOSIS — E876 Hypokalemia: Secondary | ICD-10-CM | POA: Insufficient documentation

## 2017-07-23 DIAGNOSIS — Z79899 Other long term (current) drug therapy: Secondary | ICD-10-CM | POA: Diagnosis not present

## 2017-07-23 DIAGNOSIS — R059 Cough, unspecified: Secondary | ICD-10-CM

## 2017-07-23 DIAGNOSIS — R109 Unspecified abdominal pain: Secondary | ICD-10-CM | POA: Diagnosis present

## 2017-07-23 LAB — COMPREHENSIVE METABOLIC PANEL
ALBUMIN: 2.4 g/dL — AB (ref 3.5–5.0)
ALK PHOS: 181 U/L — AB (ref 38–126)
ALT: 24 U/L (ref 14–54)
AST: 59 U/L — ABNORMAL HIGH (ref 15–41)
Anion gap: 10 (ref 5–15)
BILIRUBIN TOTAL: 3 mg/dL — AB (ref 0.3–1.2)
BUN: 19 mg/dL (ref 6–20)
CALCIUM: 8.2 mg/dL — AB (ref 8.9–10.3)
CO2: 25 mmol/L (ref 22–32)
Chloride: 102 mmol/L (ref 101–111)
Creatinine, Ser: 1.07 mg/dL — ABNORMAL HIGH (ref 0.44–1.00)
GFR calc non Af Amer: 49 mL/min — ABNORMAL LOW (ref >60)
GFR, EST AFRICAN AMERICAN: 57 mL/min — AB (ref >60)
GLUCOSE: 113 mg/dL — AB (ref 65–99)
Potassium: 3.1 mmol/L — ABNORMAL LOW (ref 3.5–5.1)
SODIUM: 137 mmol/L (ref 135–145)
TOTAL PROTEIN: 7.7 g/dL (ref 6.5–8.1)

## 2017-07-23 LAB — CBC
HEMATOCRIT: 42.2 % (ref 35.0–47.0)
HEMOGLOBIN: 14.5 g/dL (ref 12.0–16.0)
MCH: 32.5 pg (ref 26.0–34.0)
MCHC: 34.3 g/dL (ref 32.0–36.0)
MCV: 94.7 fL (ref 80.0–100.0)
Platelets: 140 10*3/uL — ABNORMAL LOW (ref 150–440)
RBC: 4.45 MIL/uL (ref 3.80–5.20)
RDW: 16.2 % — ABNORMAL HIGH (ref 11.5–14.5)
WBC: 15 10*3/uL — ABNORMAL HIGH (ref 3.6–11.0)

## 2017-07-23 LAB — URINALYSIS, COMPLETE (UACMP) WITH MICROSCOPIC
BILIRUBIN URINE: NEGATIVE
GLUCOSE, UA: NEGATIVE mg/dL
Hgb urine dipstick: NEGATIVE
KETONES UR: 5 mg/dL — AB
Leukocytes, UA: NEGATIVE
NITRITE: NEGATIVE
PH: 5 (ref 5.0–8.0)
Protein, ur: 30 mg/dL — AB
Specific Gravity, Urine: 1.02 (ref 1.005–1.030)

## 2017-07-23 LAB — TROPONIN I: Troponin I: <0.03 ng/mL (ref <0.03)

## 2017-07-23 LAB — LIPASE, BLOOD: Lipase: 37 U/L (ref 11–51)

## 2017-07-23 MED ORDER — FUROSEMIDE 40 MG PO TABS: 40.0000 mg | ORAL_TABLET | Freq: Every day | ORAL | 0 refills | Status: DC

## 2017-07-23 MED ORDER — POTASSIUM CHLORIDE ER 10 MEQ PO TBCR: 10.0000 meq | EXTENDED_RELEASE_TABLET | Freq: Every day | ORAL | 6 refills | Status: DC

## 2017-07-23 MED ORDER — DOXYCYCLINE HYCLATE 100 MG PO CAPS: 100.0000 mg | ORAL_CAPSULE | Freq: Two times a day (BID) | ORAL | 0 refills | Status: DC

## 2017-07-23 MED ORDER — DOXYCYCLINE HYCLATE 100 MG PO TABS
100.0000 mg | ORAL_TABLET | Freq: Once | ORAL | Status: AC
  Administered 2017-07-23: 100 mg via ORAL
  Filled 2017-07-23: qty 1

## 2017-07-23 MED ORDER — POTASSIUM CHLORIDE CRYS ER 20 MEQ PO TBCR
40.0000 meq | EXTENDED_RELEASE_TABLET | Freq: Once | ORAL | Status: AC
  Administered 2017-07-23: 40 meq via ORAL
  Filled 2017-07-23: qty 2

## 2017-07-23 MED ORDER — FUROSEMIDE 40 MG PO TABS
40.0000 mg | ORAL_TABLET | Freq: Once | ORAL | Status: AC
  Administered 2017-07-23: 40 mg via ORAL
  Filled 2017-07-23: qty 1

## 2017-07-23 NOTE — ED Notes (Signed)
Pt taken to car via wheelchair by this RN. RN helped pt into car. PT in NAD at this time.

## 2017-07-23 NOTE — ED Triage Notes (Signed)
Pt ambulatory to desk with reports of lower abd pain, pt describes pain as a "bloated" feeling. Pt reports pain x 1 month, PCP placed pt on abx to treat cdiff but test was negative.

## 2017-07-23 NOTE — ED Notes (Signed)
Pt taken to US at this time

## 2017-07-23 NOTE — ED Provider Notes (Signed)
[facility] Emergency Department Provider Note  ____________________________________________   First MD Initiated Contact with Patient 07/23/17 [phone]     (approximate)  I have reviewed the triage vital signs and the nursing notes.   HISTORY  Chief Complaint Abdominal Pain   HPI Susan Greer is a 76 y.o. female with a history of hypertension, GERD and cirrhosis who is presenting with 1 week of worsening abdominal distention as well as swelling to her bilateral lower extremities. She also reports increasing redness to her left lower extremity with weeping. She says that she takes 20 mg daily of Lasix as well as 25 mg daily of spironolactone. She says that she has been off and on cough as well as congestion. Denies any chest pain. Denies any abdominal pain. Said that she had an episode of chills yesterday as well. Unclear of why she has cirrhosis. She says that she is having a workup done now in [place] for this. Denies a history of drinking. Denies having history of hepatitis.   Past Medical History:  Diagnosis Date  . GERD (gastroesophageal reflux disease)   . Hypertension   . Kidney stones   . SVT (supraventricular tachycardia) (HCC)    Dr. Lady Gary at [place], s/p adenosine  . UTI (urinary tract infection)     Patient Active Problem List   Diagnosis Date Noted  . Diarrhea 07/08/2017  . History of shingles 06/03/2016  . Cirrhosis of liver (HCC) 06/03/2016  . Elevated LFTs 01/11/2016  . Macular degeneration, left eye 01/11/2016  . Vaginal prolapse 01/11/2016  . Routine physical examination 02/23/2014  . Depression 02/23/2014  . Anxiety state 02/23/2014  . Essential hypertension, benign 04/12/2013  . Female bladder prolapse 04/12/2013    Past Surgical History:  Procedure Laterality Date  . ABDOMINAL HYSTERECTOMY     Dr. Weston Anna, for fibroid tumor and endometriosis  . ESOPHAGOGASTRODUODENOSCOPY (EGD) WITH PROPOFOL N/A 01/27/2017   Procedure:  ESOPHAGOGASTRODUODENOSCOPY (EGD) WITH PROPOFOL;  Surgeon: Scot Jun, MD;  Location: [facility] ENDOSCOPY;  Service: Endoscopy;  Laterality: N/A;    Prior to Admission medications   Medication Sig Start Date End Date Taking? Authorizing Provider  amLODipine (NORVASC) 5 MG tablet Take 1 tablet (5 mg total) by mouth daily. 05/09/17   Allegra Grana, FNP  clorazepate (TRANXENE) 7.5 MG tablet TAKE 1 TABLET BY MOUTH TWICE DAILY AS NEEDED FOR ANXIETY. 08/28/15   Shelia Media, MD  furosemide (LASIX) 20 MG tablet Take 1 tablet (20 mg total) by mouth daily. 06/25/17   Allegra Grana, FNP  hydrocortisone (ANUSOL-HC) 2.5 % rectal cream Place 1 application rectally 2 (two) times daily. 07/08/17   Allegra Grana, FNP  hydrocortisone (ANUSOL-HC) 25 MG suppository Place 1 suppository (25 mg total) rectally 2 (two) times daily. 07/08/17   Allegra Grana, FNP  sertraline (ZOLOFT) 50 MG tablet Take 1 tablet (50 mg total) by mouth daily. 06/25/17   Allegra Grana, FNP  spironolactone (ALDACTONE) 25 MG tablet Take 1 tablet (25 mg total) by mouth daily. 06/25/17   Allegra Grana, FNP  TOPROL XL 200 MG 24 hr tablet Take 1 tablet (200 mg total) by mouth daily. 06/24/17   Allegra Grana, FNP  vancomycin (VANCOCIN) 125 MG capsule Take 1 capsule (125 mg total) by mouth 4 (four) times daily. 07/11/17   Allegra Grana, FNP    Allergies Patient has no known allergies.  Family History  Problem Relation Age of Onset  . Hypertension Mother   .  Cancer Mother 38       breast  . Hypertension Father   . Pneumonia Father   . Diabetes Brother   . Hypertension Brother   . Cancer Sister 90       breast    Social History Social History  Substance Use Topics  . Smoking status: Never Smoker  . Smokeless tobacco: Never Used  . Alcohol use No    Review of Systems  Constitutional: No fever/chills Eyes: No visual changes. ENT: No sore throat. Cardiovascular: Denies chest pain. Respiratory:  Denies shortness of breath. Gastrointestinal: No abdominal pain.  No nausea, no vomiting.  No diarrhea.  No constipation. Genitourinary: Negative for dysuria. Musculoskeletal: Negative for back pain. Skin: Negative for rash. Neurological: Negative for headaches, focal weakness or numbness.   ____________________________________________   PHYSICAL EXAM:  VITAL SIGNS: ED Triage Vitals  Enc Vitals Group     BP 07/23/17 0913 (!) 143/76     Pulse Rate 07/23/17 0913 94     Resp 07/23/17 0913 18     Temp 07/23/17 0913 98.4 F (36.9 C)     Temp Source 07/23/17 0913 Oral     SpO2 07/23/17 0913 95 %     Weight 07/23/17 0909 135 lb (61.2 kg)     Height 07/23/17 0909  (1.6 m)     Head Circumference --      Peak Flow --      Pain Score 07/23/17 0908 10     Pain Loc --      Pain Edu? --      Excl. in GC? --     Constitutional: Alert and oriented. Well appearing and in no acute distress. Eyes: Conjunctivae are normal.  Head: Atraumatic. Nose: No congestion/rhinnorhea. Mouth/Throat: Mucous membranes are moist.  Neck: No stridor.   Cardiovascular: Normal rate, regular rhythm. Grossly normal heart sounds.   Respiratory: Normal respiratory effort.  No retractions. Lungs CTAB. Gastrointestinal: Soft and nontender. No distention. No CVA tenderness. Musculoskeletal: moderate bilateral lower extremity edema with the left lower extremity being slightly more edematous than the right and with erythema to the left lower extremity from the ankle to just distal to the knee. The erythema is circumferential. There is no induration. There is warmth. No pus but there are several areas of weeping serous fluid to the left lower extremity. Neurologic:  Normal speech and language. No gross focal neurologic deficits are appreciated. Skin:  Skin is warm, dry and intact. No rash noted. Psychiatric: Mood and affect are normal. Speech and behavior are normal.  ____________________________________________    LABS (all labs ordered are listed, but only abnormal results are displayed)  Labs Reviewed  LIPASE, BLOOD  COMPREHENSIVE METABOLIC PANEL  CBC  URINALYSIS, COMPLETE (UACMP) WITH MICROSCOPIC  TROPONIN I   ____________________________________________  EKG  ED ECG REPORT I, Schaevitz,  Teena Irani, the attending physician, personally viewed and interpreted this ECG.   Date: 07/23/2017  EKG Time: 0933  Rate: 89  Rhythm: normal sinus rhythm  Axis: normal  Intervals:prolonged QTC at 526  ST&T Change: no ST segment elevation or depression. No abnormal T-wave inversion.  ____________________________________________  RADIOLOGY  no acute finding on the chest x-ray. Stable. No signs of DVT on the lower extremity ultrasound. ____________________________________________   PROCEDURES  Procedure(s) performed:   Procedures  Critical Care performed:   ____________________________________________   INITIAL IMPRESSION / ASSESSMENT AND PLAN / ED COURSE  Pertinent labs & imaging results that were available during my care of  the patient were reviewed by me and considered in my medical decision making (see chart for details).  ----------------------------------------- 12:20 PM on 07/23/2017 -----------------------------------------  Patient without any distress at this time. Likely cellulitis and peripheral edema related to her Elita Boone. Patient reported that she had a cut several days on the anterior shin of the left lower extremity. This is likely the nidus for the infection. Patient will be discharged with doxycycline. We will also give the patient is a 40 mg of Lasix here in the emergency department and 40 mg of Lasix over the next 3 days, daily. She will hold her 20 mg of Lasix and resume it once a 40 mg I have run out. I will also discharge her with her daily potassium for supplementation. Her potassium was 3.1 today. Patient has appointment with her primary care doctor this coming  Monday. She is understanding of the plan and willing to comply. Also discussed need for return for any worsening or concerning symptoms especially fever or worsening of the redness to the left lower extremity. The patient as well as family understanding and will comply. Will be discharged home.      ____________________________________________   FINAL CLINICAL IMPRESSION(S) / ED DIAGNOSES  Final diagnoses:  Cough  peripheral edema. Hypokalemia. Cellulitis.    NEW MEDICATIONS STARTED DURING THIS VISIT:  New Prescriptions   No medications on file     Note:  This document was prepared using Dragon voice recognition software and may include unintentional dictation errors.     Myrna Blazer, MD 07/23/17 [phone]

## 2017-07-24 NOTE — Telephone Encounter (Signed)
Left message for patient to return call back.  

## 2017-07-28 ENCOUNTER — Ambulatory Visit: Payer: Medicare Other | Admitting: Family

## 2017-07-29 ENCOUNTER — Ambulatory Visit (INDEPENDENT_AMBULATORY_CARE_PROVIDER_SITE_OTHER): Payer: Medicare Other | Admitting: Family

## 2017-07-29 ENCOUNTER — Encounter: Payer: Self-pay | Admitting: Family

## 2017-07-29 VITALS — BP 128/68 | HR 79 | Temp 97.6°F | Ht 63.0 in | Wt 135.2 lb

## 2017-07-29 DIAGNOSIS — K7460 Unspecified cirrhosis of liver: Secondary | ICD-10-CM | POA: Diagnosis not present

## 2017-07-29 DIAGNOSIS — L0390 Cellulitis, unspecified: Secondary | ICD-10-CM | POA: Insufficient documentation

## 2017-07-29 DIAGNOSIS — R197 Diarrhea, unspecified: Secondary | ICD-10-CM | POA: Diagnosis not present

## 2017-07-29 DIAGNOSIS — R188 Other ascites: Secondary | ICD-10-CM

## 2017-07-29 LAB — COMPREHENSIVE METABOLIC PANEL
ALBUMIN: 2.5 g/dL — AB (ref 3.5–5.2)
ALT: 21 U/L (ref 0–35)
AST: 52 U/L — ABNORMAL HIGH (ref 0–37)
Alkaline Phosphatase: 208 U/L — ABNORMAL HIGH (ref 39–117)
BILIRUBIN TOTAL: 2.1 mg/dL — AB (ref 0.2–1.2)
BUN: 19 mg/dL (ref 6–23)
CALCIUM: 8.9 mg/dL (ref 8.4–10.5)
CHLORIDE: 101 meq/L (ref 96–112)
CO2: 29 meq/L (ref 19–32)
Creatinine, Ser: 1.04 mg/dL (ref 0.40–1.20)
GFR: 54.82 mL/min — AB (ref >60.00)
Glucose, Bld: 139 mg/dL — ABNORMAL HIGH (ref 70–99)
Potassium: 3.4 mEq/L — ABNORMAL LOW (ref 3.5–5.1)
SODIUM: 138 meq/L (ref 135–145)
Total Protein: 8 g/dL (ref 6.0–8.3)

## 2017-07-29 MED ORDER — POTASSIUM CHLORIDE ER 10 MEQ PO TBCR: 10.0000 meq | EXTENDED_RELEASE_TABLET | Freq: Every day | ORAL | 0 refills | Status: DC

## 2017-07-29 MED ORDER — FUROSEMIDE 20 MG PO TABS: 20.0000 mg | ORAL_TABLET | Freq: Every day | ORAL | 0 refills | Status: DC

## 2017-07-29 NOTE — Progress Notes (Signed)
Subjective:    Patient ID: Susan Greer, female    DOB: 07-Dec-1940, 76 y.o.   MRN: [id number]  CC: Susan Greer is a 76 y.o. female who presents today for follow up.   HPI: Last visit started on vancomycin for cdiff, no more diarrhea. However states feels more constipation. Last BM yesterday. 'Small, hard, little amounts. '  Still complains of LE edema. No real improvement in swelling however redness has improved. Clear fluid dripping from left leg. No SOB, CP. Weight stable. Thinks worsened with amlodipine as well.  Finishes doxycycline at week of week.   Cirrhosis- See Susan Greer tomorrow to establish care.     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 Discharged on doxycycline. She was given 40 mg of Lasix in ED and 40 mg Lasix in the next 3 days. Discharged also on potassium supplementation. K 3.1 , Co2 25 - 07/23/17; ( one week prior K 3.6)  Negative troponin.   [place] biltaeral negative DVT Abd with chest- bilateral pleural effusions are unchanged.    HISTORY:  Past Medical History:  Diagnosis Date  . GERD (gastroesophageal reflux disease)   . Hypertension   . Kidney stones   . SVT (supraventricular tachycardia) (HCC)    Dr. Lady Greer at [place], s/p adenosine  . UTI (urinary tract infection)    Past Surgical History:  Procedure Laterality Date  . ABDOMINAL HYSTERECTOMY     Dr. Weston Anna, for fibroid tumor and endometriosis  . ESOPHAGOGASTRODUODENOSCOPY (EGD) WITH PROPOFOL N/A 01/27/2017   Procedure: ESOPHAGOGASTRODUODENOSCOPY (EGD) WITH PROPOFOL;  Surgeon: Susan Jun, MD;  Location: [facility] ENDOSCOPY;  Service: Endoscopy;  Laterality: N/A;   Family History  Problem Relation Age of Onset  . Hypertension Mother   . Cancer Mother 68       breast  . Hypertension Father   . Pneumonia Father   . Diabetes Brother   . Hypertension Brother   . Cancer Sister 30       breast    Allergies: Patient has no known  allergies. Current Outpatient Prescriptions on File Prior to Visit  Medication Sig Dispense Refill  . cholecalciferol (VITAMIN D) 1000 units tablet Take 1,000 Units by mouth daily.    . clorazepate (TRANXENE) 7.5 MG tablet Take 7.5 mg by mouth 2 (two) times daily as needed for anxiety.    Marland Kitchen doxycycline (VIBRAMYCIN) 100 MG capsule Take 1 capsule (100 mg total) by mouth 2 (two) times daily. 20 capsule 0  . hydrocortisone (ANUSOL-HC) 2.5 % rectal cream Place 1 application rectally 2 (two) times daily. (Patient taking differently: Place 1 application rectally 2 (two) times daily as needed for hemorrhoids. ) 30 g 0  . hydrocortisone (ANUSOL-HC) 25 MG suppository Place 1 suppository (25 mg total) rectally 2 (two) times daily. (Patient taking differently: Place 25 mg rectally 2 (two) times daily as needed for hemorrhoids. ) 12 suppository 0  . metoprolol (TOPROL-XL) 200 MG 24 hr tablet Take 200 mg by mouth daily.    . Multiple Vitamins-Minerals (PRESERVISION AREDS 2+MULTI VIT) CAPS Take 1 capsule by mouth 2 (two) times daily.    Marland Kitchen spironolactone (ALDACTONE) 25 MG tablet Take 1 tablet (25 mg total) by mouth daily. 30 tablet 1  . vancomycin (VANCOCIN) 125 MG capsule Take 1 capsule (125 mg total) by mouth 4 (four) times daily. 40 capsule 0   No current facility-administered medications on file prior to visit.     Social History  Substance Use Topics  . Smoking status: Never Smoker  . Smokeless tobacco: Never Used  . Alcohol use No    Review of Systems  Constitutional: Negative for chills and fever.  Respiratory: Negative for cough and shortness of breath.   Cardiovascular: Positive for leg swelling. Negative for chest pain and palpitations.  Gastrointestinal: Negative for nausea and vomiting.  Skin: Positive for rash and wound.      Objective:    BP 128/68   Pulse 79   Temp 97.6 F (36.4 C) (Oral)   Ht  (1.6 m)   Wt 135 lb 3.2 oz (61.3 kg)   SpO2 97%   BMI 23.95 kg/m  BP Readings  from Last 3 Encounters:  07/29/17 128/68  07/23/17 140/65  07/08/17 140/72   Wt Readings from Last 3 Encounters:  07/29/17 135 lb 3.2 oz (61.3 kg)  07/23/17 135 lb (61.2 kg)  07/08/17 135 lb 3.2 oz (61.3 kg)    Physical Exam  Constitutional: She appears well-developed and well-nourished.  Eyes: Conjunctivae are normal.  Cardiovascular: Normal rate, regular rhythm, normal heart sounds and normal pulses.   BLE +2 edema. No palpable cords or masses. erythema dorsal aspect of LLE. No increased warmth.  Slight asymmetry in calf size when compared bilaterally, L >R.  LE hair growth symmetric and present. LE warm and palpable pedal pulses.    Pulmonary/Chest: Effort normal and breath sounds normal. She has no wheezes. She has no rhonchi. She has no rales.  Neurological: She is alert.  Skin: Skin is warm and dry.     Scab noted on LLE as noted on diagramm. Dripping clear fluid seen from area. No increased warmth.   Psychiatric: She has a normal mood and affect. Her speech is normal and behavior is normal. Thought content normal.  Vitals reviewed.      Assessment & Plan:   Problem List Items Addressed This Visit      Digestive   Cirrhosis of liver (HCC) - Primary    Stable however discussed at length overall worsening as evidenced by retention of fluid the past few months. Sees [employer]. In the meantime, will check CMP and continue patient on lasix and KCl unless potassium shows otherwise today; repeat BMP on Monday. Patient understand gravity of hypokalemia and importance to watch carefully.       Relevant Medications   potassium chloride (K-DUR) 10 MEQ tablet   furosemide (LASIX) 20 MG tablet   Other Relevant Orders   Comprehensive metabolic panel   Basic metabolic panel     Other   Diarrhea    Pleased that has resolved. Advised probiotics and colace since now experiencing hard stools. Advised that concerned with laxatives due to hypokalemia and would opt for  more conservative therapy -slight increase in water and increase in walking to move bowels. Patient verbalized understanding.       Cellulitis    Afebrile. Reassured as appears to be resolving on doxycycline. Patient will finish medication another 4 days and she will give me a call on Friday to let me know if thinks we should continue abx therapy.hesitant to reorder antibiotic or change antibiotic therapy at this time  in the absence of worsening symptoms, in the context of recent C. Difficile.  Return precautions given.          I have discontinued Ms. Tacker's amLODipine and furosemide. I am also having her start on furosemide. Additionally, I am having her maintain her spironolactone, hydrocortisone,  hydrocortisone, vancomycin, clorazepate, metoprolol, PRESERVISION AREDS 2+MULTI VIT, cholecalciferol, doxycycline, and potassium chloride.   Meds ordered this encounter  Medications  . potassium chloride (K-DUR) 10 MEQ tablet    Sig: Take 1 tablet (10 mEq total) by mouth daily.    Dispense:  30 tablet    Refill:  0    Order Specific Question:   Supervising Provider    Answer:   Duncan Dull L [[id number]]  . furosemide (LASIX) 20 MG tablet    Sig: Take 1 tablet (20 mg total) by mouth daily.    Dispense:  30 tablet    Refill:  0    Order Specific Question:   Supervising Provider    Answer:   Sherlene Shams [[id number]]    Return precautions given.   Risks, benefits, and alternatives of the medications and treatment plan prescribed today were discussed, and patient expressed understanding.   Education regarding symptom management and diagnosis given to patient on AVS.  Continue to follow with Allegra Grana, FNP for routine health maintenance.   Susan Greer and I agreed with plan.   Rennie Plowman, FNP

## 2017-07-29 NOTE — Telephone Encounter (Signed)
Patient was notified at appointment

## 2017-07-29 NOTE — Assessment & Plan Note (Signed)
Stable however discussed at length overall worsening as evidenced by retention of fluid the past few months. Sees [employer]. In the meantime, will check CMP and continue patient on lasix and KCl unless potassium shows otherwise today; repeat BMP on Monday. Patient understand gravity of hypokalemia and importance to watch carefully.

## 2017-07-29 NOTE — Patient Instructions (Addendum)
Colace. Slight increase in water. More activity to awaken bowels  Labs today and again in Monday.   Finish doxcycyline and let me know on Friday if think need longer course of antibiotics.   Ensure to take probiotics while on antibiotics and also for 2 weeks after completion. It is important to re-colonize the gut with good bacteria and also to prevent any diarrheal infections associated with antibiotic use.

## 2017-07-29 NOTE — Assessment & Plan Note (Signed)
Afebrile. Reassured as appears to be resolving on doxycycline. Patient will finish medication another 4 days and she will give me a call on Friday to let me know if thinks we should continue abx therapy.hesitant to reorder antibiotic or change antibiotic therapy at this time  in the absence of worsening symptoms, in the context of recent C. Difficile.  Return precautions given.

## 2017-07-29 NOTE — Progress Notes (Signed)
Pre visit review using our clinic review tool, if applicable. No additional management support is needed unless otherwise documented below in the visit note. 

## 2017-07-29 NOTE — Assessment & Plan Note (Signed)
Pleased that has resolved. Advised probiotics and colace since now experiencing hard stools. Advised that concerned with laxatives due to hypokalemia and would opt for more conservative therapy -slight increase in water and increase in walking to move bowels. Patient verbalized understanding.

## 2017-08-04 ENCOUNTER — Telehealth: Payer: Self-pay | Admitting: Family

## 2017-08-04 DIAGNOSIS — K7460 Unspecified cirrhosis of liver: Secondary | ICD-10-CM

## 2017-08-04 DIAGNOSIS — R188 Other ascites: Principal | ICD-10-CM

## 2017-08-04 NOTE — Telephone Encounter (Signed)
Pt called about already having labs done on Friday at Dr Mechele Collin office. Pt would like to know if she needs to have the BMP done? Please advise?  Call pt @ [phone]. Thank you!

## 2017-08-04 NOTE — Telephone Encounter (Signed)
Dr. Mechele Collin office needs to send labs to [place] before hand. If not she will need to do new BMP.

## 2017-08-05 NOTE — Telephone Encounter (Signed)
Patient stated she will have labs sent to [place] and also she wants a referral to a nutritionist.

## 2017-08-06 ENCOUNTER — Telehealth: Payer: Self-pay | Admitting: Family

## 2017-08-06 DIAGNOSIS — K7460 Unspecified cirrhosis of liver: Secondary | ICD-10-CM

## 2017-08-06 DIAGNOSIS — R188 Other ascites: Principal | ICD-10-CM

## 2017-08-06 NOTE — Telephone Encounter (Signed)
Call pt  Reviewed labs  Please advise her to take potassium EVERY OTHER DAY , not every day. She may stay on spirolactone, lasix.   Can you notate on chart please?

## 2017-08-06 NOTE — Telephone Encounter (Signed)
Referral to nutrition placed

## 2017-08-06 NOTE — Telephone Encounter (Signed)
Patient advised of result and verbalized and understanding.

## 2017-08-19 ENCOUNTER — Ambulatory Visit: Payer: Medicare Other | Admitting: Gastroenterology

## 2017-08-26 ENCOUNTER — Telehealth: Payer: Self-pay

## 2017-08-26 DIAGNOSIS — K7460 Unspecified cirrhosis of liver: Secondary | ICD-10-CM

## 2017-08-26 DIAGNOSIS — R188 Other ascites: Principal | ICD-10-CM

## 2017-08-26 NOTE — Telephone Encounter (Signed)
Patient has been scheduled for BMP on Friday

## 2017-08-26 NOTE — Telephone Encounter (Signed)
Thanks Bethann Berkshiretrisha  She may take less of lasix of course- either split tab if scored to 10mg  OR she may take EVERY OTHER DAY and see if symptoms improve.   Please also ask her to have electrolytes checked this week. Pending bmp. Remind her she need to do this monthly since on aggressive diuretic therapy.   Based on prior BPs, does appear lower than normal.   BP Readings from Last 3 Encounters:  07/29/17 128/68  07/23/17 140/65  07/08/17 140/72

## 2017-08-26 NOTE — Telephone Encounter (Signed)
Called and triaged patient. She was concerned that her blood pressure was running low due to lasix. Patient said that she has been feeling tired and get dizzy every now and then when she switches position too fast. Patient says that her BP is usually 128-130/70. She checked it one day last week and it was 99/55. Yesterday at urology her blood pressure was 117/68. She is not having any symptoms at this time. I advised patient to record BP at least once a day for a week and call in with readings. Patient agreed to comply and also agreed to seek evaluation if she developed any symptoms.       Copied from CRM [id number]. Topic: Quick Communication - See Telephone Encounter >> Aug 25, 2017  2:21 PM Guinevere FerrariMorris, Sharamare E, NT wrote: CRM for notification. See Telephone encounter for: 08/25/17. Pt. Called and was concerned about her blood pressure being low and wanted to know if she could cut back on her medication since its so low. Medication is furosemide (LASIX) 20 MG tablet. Pt. Would like a call back.

## 2017-08-27 ENCOUNTER — Encounter: Payer: Self-pay | Admitting: Dietician

## 2017-08-27 ENCOUNTER — Encounter: Payer: Medicare Other | Attending: Family | Admitting: Dietician

## 2017-08-27 VITALS — Ht 63.0 in | Wt 119.5 lb

## 2017-08-27 DIAGNOSIS — R188 Other ascites: Secondary | ICD-10-CM | POA: Diagnosis not present

## 2017-08-27 DIAGNOSIS — Z713 Dietary counseling and surveillance: Secondary | ICD-10-CM | POA: Insufficient documentation

## 2017-08-27 DIAGNOSIS — K7460 Unspecified cirrhosis of liver: Secondary | ICD-10-CM | POA: Diagnosis not present

## 2017-08-27 NOTE — Progress Notes (Signed)
Medical Nutrition Therapy: Visit start time: 1330  end time: 1450  Assessment:  Diagnosis: cirrhosis with ascites Past medical history: HTN, GERD Psychosocial issues/ stress concerns: none Preferred learning method:  . Auditory  Current weight: 119.5lbs  Height: 5 3  Medications, supplements: reconciled list in medical record  Progress and evaluation: Patient reports being diagnosed with fatty liver in 2011; no issues until recent diagnosis of cirrhosis; will have ultrasound 09/09/17 and possible biopsy due to atypical pattern for cirrhosis. Patient and family report confusion over diet for cirrhosis. Reports normal weigh of 124-125lbs; was up to 140lbs with fluid retention, and has lost about 20lbs. She denies any recent changes in appetite, but son and daughter in law have been helping make significant diet changes to decrease fat, sodium, as well as processed carbs and sugars.     Physical activity: none   Dietary Intake:  Usual eating pattern includes 2-3 meals and 1-2 snacks per day. Dining out frequency: 1 meals per week.  Breakfast: oatmeal with strawberries, or honey nut cheerios with berries, 2 boiled eggs  Snack: sometimes fruit, nuts Lunch: sometimes brunch; sometimes eats 2 meals. Often fruit; peanut butter and honey sandwich on whole wheat; soup (people bring) Snack: none Supper: daughter cooks-- trying for 2 fish meals weekly-- baked/ broiled, salmon, halibut, red meat 1 time a week, otherwise chicken Snack: 2 crackers plain; was eating ice cream Beverages: water, lemonade Simply Light, diluted green tea or ginger ale; rarely other beverage such as sprite.   Nutrition Care Education: Topics covered: cirrhosis Basic nutrition: basic food groups, appropriate nutrient balance, appropriate meal and snack schedule, general nutrition guidelines    Cirrhosis: low sodium food choices and strategies, importance of adequate protein, healthy carb choices and portions, strategies for  controlling sugar while allowing for occasional treats.  Nutritional Diagnosis:  [place]-1.4 Altered GI function As related to cirrhosis.  As evidenced by MD diagnosis, fluid retention/ ascites, weight fluctuation.  Intervention: Instruction as noted above.   Set goals with input from patient and family.   No follow-up scheduled at this time, patient will schedule later if needed.  Education Materials given:  . Plate Planner . Cirrhosis Nutrition Therapy (NCM) . Following a Low Sodium Diet . Goals/ instructions  Learner/ who was taught:  . Patient  . Family member: son and daughter-in-law  Level of understanding: Marland Kitchen. Verbalizes/ demonstrates competency  Demonstrated degree of understanding via:   Teach back Learning barriers: . None  Willingness to learn/ readiness for change: . Eager, change in progress  Monitoring and Evaluation:  Dietary intake, exercise, liver health, and body weight      follow up: prn

## 2017-08-27 NOTE — Patient Instructions (Signed)
   Make sure to eat something every 3-4 hours during the day.   Include lean protein (meat, eggs, lowfat cheese, beans, or nuts) along with a moderate portion of starch and a vegetable or fruit with each meal.  Find ways to do light exercise/ moving during the day.   Keep up the healthy food choices and low sodium eating, great job!

## 2017-08-29 ENCOUNTER — Other Ambulatory Visit: Payer: Medicare Other

## 2017-09-01 ENCOUNTER — Encounter: Payer: Self-pay | Admitting: Family

## 2017-09-04 NOTE — Telephone Encounter (Signed)
Patient is taking 25 mg of spironolactone and 40 mg of furosemide per Dr. Mechele CollinElliott. Her blood pressure is no longer running low and she is asymptomatic. Advised patient to call our office if she develops any symptoms or had any questions.

## 2017-09-04 NOTE — Telephone Encounter (Signed)
Left message to call back regarding last my chart message

## 2017-09-22 ENCOUNTER — Other Ambulatory Visit
Admission: RE | Admit: 2017-09-22 | Discharge: 2017-09-22 | Disposition: A | Discharge status: Home / Self Care | Payer: Medicare Other | Source: Ambulatory Visit | Attending: Student | Admitting: Student

## 2017-09-22 DIAGNOSIS — R410 Disorientation, unspecified: Secondary | ICD-10-CM | POA: Diagnosis present

## 2017-09-22 LAB — AMMONIA: Ammonia: 55 umol/L — ABNORMAL HIGH (ref 9–35)

## 2017-10-13 ENCOUNTER — Other Ambulatory Visit: Payer: Self-pay

## 2017-10-13 MED ORDER — METOPROLOL SUCCINATE ER 200 MG PO TB24: 200.0000 mg | ORAL_TABLET | Freq: Every day | ORAL | 2 refills | Status: DC

## 2017-11-17 ENCOUNTER — Inpatient Hospital Stay
Admission: AD | Admit: 2017-11-17 | Discharge: 2017-11-24 | DRG: 682 | Disposition: A | Discharge status: Hospice | Payer: Medicare Other | Source: Ambulatory Visit | Attending: Internal Medicine | Admitting: Internal Medicine

## 2017-11-17 ENCOUNTER — Other Ambulatory Visit: Payer: Self-pay

## 2017-11-17 DIAGNOSIS — E8809 Other disorders of plasma-protein metabolism, not elsewhere classified: Secondary | ICD-10-CM | POA: Diagnosis present

## 2017-11-17 DIAGNOSIS — Z803 Family history of malignant neoplasm of breast: Secondary | ICD-10-CM | POA: Diagnosis not present

## 2017-11-17 DIAGNOSIS — E8770 Fluid overload, unspecified: Secondary | ICD-10-CM | POA: Diagnosis present

## 2017-11-17 DIAGNOSIS — N816 Rectocele: Secondary | ICD-10-CM | POA: Diagnosis present

## 2017-11-17 DIAGNOSIS — R188 Other ascites: Secondary | ICD-10-CM

## 2017-11-17 DIAGNOSIS — K7460 Unspecified cirrhosis of liver: Secondary | ICD-10-CM | POA: Diagnosis present

## 2017-11-17 DIAGNOSIS — I471 Supraventricular tachycardia: Secondary | ICD-10-CM | POA: Diagnosis present

## 2017-11-17 DIAGNOSIS — Z8249 Family history of ischemic heart disease and other diseases of the circulatory system: Secondary | ICD-10-CM

## 2017-11-17 DIAGNOSIS — N138 Other obstructive and reflux uropathy: Secondary | ICD-10-CM | POA: Diagnosis present

## 2017-11-17 DIAGNOSIS — F329 Major depressive disorder, single episode, unspecified: Secondary | ICD-10-CM | POA: Diagnosis present

## 2017-11-17 DIAGNOSIS — N815 Vaginal enterocele: Secondary | ICD-10-CM | POA: Diagnosis present

## 2017-11-17 DIAGNOSIS — N179 Acute kidney failure, unspecified: Secondary | ICD-10-CM | POA: Diagnosis present

## 2017-11-17 DIAGNOSIS — R64 Cachexia: Secondary | ICD-10-CM | POA: Diagnosis present

## 2017-11-17 DIAGNOSIS — N8110 Cystocele, unspecified: Secondary | ICD-10-CM | POA: Diagnosis present

## 2017-11-17 DIAGNOSIS — K767 Hepatorenal syndrome: Secondary | ICD-10-CM | POA: Diagnosis present

## 2017-11-17 DIAGNOSIS — K7290 Hepatic failure, unspecified without coma: Secondary | ICD-10-CM | POA: Diagnosis present

## 2017-11-17 DIAGNOSIS — I10 Essential (primary) hypertension: Secondary | ICD-10-CM | POA: Diagnosis present

## 2017-11-17 DIAGNOSIS — Z7189 Other specified counseling: Secondary | ICD-10-CM

## 2017-11-17 DIAGNOSIS — K219 Gastro-esophageal reflux disease without esophagitis: Secondary | ICD-10-CM | POA: Diagnosis present

## 2017-11-17 DIAGNOSIS — Z515 Encounter for palliative care: Secondary | ICD-10-CM | POA: Diagnosis not present

## 2017-11-17 DIAGNOSIS — Z833 Family history of diabetes mellitus: Secondary | ICD-10-CM

## 2017-11-17 DIAGNOSIS — Z87442 Personal history of urinary calculi: Secondary | ICD-10-CM

## 2017-11-17 LAB — COMPREHENSIVE METABOLIC PANEL
ALK PHOS: 174 U/L — AB (ref 38–126)
ALT: 20 U/L (ref 14–54)
AST: 43 U/L — ABNORMAL HIGH (ref 15–41)
Albumin: 2.1 g/dL — ABNORMAL LOW (ref 3.5–5.0)
Anion gap: 9 (ref 5–15)
BUN: 85 mg/dL — ABNORMAL HIGH (ref 6–20)
CALCIUM: 8.3 mg/dL — AB (ref 8.9–10.3)
CO2: 24 mmol/L (ref 22–32)
Chloride: 103 mmol/L (ref 101–111)
Creatinine, Ser: 4.41 mg/dL — ABNORMAL HIGH (ref 0.44–1.00)
GFR calc non Af Amer: 9 mL/min — ABNORMAL LOW (ref >60)
GFR, EST AFRICAN AMERICAN: 10 mL/min — AB (ref >60)
Glucose, Bld: 138 mg/dL — ABNORMAL HIGH (ref 65–99)
Potassium: 4.9 mmol/L (ref 3.5–5.1)
SODIUM: 136 mmol/L (ref 135–145)
Total Bilirubin: 1.7 mg/dL — ABNORMAL HIGH (ref 0.3–1.2)
Total Protein: 7.3 g/dL (ref 6.5–8.1)

## 2017-11-17 LAB — CBC WITH DIFFERENTIAL/PLATELET
Basophils Absolute: 0.1 10*3/uL (ref 0–0.1)
Basophils Relative: 1 %
Eosinophils Absolute: 0.4 10*3/uL (ref 0–0.7)
Eosinophils Relative: 4 %
HCT: 38.1 % (ref 35.0–47.0)
Hemoglobin: 13 g/dL (ref 12.0–16.0)
Lymphocytes Relative: 12 %
Lymphs Abs: 1.2 10*3/uL (ref 1.0–3.6)
MCH: 34.3 pg — ABNORMAL HIGH (ref 26.0–34.0)
MCHC: 34.1 g/dL (ref 32.0–36.0)
MCV: 100.7 fL — ABNORMAL HIGH (ref 80.0–100.0)
Monocytes Absolute: 1 10*3/uL — ABNORMAL HIGH (ref 0.2–0.9)
Monocytes Relative: 10 %
Neutro Abs: 7.3 10*3/uL — ABNORMAL HIGH (ref 1.4–6.5)
Neutrophils Relative %: 73 %
Platelets: 141 10*3/uL — ABNORMAL LOW (ref 150–440)
RBC: 3.79 MIL/uL — ABNORMAL LOW (ref 3.80–5.20)
RDW: 16.4 % — ABNORMAL HIGH (ref 11.5–14.5)
WBC: 9.9 10*3/uL (ref 3.6–11.0)

## 2017-11-17 MED ORDER — MORPHINE SULFATE (PF) 2 MG/ML IV SOLN
INTRAVENOUS | Status: AC
  Filled 2017-11-17: qty 1

## 2017-11-17 MED ORDER — MORPHINE SULFATE (PF) 2 MG/ML IV SOLN
2.0000 mg | INTRAVENOUS | Status: DC | PRN
  Administered 2017-11-17 – 2017-11-22 (×5): 2 mg via INTRAVENOUS
  Filled 2017-11-17 (×4): qty 1

## 2017-11-17 MED ORDER — ACETAMINOPHEN 325 MG PO TABS
650.0000 mg | ORAL_TABLET | Freq: Four times a day (QID) | ORAL | Status: DC | PRN
  Filled 2017-11-17: qty 2

## 2017-11-17 MED ORDER — SODIUM CHLORIDE 0.9 % IV SOLN
INTRAVENOUS | Status: DC
  Administered 2017-11-17 – 2017-11-18 (×2): via INTRAVENOUS

## 2017-11-17 MED ORDER — ACETAMINOPHEN 650 MG RE SUPP: 650.0000 mg | Freq: Four times a day (QID) | RECTAL | Status: DC | PRN

## 2017-11-17 MED ORDER — METOPROLOL SUCCINATE ER 50 MG PO TB24
200.0000 mg | ORAL_TABLET | Freq: Every day | ORAL | Status: DC
  Administered 2017-11-18 – 2017-11-21 (×2): 200 mg via ORAL
  Filled 2017-11-17 (×6): qty 4

## 2017-11-17 MED ORDER — SERTRALINE HCL 50 MG PO TABS
50.0000 mg | ORAL_TABLET | Freq: Every day | ORAL | Status: DC
  Filled 2017-11-17 (×4): qty 1

## 2017-11-17 MED ORDER — ONDANSETRON HCL 4 MG/2ML IJ SOLN
4.0000 mg | Freq: Four times a day (QID) | INTRAMUSCULAR | Status: DC | PRN
  Administered 2017-11-19 – 2017-11-23 (×3): 4 mg via INTRAVENOUS
  Filled 2017-11-17 (×3): qty 2

## 2017-11-17 MED ORDER — POLYETHYLENE GLYCOL 3350 17 G PO PACK
17.0000 g | PACK | Freq: Every day | ORAL | Status: DC | PRN
  Administered 2017-11-22 – 2017-11-23 (×2): 17 g via ORAL
  Filled 2017-11-17 (×2): qty 1

## 2017-11-17 MED ORDER — ONDANSETRON HCL 4 MG PO TABS: 4.0000 mg | ORAL_TABLET | Freq: Four times a day (QID) | ORAL | Status: DC | PRN

## 2017-11-17 MED ORDER — LIDOCAINE HCL 2 % EX GEL
1.0000 "application " | CUTANEOUS | Status: AC
  Administered 2017-11-17: 1 via URETHRAL
  Filled 2017-11-17: qty 5

## 2017-11-17 MED ORDER — CLORAZEPATE DIPOTASSIUM 7.5 MG PO TABS: 7.5000 mg | ORAL_TABLET | Freq: Two times a day (BID) | ORAL | Status: DC | PRN

## 2017-11-17 MED ORDER — PANTOPRAZOLE SODIUM 40 MG PO TBEC
40.0000 mg | DELAYED_RELEASE_TABLET | Freq: Every day | ORAL | Status: DC
  Administered 2017-11-18 – 2017-11-24 (×7): 40 mg via ORAL
  Filled 2017-11-17 (×7): qty 1

## 2017-11-17 MED ORDER — ALBUTEROL SULFATE (2.5 MG/3ML) 0.083% IN NEBU: 2.5000 mg | INHALATION_SOLUTION | RESPIRATORY_TRACT | Status: DC | PRN

## 2017-11-17 MED ORDER — HYDROCODONE-ACETAMINOPHEN 5-325 MG PO TABS
1.0000 | ORAL_TABLET | ORAL | Status: DC | PRN
  Administered 2017-11-17 – 2017-11-23 (×10): 1 via ORAL
  Filled 2017-11-17 (×10): qty 1

## 2017-11-17 MED ORDER — HEPARIN SODIUM (PORCINE) 5000 UNIT/ML IJ SOLN
5000.0000 [IU] | Freq: Three times a day (TID) | INTRAMUSCULAR | Status: DC
  Administered 2017-11-17 – 2017-11-19 (×5): 5000 [IU] via SUBCUTANEOUS
  Filled 2017-11-17 (×5): qty 1

## 2017-11-17 NOTE — H&P (Addendum)
[facility] [place] [facility]   PATIENT NAME: Susan Greer    MR#:  [id number]  DATE OF BIRTH:  06/27/1941  DATE OF ADMISSION:  11/17/2017  PRIMARY CARE PHYSICIAN: Allegra Grana, FNP   REQUESTING/REFERRING PHYSICIAN:   CHIEF COMPLAINT:  No chief complaint on file.  AKI  HISTORY OF PRESENT ILLNESS:  Susan Greer  is a 77 y.o. female with a known history of hypertension, cirrhosis, lower extremity swelling on Lasix since October comes in as a direct admission from nephrology office due to elevated BUN and creatinine.  Patient has had difficulty urinating.  She has had a uterine prolapse for a long time.  She has seen gynecologist at [facility] and was thought to be a high risk candidate and conservative management was advised.  Today bladder scan here in the hospital shows 650 mL in the bladder. Patient's baseline creatinine is 1 entered at the nephrology office it was greater than 3.  Patient has no lower examinee edema.  She has been using a topical antibiotic cream for left leg cellulitis which is resolved.  No hematuria.  Does not use NSAIDs.  No fever. She also feels her abdomen is more distended than normal.   PAST MEDICAL HISTORY:   Past Medical History:  Diagnosis Date  . GERD (gastroesophageal reflux disease)   . Hypertension   . Kidney stones   . SVT (supraventricular tachycardia) (HCC)    Dr. Lady Gary at [place], s/p adenosine  . UTI (urinary tract infection)     PAST SURGICAL HISTORY:   Past Surgical History:  Procedure Laterality Date  . ABDOMINAL HYSTERECTOMY     Dr. Weston Anna, for fibroid tumor and endometriosis  . ESOPHAGOGASTRODUODENOSCOPY (EGD) WITH PROPOFOL N/A 01/27/2017   Procedure: ESOPHAGOGASTRODUODENOSCOPY (EGD) WITH PROPOFOL;  Surgeon: Scot Jun, MD;  Location: [facility] ENDOSCOPY;  Service: Endoscopy;  Laterality: N/A;    SOCIAL HISTORY:   Social History   Tobacco Use  . Smoking status: Never Smoker  . Smokeless tobacco: Never  Used  Substance Use Topics  . Alcohol use: No    FAMILY HISTORY:   Family History  Problem Relation Age of Onset  . Hypertension Mother   . Cancer Mother 76       breast  . Hypertension Father   . Pneumonia Father   . Diabetes Brother   . Hypertension Brother   . Cancer Sister 30       breast    DRUG ALLERGIES:  No Known Allergies  REVIEW OF SYSTEMS:   Review of Systems  Constitutional: Positive for malaise/fatigue. Negative for chills, fever and weight loss.  HENT: Negative for hearing loss and nosebleeds.   Eyes: Negative for blurred vision, double vision and pain.  Respiratory: Negative for cough, hemoptysis, sputum production, shortness of breath and wheezing.   Cardiovascular: Negative for chest pain, palpitations, orthopnea and leg swelling.  Gastrointestinal: Negative for abdominal pain, constipation, diarrhea, nausea and vomiting.  Genitourinary: Positive for dysuria. Negative for hematuria.  Musculoskeletal: Negative for back pain, falls and myalgias.  Skin: Negative for rash.  Neurological: Positive for weakness. Negative for dizziness, tremors, sensory change, speech change, focal weakness, seizures and headaches.  Endo/Heme/Allergies: Does not bruise/bleed easily.  Psychiatric/Behavioral: Negative for depression and memory loss. The patient is not nervous/anxious.    MEDICATIONS AT HOME:   Prior to Admission medications   Medication Sig Start Date End Date Taking? Authorizing Provider  Cholecalciferol (VITAMIN D3) 1000 units CAPS Take by mouth.  [provider]  clorazepate (TRANXENE) 7.5 MG tablet Take 7.5 mg by mouth 2 (two) times daily as needed for anxiety.    [provider]  doxycycline (VIBRAMYCIN) 100 MG capsule Take 1 capsule (100 mg total) by mouth 2 (two) times daily. Patient not taking: Reported on 08/27/2017 07/23/17   Myrna Blazer, MD  furosemide (LASIX) 40 MG tablet Take by mouth. 08/01/17 08/01/18  [provider]  hydrocortisone (ANUSOL-HC) 2.5 % rectal cream Place 1 application rectally 2 (two) times daily. Patient not taking: Reported on 08/27/2017 07/08/17   Allegra Grana, FNP  hydrocortisone (ANUSOL-HC) 25 MG suppository Place 1 suppository (25 mg total) rectally 2 (two) times daily. Patient not taking: Reported on 08/27/2017 07/08/17   Allegra Grana, FNP  metoprolol (TOPROL-XL) 200 MG 24 hr tablet Take 1 tablet (200 mg total) by mouth daily. 10/13/17   Allegra Grana, FNP  Multiple Vitamins-Minerals (PRESERVISION AREDS 2+MULTI VIT) CAPS Take 1 capsule by mouth 2 (two) times daily.    [provider]  omeprazole (PRILOSEC) 40 MG capsule Take by mouth. 01/28/17 01/28/18  [provider]  potassium chloride (K-DUR) 10 MEQ tablet Take 1 tablet (10 mEq total) by mouth daily. 07/29/17   Allegra Grana, FNP  sertraline (ZOLOFT) 50 MG tablet Take by mouth.    [provider]  spironolactone (ALDACTONE) 25 MG tablet Take 1 tablet (25 mg total) by mouth daily. 06/25/17   Allegra Grana, FNP  vancomycin (VANCOCIN) 125 MG capsule Take 1 capsule (125 mg total) by mouth 4 (four) times daily. Patient not taking: Reported on 08/27/2017 07/11/17   Allegra Grana, FNP     VITAL SIGNS:  Blood pressure 136/72, pulse 73, temperature 97.7 F (36.5 C), temperature source Oral, resp. rate 20, SpO2 99 %.  PHYSICAL EXAMINATION:  Physical Exam  GENERAL:  77 y.o.-year-old patient lying in the bed with no acute distress.  EYES: Pupils equal, round, reactive to light and accommodation. No scleral icterus. Extraocular muscles intact.  HEENT: Head atraumatic, normocephalic. Oropharynx and nasopharynx clear. No oropharyngeal erythema, moist oral mucosa  NECK:  Supple, no jugular venous distention. No thyroid enlargement, no tenderness.  LUNGS: Normal breath sounds bilaterally, no wheezing, rales, rhonchi. No use of accessory muscles of respiration.  CARDIOVASCULAR: S1,  S2 normal. No murmurs, rubs, or gallops.  ABDOMEN: Soft, nontender, distended. Bowel sounds present. No organomegaly or mass.  EXTREMITIES: 1+ bilateral lower extremity edema.   NEUROLOGIC: Cranial nerves II through XII are intact. No focal Motor or sensory deficits appreciated b/l PSYCHIATRIC: The patient is alert and oriented x 3. Good affect.  SKIN: No obvious rash, lesion, or ulcer.   Patient has a prolapsed uterus with no bleeding.  LABORATORY PANEL:   CBC No results for input(s): WBC, HGB, HCT, PLT in the last 168 hours. ------------------------------------------------------------------------------------------------------------------  Chemistries  No results for input(s): NA, K, CL, CO2, GLUCOSE, BUN, CREATININE, CALCIUM, MG, AST, ALT, ALKPHOS, BILITOT in the last 168 hours.  Invalid input(s): GFRCGP ------------------------------------------------------------------------------------------------------------------  Cardiac Enzymes No results for input(s): TROPONINI in the last 168 hours. ------------------------------------------------------------------------------------------------------------------  RADIOLOGY:  No results found.   IMPRESSION AND PLAN:   * Acute kidney injury due to urethral obstruction from uterine prolapse.  Will Place Foley catheter.  Start IV fluids.  Repeat labs in the morning.  Consult nephrology.  * Prolapsed uterus.  This is causing obstruction in turn causing acute kidney injury.  Discussed with Dr. Gaynelle Arabian of gynecology who will  see the patient. [facility] and not thought to be a surgical candidate due to cirrhosis  *History of SVT.  Continue Toprol-XL.  *Ascites.  Limited ultrasound obtained.  Paracentesis will help if significant fluid collection.  DVT prophylaxis with heparin subcu  All the records are reviewed and case discussed with ED provider. Management plans discussed with the patient, family  and they are in agreement.  CODE STATUS: FULL CODE  TOTAL TIME TAKING CARE OF THIS PATIENT: 40 minutes.   Molinda BailiffSrikar R Tattiana Fakhouri M.D on 11/17/2017 at 5:22 PM  Between 7am to 6pm - Pager - [phone]  After 6pm go to [email or web address] - [email or web address]  [facility] [place] [facility]  Office  [phone]  CC: Primary care physician; Allegra GranaArnett, Margaret G, FNP  Note: This dictation was prepared with Dragon dictation along with smaller phrase technology. Any transcriptional errors that result from this process are unintentional.

## 2017-11-18 ENCOUNTER — Inpatient Hospital Stay: Payer: Medicare Other

## 2017-11-18 DIAGNOSIS — N815 Vaginal enterocele: Secondary | ICD-10-CM

## 2017-11-18 DIAGNOSIS — N179 Acute kidney failure, unspecified: Secondary | ICD-10-CM

## 2017-11-18 DIAGNOSIS — K767 Hepatorenal syndrome: Secondary | ICD-10-CM

## 2017-11-18 DIAGNOSIS — N816 Rectocele: Secondary | ICD-10-CM

## 2017-11-18 DIAGNOSIS — R188 Other ascites: Secondary | ICD-10-CM

## 2017-11-18 LAB — CBC
HCT: 35.4 % (ref 35.0–47.0)
HEMOGLOBIN: 11.9 g/dL — AB (ref 12.0–16.0)
MCH: 33.9 pg (ref 26.0–34.0)
MCHC: 33.6 g/dL (ref 32.0–36.0)
MCV: 100.8 fL — AB (ref 80.0–100.0)
Platelets: 130 10*3/uL — ABNORMAL LOW (ref 150–440)
RBC: 3.52 MIL/uL — AB (ref 3.80–5.20)
RDW: 16.1 % — ABNORMAL HIGH (ref 11.5–14.5)
WBC: 8.7 10*3/uL (ref 3.6–11.0)

## 2017-11-18 LAB — BODY FLUID CELL COUNT WITH DIFFERENTIAL
EOS FL: 0 %
Lymphs, Fluid: 59 %
MONOCYTE-MACROPHAGE-SEROUS FLUID: 12 %
Neutrophil Count, Fluid: 29 %
WBC FLUID: 233 uL

## 2017-11-18 LAB — ALBUMIN, PLEURAL OR PERITONEAL FLUID: Albumin, Fluid: <1 g/dL

## 2017-11-18 LAB — BASIC METABOLIC PANEL
ANION GAP: 6 (ref 5–15)
BUN: 85 mg/dL — ABNORMAL HIGH (ref 6–20)
CALCIUM: 8 mg/dL — AB (ref 8.9–10.3)
CHLORIDE: 107 mmol/L (ref 101–111)
CO2: 22 mmol/L (ref 22–32)
CREATININE: 4.31 mg/dL — AB (ref 0.44–1.00)
GFR calc non Af Amer: 9 mL/min — ABNORMAL LOW (ref >60)
GFR, EST AFRICAN AMERICAN: 11 mL/min — AB (ref >60)
GLUCOSE: 102 mg/dL — AB (ref 65–99)
Potassium: 4.5 mmol/L (ref 3.5–5.1)
Sodium: 135 mmol/L (ref 135–145)

## 2017-11-18 LAB — URINALYSIS, COMPLETE (UACMP) WITH MICROSCOPIC
BILIRUBIN URINE: NEGATIVE
Bacteria, UA: NONE SEEN
GLUCOSE, UA: NEGATIVE mg/dL
KETONES UR: NEGATIVE mg/dL
NITRITE: NEGATIVE
PH: 5 (ref 5.0–8.0)
Protein, ur: 30 mg/dL — AB
SPECIFIC GRAVITY, URINE: 1.012 (ref 1.005–1.030)
Squamous Epithelial / LPF: NONE SEEN

## 2017-11-18 LAB — GLUCOSE, PLEURAL OR PERITONEAL FLUID: Glucose, Fluid: 132 mg/dL

## 2017-11-18 MED ORDER — PNEUMOCOCCAL VAC POLYVALENT 25 MCG/0.5ML IJ INJ
0.5000 mL | INJECTION | INTRAMUSCULAR | Status: DC
  Filled 2017-11-18: qty 0.5

## 2017-11-18 MED ORDER — ESTROGENS, CONJUGATED 0.625 MG/GM VA CREA
1.0000 | TOPICAL_CREAM | Freq: Every day | VAGINAL | Status: DC
  Administered 2017-11-18 – 2017-11-22 (×6): 1 via VAGINAL
  Filled 2017-11-18: qty 30

## 2017-11-18 MED ORDER — ALBUMIN HUMAN 25 % IV SOLN
25.0000 g | Freq: Once | INTRAVENOUS | Status: DC
  Filled 2017-11-18: qty 100

## 2017-11-18 MED ORDER — ALBUMIN HUMAN 25 % IV SOLN
25.0000 g | Freq: Three times a day (TID) | INTRAVENOUS | Status: AC
  Administered 2017-11-18 – 2017-11-21 (×9): 25 g via INTRAVENOUS
  Filled 2017-11-18 (×10): qty 100

## 2017-11-18 MED ORDER — ALBUMIN HUMAN 25 % IV SOLN
25.0000 g | Freq: Once | INTRAVENOUS | Status: AC
  Administered 2017-11-18: 13:00:00 25 g via INTRAVENOUS
  Filled 2017-11-18: qty 100

## 2017-11-18 MED ORDER — DOCUSATE SODIUM 50 MG/5ML PO LIQD
50.0000 mg | Freq: Two times a day (BID) | ORAL | Status: DC
  Administered 2017-11-18 – 2017-11-19 (×4): 50 mg via ORAL
  Filled 2017-11-18 (×6): qty 10

## 2017-11-18 NOTE — Procedures (Signed)
US paracentesis without difficulty  Complications:  None  Blood Loss: none  See dictation in canopy pacs  

## 2017-11-18 NOTE — Progress Notes (Signed)
[facility]  ROUNDING NOTE   Subjective:   [name] admitted to [facility] on 11/17/2017 for Acute renal failure cirrhosis lower extremity cellulitis   Patient was a direct admission from my office for acute renal failure with anasarca.   Family at bedside. Given IV fluids overnight.   UOP of 90mL.   Creatinine 4.31 (4.41) - baseline creatinine of 1.04 on 07/29/2017  Objective:  Vital signs in last 24 hours:  Temp:  [97.7 F (36.5 C)-98 F (36.7 C)] 98 F (36.7 C) (01/29 0416) Pulse Rate:  [60-73] 72 (01/29 1258) Resp:  [16-20] 16 (01/29 1258) BP: (96-136)/(46-72) 96/46 (01/29 1258) SpO2:  [94 %-99 %] 99 % (01/29 1258) Weight:  [55 kg (121 lb 4.1 oz)-63.9 kg (140 lb 12.8 oz)] 55 kg (121 lb 4.1 oz) (01/29 0416)  Weight change:  Filed Weights   11/17/17 1633 11/18/17 0416  Weight: 63.9 kg (140 lb 12.8 oz) 55 kg (121 lb 4.1 oz)    Intake/Output: I/O last 3 completed shifts: In: 450 [I.V.:450] Out: 90 [Urine:90]   Intake/Output this shift:  Total I/O In: 240 [P.O.:240] Out: -   Physical Exam: General: NAD, laying in bed  Head: Normocephalic, atraumatic. Moist oral mucosal membranes  Eyes: Anicteric, PERRL  Neck: Supple, trachea midline  Lungs:  Clear to auscultation  Heart: Regular rate and rhythm  Abdomen:  +ascites  Extremities: ++ peripheral edema.  Neurologic: Nonfocal, moving all four extremities  Skin: No lesions        Basic Metabolic Panel: Recent Labs  Lab 11/17/17 1710 11/18/17 0614  NA 136 135  K 4.9 4.5  CL 103 107  CO2 24 22  GLUCOSE 138* 102*  BUN 85* 85*  CREATININE 4.41* 4.31*  CALCIUM 8.3* 8.0*    Liver Function Tests: Recent Labs  Lab 11/17/17 1710  AST 43*  ALT 20  ALKPHOS 174*  BILITOT 1.7*  PROT 7.3  ALBUMIN 2.1*   No results for input(s): LIPASE, AMYLASE in the last 168 hours. No results for input(s): AMMONIA in the last 168 hours.  CBC: Recent Labs  Lab 11/17/17 1710 11/18/17 0614  WBC 9.9 8.7   NEUTROABS 7.3*  --   HGB 13.0 11.9*  HCT 38.1 35.4  MCV 100.7* 100.8*  PLT 141* 130*    Cardiac Enzymes: No results for input(s): CKTOTAL, CKMB, CKMBINDEX, TROPONINI in the last 168 hours.  BNP: Invalid input(s): POCBNP  CBG: No results for input(s): GLUCAP in the last 168 hours.  Microbiology: Results for orders placed or performed in visit on 07/15/17  Fecal occult blood, imunochemical     Status: None   Collection Time: 07/15/17  2:46 PM  Result Value Ref Range Status   Fecal Occult Bld Negative Negative Final    Coagulation Studies: No results for input(s): LABPROT, INR in the last 72 hours.  Urinalysis: No results for input(s): COLORURINE, LABSPEC, PHURINE, GLUCOSEU, HGBUR, BILIRUBINUR, KETONESUR, PROTEINUR, UROBILINOGEN, NITRITE, LEUKOCYTESUR in the last 72 hours.  Invalid input(s): APPERANCEUR    Imaging: Koreas Renal  Result Date: 11/18/2017 CLINICAL DATA:  Acute renal failure EXAM: RENAL / URINARY TRACT ULTRASOUND COMPLETE COMPARISON:  Abdominal MRI dated January 01, 2017 FINDINGS: Right Kidney: Length: 10.5 cm. The cortical echotexture is increased diffusely and appears greater than that of the liver. There is a lower pole cyst which measures 1 cm in greatest dimension. There is no hydronephrosis. Left Kidney: Length: 10.8 cm. The renal cortical echotexture is mildly increased but not as  great low flea is that on the right. There is no hydronephrosis. Bladder: The urinary bladder is decompressed by the Foley catheter. IMPRESSION: Increased renal cortical echotexture greatest on the right compatible with medical renal disease. There is no hydronephrosis. The urinary bladder was empty due to the presence of a Foley catheter. Electronically Signed   By: David  [place] M.D.   On: 11/18/2017 12:57   US Abdomen Limited  Result Date: 11/18/2017 CLINICAL DATA:  Hepatic cirrhosis, ascites. EXAM: LIMITED ABDOMEN ULTRASOUND FOR ASCITES TECHNIQUE: Limited ultrasound survey for  ascites was performed in all four abdominal quadrants. COMPARISON:  No recent study in PACs FINDINGS: A large amount of ascites is observed throughout the abdomen. The liver where visualized appears shrunken. Bowel loops are floating in the ascites. IMPRESSION: Large amount of ascites throughout the abdominal cavity. Electronically Signed   By: David  [place] M.D.   On: 11/18/2017 08:50   US Paracentesis  Result Date: 11/18/2017 INDICATION: Ascites EXAM: ULTRASOUND GUIDED DIAGNOSTIC AND THERAPEUTIC PARACENTESIS MEDICATIONS: None. COMPLICATIONS: None immediate. PROCEDURE: Informed written consent was obtained from the patient after a discussion of the risks, benefits and alternatives to treatment. A timeout was performed prior to the initiation of the procedure. Initial ultrasound scanning demonstrates a large amount of ascites within the right lower abdominal quadrant. The right lower abdomen was prepped and draped in the usual sterile fashion. 1% lidocaine with epinephrine was used for local anesthesia. Following this, a 6 Fr Safe-T-Centesis catheter was introduced. An ultrasound image was saved for documentation purposes. The paracentesis was performed. The catheter was removed and a dressing was applied. The patient tolerated the procedure well without immediate post procedural complication. FINDINGS: A total of approximately 5 L of clear yellow fluid was removed. Samples were sent to the laboratory as requested by the clinical team. IMPRESSION: Successful ultrasound-guided paracentesis yielding 5 liters of peritoneal fluid. Electronically Signed   By: Alcide Clever M.D.   On: 11/18/2017 12:30     Medications:   . [START ON 11/19/2017] albumin human     . conjugated estrogens  1 Applicatorful Vaginal Daily  . docusate  50 mg Oral BID  . heparin  5,000 Units Subcutaneous Q8H  . metoprolol  200 mg Oral Daily  . pantoprazole  40 mg Oral Daily  . sertraline  50 mg Oral QHS   acetaminophen **OR**  acetaminophen, albuterol, clorazepate, HYDROcodone-acetaminophen, morphine injection, ondansetron **OR** ondansetron (ZOFRAN) IV, polyethylene glycol  Assessment/ Plan:  Susan Greer is a 77 y.o. white female with hypertension, liver cirrhosis possibly secondary to fatty liver disease, SVT, history of uterine fibroids, depression, and vaginal prolapse   1. Acute renal failure: baseline creatinine of 1.04 with GFR of 49 on 07/29/2017.  Acute renal failure secondary to either over diuresis versus acute hepatorenal syndrome - failed outpatient diuretic therapy - NS IV infusion with little response. - low threshold for dialysis - Appreciate palliative care input.    2. Anasarca/ascites: patient with volume overload and hypoalbuminemia - paracentesis today: removed 5 liters.  - Continue IV albumin   LOS: 1 Clotile Whittington 1/29/20194:26 PM

## 2017-11-18 NOTE — Consult Note (Signed)
Reason for Consult:Vaginal Prolapse Referring Physician: Hillary Bow MD  Susan Greer is an 77 y.o. female.  HPI: Patient is a 77yo with an impressive complete vaginal prolapse, cystocele, rectocele, and enterocele. This is certainly related to and exhasperbated by the patient's cirrhosis. The patient;'s prolapse has been present for several years. It was managed at first with a pessary, but then the pessary began to fall out regularly. This is not surprising given the patient's significant ascites. Patient has been evaluated for this prolaspe by Urogynecology at [facility]. They had discussed with the patient a vaginectomy which she was agreeable to, but given her cirrhosis, and as mentioned by her GI/Transplant physician at [facility], Dr. Angela Adam, she is a poor surgical candidate with a high chance of death after such a procedure. Dr. Angela Adam had offered the patient a liver biopsy in November of 2019 to determine the cause of her cirrhosis, but the patient desired to postpone this procedure until after the holidays. The patient's family reports that the patient's health status has fluctuated. That they know of she has never has ascites drained from her abdomen.  From review of the documents available in Care Everywhere it appears that the cause of the patient's cirrhosis is unknown. Patient does not have a history of alcohol abuse or hepatitis and HCC has not been a high concern. Today the patient was admitted with an AKI for fluid hydration. I assisted in the room in placing a foley catheter. I reduced and replaced the prolapse so that the foley catheter could be placed under sterile technique. The patient was given Urojet to make the procedure more comfortable. With placement of the foley the patient only had a small amount of urine in her bladder, approx 20cc with sediment present. Of note, the urogynecology evaluation did mention her bladder capacity to be only 75cc, which is significantly reduced.    Past Medical  History:  Diagnosis Date  . GERD (gastroesophageal reflux disease)   . Hypertension   . Kidney stones   . SVT (supraventricular tachycardia) (HCC)    Dr. Ubaldo Glassing at [facility], s/p adenosine  . UTI (urinary tract infection)     Past Surgical History:  Procedure Laterality Date  . ABDOMINAL HYSTERECTOMY     Dr. Buford Dresser, for fibroid tumor and endometriosis  . ESOPHAGOGASTRODUODENOSCOPY (EGD) WITH PROPOFOL N/A 01/27/2017   Procedure: ESOPHAGOGASTRODUODENOSCOPY (EGD) WITH PROPOFOL;  Surgeon: Manya Silvas, MD;  Location: [facility] ENDOSCOPY;  Service: Endoscopy;  Laterality: N/A;    Family History  Problem Relation Age of Onset  . Hypertension Mother   . Cancer Mother 82       breast  . Hypertension Father   . Pneumonia Father   . Diabetes Brother   . Hypertension Brother   . Cancer Sister 69       breast    Social History:  reports that  has never smoked. she has never used smokeless tobacco. She reports that she does not drink alcohol or use drugs.  Allergies: No Known Allergies  Medications: I have reviewed the patient's current medications.  Results for orders placed or performed during the hospital encounter of 11/17/17 (from the past 48 hour(s))  Comprehensive metabolic panel     Status: Abnormal   Collection Time: 11/17/17  5:10 PM  Result Value Ref Range   Sodium 136 135 - 145 mmol/L   Potassium 4.9 3.5 - 5.1 mmol/L   Chloride 103 101 - 111 mmol/L   CO2 24 22 - 32 mmol/L  Glucose, Bld 138 (H) 65 - 99 mg/dL   BUN 85 (H) 6 - 20 mg/dL   Creatinine, Ser 4.41 (H) 0.44 - 1.00 mg/dL   Calcium 8.3 (L) 8.9 - 10.3 mg/dL   Total Protein 7.3 6.5 - 8.1 g/dL   Albumin 2.1 (L) 3.5 - 5.0 g/dL   AST 43 (H) 15 - 41 U/L   ALT 20 14 - 54 U/L   Alkaline Phosphatase 174 (H) 38 - 126 U/L   Total Bilirubin 1.7 (H) 0.3 - 1.2 mg/dL   GFR calc non Af Amer 9 (L) >60 mL/min   GFR calc Af Amer 10 (L) >60 mL/min    Comment: (NOTE) The eGFR has been calculated using the CKD EPI  equation. This calculation has not been validated in all clinical situations. eGFR's persistently <60 mL/min signify possible Chronic Kidney Disease.    Anion gap 9 5 - 15    Comment: Performed at [facility], [address] [place], [place] [address]  CBC with Differential/Platelet     Status: Abnormal   Collection Time: 11/17/17  5:10 PM  Result Value Ref Range   WBC 9.9 3.6 - 11.0 K/uL   RBC 3.79 (L) 3.80 - 5.20 MIL/uL   Hemoglobin 13.0 12.0 - 16.0 g/dL   HCT 38.1 35.0 - 47.0 %   MCV 100.7 (H) 80.0 - 100.0 fL   MCH 34.3 (H) 26.0 - 34.0 pg   MCHC 34.1 32.0 - 36.0 g/dL   RDW 16.4 (H) 11.5 - 14.5 %   Platelets 141 (L) 150 - 440 K/uL   Neutrophils Relative % 73 %   Neutro Abs 7.3 (H) 1.4 - 6.5 K/uL   Lymphocytes Relative 12 %   Lymphs Abs 1.2 1.0 - 3.6 K/uL   Monocytes Relative 10 %   Monocytes Absolute 1.0 (H) 0.2 - 0.9 K/uL   Eosinophils Relative 4 %   Eosinophils Absolute 0.4 0 - 0.7 K/uL   Basophils Relative 1 %   Basophils Absolute 0.1 0 - 0.1 K/uL    Comment: Performed at [facility], [address] [place], [place] [address]    No results found.  Review of Systems  Constitutional: Negative for chills, fever, malaise/fatigue and weight loss.  HENT: Negative for congestion, hearing loss and sinus pain.   Eyes: Negative for blurred vision and double vision.  Respiratory: Negative for cough, sputum production, shortness of breath and wheezing.   Cardiovascular: Negative for chest pain, palpitations, orthopnea and leg swelling.  Gastrointestinal: Positive for abdominal pain. Negative for constipation, diarrhea, nausea and vomiting.  Genitourinary: Positive for frequency. Negative for dysuria, flank pain, hematuria and urgency.  Musculoskeletal: Negative for back pain, falls and joint pain.  Skin: Negative for itching and rash.  Neurological: Negative for dizziness and headaches.  Psychiatric/Behavioral: Negative for depression, substance abuse and  suicidal ideas. The patient is not nervous/anxious.    Blood pressure 112/66, pulse 60, temperature 98 F (36.7 C), temperature source Oral, resp. rate 18, height 5' 3"  (1.6 m), weight 121 lb 4.1 oz (55 kg), SpO2 94 %. Physical Exam  Constitutional: She is oriented to person, place, and time. No distress.  Cachectic, minimal jaundice  HENT:  Head: Normocephalic and atraumatic.  Eyes: EOM are normal. No scleral icterus.  Neck: Normal range of motion. Neck supple. No JVD present. No thyromegaly present.  Cardiovascular: Normal rate and regular rhythm.  Respiratory: Effort normal. No stridor.  GI: She exhibits distension. There is no tenderness.  Significant distension, likely ascites  Genitourinary:  Genitourinary Comments: Significant complete vaginal prolapse of vaginal cuff, enterocele, rectocele and cystocele. Erosions to vaginal mucosa.   Musculoskeletal: Normal range of motion.  Neurological: She is alert and oriented to person, place, and time.  Skin: Skin is warm and dry. There is erythema.  Patient has been on antibiotics of a cellulitis in the left lower extremity. Small 1cm erosion of skin which patient says is new. Generalized erythema of LLE that patient reports is improving.  Psychiatric: She has a normal mood and affect. Her behavior is normal. Judgment and thought content normal.    Assessment/Plan: 76yo with vaginal cuff prolapse, cystocele, rectocele, enterocele.  Would recommend treating vaginal mucosa with estrogen cream because of erosions. Wrap prolapse daily with Vaseline gauze.  If patient is unable to void independently would recommend maintaining foley until ascites is relieved. Patient may need to learn how to straight cath at home, urine output is likely minimal at this point because of third spacing of fluid. Spoke with admitting physician last night by phone. [place] planned for today to evaluate for ascites and possible TAP. Patient was started on IV fluids for  hydration. Would recommend transfer to [place] so patient can receive care with GI and transplant team. Discussed with family that patient's cirrhosis and fluid status is concerning. They understand that without treatment of cirrhosis or a liver transplant many of these issues will not improve. They are open to discussions about palliative care if this is what is recommended by GI and [facility] Transplant Team.    Homero Fellers MD [facility], [place]. 11/18/2017, 7:06 AM

## 2017-11-18 NOTE — Progress Notes (Signed)
[facility] - [place] [facility]   PATIENT NAME: Susan Greer    MR#:  [id number]  DATE OF BIRTH:  Aug 21, 1941  SUBJECTIVE:  CHIEF COMPLAINT: Patient reports shortness of breath with exertion and feeling tight in her abdomen.  Seen by [facility] liver specialist 3-4 times in the past but not in the transplant list, patient was seen by gynecologist at [facility] surgery was not performed as patient is not a surgical candidate  REVIEW OF SYSTEMS:  CONSTITUTIONAL: No fever,  Has fatigue or weakness.  EYES: No blurred or double vision.  EARS, NOSE, AND THROAT: No tinnitus or ear pain.  RESPIRATORY: No cough, shortness of breath, wheezing or hemoptysis.  CARDIOVASCULAR: No chest pain, orthopnea, edema.  GASTROINTESTINAL: No nausea, vomiting, reports abdominal distention GENITOURINARY: No dysuria, hematuria.  ENDOCRINE: No polyuria, nocturia,  HEMATOLOGY: No anemia, easy bruising or bleeding SKIN: No rash or lesion. MUSCULOSKELETAL: No joint pain or arthritis.   NEUROLOGIC: No tingling, numbness, weakness.  PSYCHIATRY: No anxiety or depression.   DRUG ALLERGIES:  No Known Allergies  VITALS:  Blood pressure (!) 96/46, pulse 72, temperature 98 F (36.7 C), temperature source Oral, resp. rate 16, height 5\' 3"  (1.6 m), weight 55 kg (121 lb 4.1 oz), SpO2 99 %.  PHYSICAL EXAMINATION:  GENERAL:  77 y.o.-year-old patient lying in the bed with no acute distress.  EYES: Pupils equal, round, reactive to light and accommodation. No scleral icterus. Extraocular muscles intact.  HEENT: Head atraumatic, normocephalic. Oropharynx and nasopharynx clear.  NECK:  Supple, no jugular venous distention. No thyroid enlargement, no tenderness.  LUNGS: Normal breath sounds bilaterally, no wheezing, rales,rhonchi or crepitation. No use of accessory muscles of respiration.  CARDIOVASCULAR: S1, S2 normal. No murmurs, rubs, or gallops.  ABDOMEN: Soft, nontender, distended with massive ascites. Bowel  sounds present.EXTREMITIES: No pedal edema, cyanosis, or clubbing.  NEUROLOGIC: Cranial nerves II through XII are intact. Muscle strength generalized weakness in all extremities. Sensation intact. Gait not checked.  PSYCHIATRIC: The patient is alert and oriented x 3.  SKIN: No obvious rash, lesion, or ulcer.    LABORATORY PANEL:   CBC Recent Labs  Lab 11/18/17 0614  WBC 8.7  HGB 11.9*  HCT 35.4  PLT 130*   ------------------------------------------------------------------------------------------------------------------  Chemistries  Recent Labs  Lab 11/17/17 1710 11/18/17 0614  NA 136 135  K 4.9 4.5  CL 103 107  CO2 24 22  GLUCOSE 138* 102*  BUN 85* 85*  CREATININE 4.41* 4.31*  CALCIUM 8.3* 8.0*  AST 43*  --   ALT 20  --   ALKPHOS 174*  --   BILITOT 1.7*  --    ------------------------------------------------------------------------------------------------------------------  Cardiac Enzymes No results for input(s): TROPONINI in the last 168 hours. ------------------------------------------------------------------------------------------------------------------  RADIOLOGY:  US Renal  Result Date: 11/18/2017 CLINICAL DATA:  Acute renal failure EXAM: RENAL / URINARY TRACT ULTRASOUND COMPLETE COMPARISON:  Abdominal MRI dated January 01, 2017 FINDINGS: Right Kidney: Length: 10.5 cm. The cortical echotexture is increased diffusely and appears greater than that of the liver. There is a lower pole cyst which measures 1 cm in greatest dimension. There is no hydronephrosis. Left Kidney: Length: 10.8 cm. The renal cortical echotexture is mildly increased but not as great low flea is that on the right. There is no hydronephrosis. Bladder: The urinary bladder is decompressed by the Foley catheter. IMPRESSION: Increased renal cortical echotexture greatest on the right compatible with medical renal disease. There is no hydronephrosis. The urinary bladder was empty due  to the presence of  a Foley catheter. Electronically Signed   By: David  [place]il[place] M.D.   On: 11/18/2017 12:57   US Abdomen Limited  Result Date: 11/18/2017 CLINICAL DATA:  Hepatic cirrhosis, ascites. EXAM: LIMITED ABDOMEN ULTRASOUND FOR ASCITES TECHNIQUE: Limited ultrasound survey for ascites was performed in all four abdominal quadrants. COMPARISON:  No recent study in PACs FINDINGS: A large amount of ascites is observed throughout the abdomen. The liver where visualized appears shrunken. Bowel loops are floating in the ascites. IMPRESSION: Large amount of ascites throughout the abdominal cavity. Electronically Signed   By: David  [place] M.D.   On: 11/18/2017 08:50   US Paracentesis  Result Date: 11/18/2017 INDICATION: Ascites EXAM: ULTRASOUND GUIDED DIAGNOSTIC AND THERAPEUTIC PARACENTESIS MEDICATIONS: None. COMPLICATIONS: None immediate. PROCEDURE: Informed written consent was obtained from the patient after a discussion of the risks, benefits and alternatives to treatment. A timeout was performed prior to the initiation of the procedure. Initial ultrasound scanning demonstrates a large amount of ascites within the right lower abdominal quadrant. The right lower abdomen was prepped and draped in the usual sterile fashion. 1% lidocaine with epinephrine was used for local anesthesia. Following this, a 6 Fr Safe-T-Centesis catheter was introduced. An ultrasound image was saved for documentation purposes. The paracentesis was performed. The catheter was removed and a dressing was applied. The patient tolerated the procedure well without immediate post procedural complication. FINDINGS: A total of approximately 5 L of clear yellow fluid was removed. Samples were sent to the laboratory as requested by the clinical team. IMPRESSION: Successful ultrasound-guided paracentesis yielding 5 liters of peritoneal fluid. Electronically Signed   By: Alcide Clever M.D.   On: 11/18/2017 12:30    EKG:   Orders placed or performed in visit on  07/23/17  . EKG 12-Lead    ASSESSMENT AND PLAN:    * Acute kidney injury due to urethral obstruction from uterine prolapse/possible hepatorenal syndrome  Foley catheter.    IV fluids.    nephrology following, discussed with Dr. Wynelle Link   *End-stage liver disease with massive ascites Patient is symptomatic with shortness of breath with minimal exertion Therapeutic paracentesis ultrasound-guided ordered  following with [facility] hepatologist as an outpatient not on transplantation list, according to them patient is not a surgical candidate  * Prolapsed uterus.  This is causing obstruction in turn causing acute kidney injury.  Discussed with Dr. Gaynelle Arabian of gynecology who will see the patient. Recommending to wrap prolapse daily with Vaseline Gauge, maintaining Foley Patient has been seen at [facility] gynecology in the past and not thought to be a surgical candidate due to cirrhosis GYN is recommending to transfer the patient to [facility] and [facility] transplant team if they are seeking aggressive measures including surgery.   *History of SVT.  Continue Toprol-XL.    DVT prophylaxis with heparin subcu     All the records are reviewed and case discussed with Care Management/Social Workerr. Management plans discussed with the patient, son and daughter-in-law at bedside I had a lengthy discussion with them at bedside.  Patient's son will discuss with  his brother and  would like to talk to palliative care and gastroenterology here.  Not considering transferring to [facility] at this time  CODE STATUS: fc   TOTAL TIME TAKING CARE OF THIS PATIENT: 35  minutes.   POSSIBLE D/C IN 2  DAYS, DEPENDING ON CLINICAL CONDITION.  Note: This dictation was prepared with Dragon dictation along with smaller phrase technology. Any transcriptional errors that result  from this process are unintentional.   Ramonita LabAruna Lee Kuang M.D on 11/18/2017 at 4:16 PM  Between 7am to 6pm - Pager - [phone] After 6pm go to  [email or web address] - password [email or web address] [facility]  [facility]  Office  [phone]  CC: Primary care physician; Allegra GranaArnett, Margaret G, FNP

## 2017-11-18 NOTE — Progress Notes (Signed)
Family Meeting Note  Advance Directive:yes  Today a meeting took place with the Patient, son and daughter-in-law at bedside     The following clinical team members were present during this meeting:MD  The following were discussed:Patient's diagnosis: Massive ascites from liver cirrhosis, acute kidney injury , prolapsed uterus, not a surgical candidate, following with [facility] hepatologist as an outpatient with poor prognosis patient's progosis: Poor and Goals for treatment: Full Code, 2 sons are healthcare power of attorney, agreeable to discuss with palliative care, gastroenterology  Additional follow-up to be provided: MD, palliative care, gastroenterology and nephrology  Time spent during discussion:20 minutes  Susan LabAruna Hovanes Hymas, MD

## 2017-11-18 NOTE — Consult Note (Signed)
Susan Miniumarren Mariajose Mow, MD [facility]  [address] [place], [place] [address] Phone: [phone] Fax : [phone]  Consultation  Referring Provider:     Dr. Amado CoeGouru Primary Care Physician:  Allegra GranaArnett, Margaret G, FNP Primary Gastroenterologist:  Dr. Mechele CollinElliott         Reason for Consultation:     ascites  Date of Admission:  11/17/2017 Date of Consultation:  11/18/2017         HPI:   Susan Greer is a 77 y.o. female who has a history of cirrhosis and [facility]. The patient is accompanied by her family today. They report that no cause for her liver disease was ever found. The patient has never had a liver  Biopsy. The family states that the patient's ascites started about 5 months ago and has progressively gotten worse. The patient had 5 L of fluid taken off today.  The patient was also given albumin after the paracentesis.  The patient reports that she had been on Aldactone and Lasix for her ascites.  There is no report of any abdominal pain after having the paracentesis.  The patient's white cell count of the ascitic fluid was 233 with only 29% of them being neutrophils.  The patient's albumin in the fluid was less than 1.  The patient's creatinine has also been elevated since admission with a creatinine of 4.4 yesterday and is 4.3 today.  I am now being asked to see the patient for her ascites.  Past Medical History:  Diagnosis Date  . GERD (gastroesophageal reflux disease)   . Hypertension   . Kidney stones   . SVT (supraventricular tachycardia) (HCC)    [facility]ath [facility], s/p adenosine  . UTI (urinary tract infection)     Past Surgical History:  Procedure Laterality Date  . ABDOMINAL HYSTERECTOMY     Dr. Weston AnnaEllington, for fibroid tumor and endometriosis  . ESOPHAGOGASTRODUODENOSCOPY (EGD) WITH PROPOFOL N/A 01/27/2017   Procedure: ESOPHAGOGASTRODUODENOSCOPY (EGD) WITH PROPOFOL;  Surgeon: Scot Junobert T Elliott, MD;  Location[facility] ENDOSCOPY;  Service:  Endoscopy;  Laterality: N/A;    Prior to Admission medications   Medication Sig Start Date End Date Taking? Authorizing Provider  Cholecalciferol (VITAMIN D3) 1000 units CAPS Take by mouth.    [provider]  clorazepate (TRANXENE) 7.5 MG tablet Take 7.5 mg by mouth 2 (two) times daily as needed for anxiety.    [provider]  doxycycline (VIBRAMYCIN) 100 MG capsule Take 1 capsule (100 mg total) by mouth 2 (two) times daily. Patient not taking: Reported on 08/27/2017 07/23/17   Myrna BlazerSchaevitz, David Matthew, MD  furosemide (LASIX) 40 MG tablet Take by mouth. 08/01/17 08/01/18  [provider]  hydrocortisone (ANUSOL-HC) 2.5 % rectal cream Place 1 application rectally 2 (two) times daily. Patient not taking: Reported on 08/27/2017 07/08/17   Allegra GranaArnett, Margaret G, FNP  hydrocortisone (ANUSOL-HC) 25 MG suppository Place 1 suppository (25 mg total) rectally 2 (two) times daily. Patient not taking: Reported on 08/27/2017 07/08/17   Allegra GranaArnett, Margaret G, FNP  metoprolol (TOPROL-XL) 200 MG 24 hr tablet Take 1 tablet (200 mg total) by mouth daily. 10/13/17   Allegra GranaArnett, Margaret G, FNP  Multiple Vitamins-Minerals (PRESERVISION AREDS 2+MULTI VIT) CAPS Take 1 capsule by mouth 2 (two) times daily.    [provider]  omeprazole (PRILOSEC) 40 MG capsule Take by mouth. 01/28/17 01/28/18  [provider]  potassium chloride (K-DUR) 10 MEQ tablet Take 1 tablet (10 mEq  total) by mouth daily. 07/29/17   Allegra Grana, FNP  sertraline (ZOLOFT) 50 MG tablet Take by mouth.    [provider]  spironolactone (ALDACTONE) 25 MG tablet Take 1 tablet (25 mg total) by mouth daily. 06/25/17   Allegra Grana, FNP  vancomycin (VANCOCIN) 125 MG capsule Take 1 capsule (125 mg total) by mouth 4 (four) times daily. Patient not taking: Reported on 08/27/2017 07/11/17   Allegra Grana, FNP    Family History  Problem Relation Age of Onset  . Hypertension Mother   . Cancer Mother 55         breast  . Hypertension Father   . Pneumonia Father   . Diabetes Brother   . Hypertension Brother   . Cancer Sister 64       breast     Social History   Tobacco Use  . Smoking status: Never Smoker  . Smokeless tobacco: Never Used  Substance Use Topics  . Alcohol use: No  . Drug use: No    Allergies as of 11/17/2017  . (No Known Allergies)    Review of Systems:    All systems reviewed and negative except where noted in HPI.   Physical Exam:  Vital signs in last 24 hours: Temp:  [97.7 F (36.5 C)-98 F (36.7 C)] 98 F (36.7 C) (01/29 0416) Pulse Rate:  [60-73] 72 (01/29 1258) Resp:  [16-20] 16 (01/29 1258) BP: (96-136)/(46-72) 96/46 (01/29 1258) SpO2:  [94 %-99 %] 99 % (01/29 1258) Weight:  [121 lb 4.1 oz (55 kg)-140 lb 12.8 oz (63.9 kg)] 121 lb 4.1 oz (55 kg) (01/29 0416) Last BM Date: 11/18/17 General:   Pleasant, cooperative in NAD Head:  Normocephalic and atraumatic. Eyes:   No icterus.   Conjunctiva pink. PERRLA. Ears:  Normal auditory acuity. Neck:  Supple; no masses or thyroidomegaly Lungs: Respirations even and unlabored. Lungs clear to auscultation bilaterally.   No wheezes, crackles, or rhonchi.  Heart:  Regular rate and rhythm;  Without murmur, clicks, rubs or gallops Abdomen:  Soft, nondistended, nontender. Normal bowel sounds. No appreciable masses or hepatomegaly.  No rebound or guarding.  Rectal:  Not performed. Msk:  Symmetrical without gross deformities.    Extremities:  Without edema, cyanosis or clubbing. Neurologic:  Alert and oriented x3;  grossly normal neurologically. Skin:  Intact without significant lesions or rashes. Cervical Nodes:  No significant cervical adenopathy. Psych:  Alert and cooperative. Normal affect.  LAB RESULTS: Recent Labs    11/17/17 1710 11/18/17 0614  WBC 9.9 8.7  HGB 13.0 11.9*  HCT 38.1 35.4  PLT 141* 130*   BMET Recent Labs    11/17/17 1710 11/18/17 0614  NA 136 135  K 4.9 4.5  CL 103 107  CO2  24 22  GLUCOSE 138* 102*  BUN 85* 85*  CREATININE 4.41* 4.31*  CALCIUM 8.3* 8.0*   LFT Recent Labs    11/17/17 1710  PROT 7.3  ALBUMIN 2.1*  AST 43*  ALT 20  ALKPHOS 174*  BILITOT 1.7*   PT/INR No results for input(s): LABPROT, INR in the last 72 hours.  STUDIES: US Renal  Result Date: 11/18/2017 CLINICAL DATA:  Acute renal failure EXAM: RENAL / URINARY TRACT ULTRASOUND COMPLETE COMPARISON:  Abdominal MRI dated January 01, 2017 FINDINGS: Right Kidney: Length: 10.5 cm. The cortical echotexture is increased diffusely and appears greater than that of the liver. There is a lower pole cyst which measures 1 cm in greatest dimension. There  is no hydronephrosis. Left Kidney: Length: 10.8 cm. The renal cortical echotexture is mildly increased but not as great low flea is that on the right. There is no hydronephrosis. Bladder: The urinary bladder is decompressed by the Foley catheter. IMPRESSION: Increased renal cortical echotexture greatest on the right compatible with medical renal disease. There is no hydronephrosis. The urinary bladder was empty due to the presence of a Foley catheter. Electronically Signed   By: David  [place]az[place]and M.D.   On: 11/18/2017 12:57   US Abdomen Limited  Result Date: 11/18/2017 CLINICAL DATA:  Hepatic cirrhosis, ascites. EXAM: LIMITED ABDOMEN ULTRASOUND FOR ASCITES TECHNIQUE: Limited ultrasound survey for ascites was performed in all four abdominal quadrants. COMPARISON:  No recent study in PACs FINDINGS: A large amount of ascites is observed throughout the abdomen. The liver where visualized appears shrunken. Bowel loops are floating in the ascites. IMPRESSION: Large amount of ascites throughout the abdominal cavity. Electronically Signed   By: David  [place]az[place] M.D.   On: 11/18/2017 08:50   US Paracentesis  Result Date: 11/18/2017 INDICATION: Ascites EXAM: ULTRASOUND GUIDED DIAGNOSTIC AND THERAPEUTIC PARACENTESIS MEDICATIONS: None. COMPLICATIONS: None immediate. PROCEDURE:  Informed written consent was obtained from the patient after a discussion of the risks, benefits and alternatives to treatment. A timeout was performed prior to the initiation of the procedure. Initial ultrasound scanning demonstrates a large amount of ascites within the right lower abdominal quadrant. The right lower abdomen was prepped and draped in the usual sterile fashion. 1% lidocaine with epinephrine was used for local anesthesia. Following this, a 6 Fr Safe-T-Centesis catheter was introduced. An ultrasound image was saved for documentation purposes. The paracentesis was performed. The catheter was removed and a dressing was applied. The patient tolerated the procedure well without immediate post procedural complication. FINDINGS: A total of approximately 5 L of clear yellow fluid was removed. Samples were sent to the laboratory as requested by the clinical team. IMPRESSION: Successful ultrasound-guided paracentesis yielding 5 liters of peritoneal fluid. Electronically Signed   By: Alcide Clever M.D.   On: 11/18/2017 12:30      Impression / Plan:   LOUANNA VANLIEW is a 77 y.o. 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  The patient has had a paracentesis and has been found to have elevated creatinine.  The patient has been told to avoid iron supplementation or cooking with a iron skillet.  The patient has also been told to decrease the amount of red meat she eats.  As for her ascites it appears to be significantly decreased after the paracentesis.  The patient should have her creatinine followed with such a large fluid shift which can decrease her intravascular volume thereby causing prerenal azotemia.  The addition of albumin may mitigate this.  If the creatinine comes back to normal and the patient is discharged, her diuretics should be very slowly added with avoidance of causing any bump in her creatinine.  The patient and the family have been explained the plan and agree with it.  Thank you  for involving me in the care of this patient.      LOS: 1 day   Susan Minium, MD  11/18/2017, 3:58 PM   Note: This dictation was prepared with Dragon dictation along with smaller phrase technology. Any transcriptional errors that result from this process are unintentional.

## 2017-11-18 NOTE — Progress Notes (Signed)
Chart reviewed and discussed with Dr. Amado CoeGouru and Dr. Wynelle LinkKolluru. Upon arrival to room, per RN patient off the floor for temp HD cath placement. No family at bedside. Palliative NP will see tomorrow for initial goals of care.   NO CHARGE  Vennie HomansMegan Marelly Wehrman, FNP-C Palliative Medicine Team  Phone: [phone] Fax: [phone]

## 2017-11-19 DIAGNOSIS — Z7189 Other specified counseling: Secondary | ICD-10-CM

## 2017-11-19 DIAGNOSIS — N179 Acute kidney failure, unspecified: Secondary | ICD-10-CM

## 2017-11-19 DIAGNOSIS — K7460 Unspecified cirrhosis of liver: Secondary | ICD-10-CM

## 2017-11-19 DIAGNOSIS — Z515 Encounter for palliative care: Secondary | ICD-10-CM

## 2017-11-19 LAB — COMPREHENSIVE METABOLIC PANEL
ALBUMIN: 2.9 g/dL — AB (ref 3.5–5.0)
ALT: 14 U/L (ref 14–54)
ANION GAP: 8 (ref 5–15)
AST: 30 U/L (ref 15–41)
Alkaline Phosphatase: 135 U/L — ABNORMAL HIGH (ref 38–126)
BUN: 85 mg/dL — AB (ref 6–20)
CHLORIDE: 106 mmol/L (ref 101–111)
CO2: 21 mmol/L — AB (ref 22–32)
Calcium: 8.3 mg/dL — ABNORMAL LOW (ref 8.9–10.3)
Creatinine, Ser: 4.07 mg/dL — ABNORMAL HIGH (ref 0.44–1.00)
GFR calc Af Amer: 11 mL/min — ABNORMAL LOW (ref >60)
GFR calc non Af Amer: 10 mL/min — ABNORMAL LOW (ref >60)
GLUCOSE: 106 mg/dL — AB (ref 65–99)
POTASSIUM: 4.4 mmol/L (ref 3.5–5.1)
Sodium: 135 mmol/L (ref 135–145)
Total Bilirubin: 2 mg/dL — ABNORMAL HIGH (ref 0.3–1.2)
Total Protein: 6.2 g/dL — ABNORMAL LOW (ref 6.5–8.1)

## 2017-11-19 LAB — CBC
HEMATOCRIT: 31.9 % — AB (ref 35.0–47.0)
Hemoglobin: 10.9 g/dL — ABNORMAL LOW (ref 12.0–16.0)
MCH: 34.4 pg — ABNORMAL HIGH (ref 26.0–34.0)
MCHC: 34.2 g/dL (ref 32.0–36.0)
MCV: 100.6 fL — ABNORMAL HIGH (ref 80.0–100.0)
PLATELETS: 79 10*3/uL — AB (ref 150–440)
RBC: 3.17 MIL/uL — ABNORMAL LOW (ref 3.80–5.20)
RDW: 16.3 % — AB (ref 11.5–14.5)
WBC: 6.9 10*3/uL (ref 3.6–11.0)

## 2017-11-19 LAB — PATHOLOGIST SMEAR REVIEW

## 2017-11-19 NOTE — Progress Notes (Signed)
PT Cancellation Note  Patient Details Name: Tamera PuntJudy I Corniel MRN: [id number] DOB: 1941/08/25   Cancelled Treatment:    Reason Eval/Treat Not Completed: Other (comment). PT screen performed. Pt reports increased pain from uterine prolapse. Per patient, she has no difficulty walking (no AD, no falls) when she is pain free, has been ambulatory with RN staff for limited distances. Reports she feels at baseline level other than pain from prolapse, limiting mobility. Does not require formalized PT services at this time. Continue mobility efforts with RN for supervision. Please re-consult if needed.   Kedarius Aloisi 11/19/2017, 3:02 PM Elizabeth PalauStephanie Blayne Garlick, PT, DPT [phone]

## 2017-11-19 NOTE — Progress Notes (Signed)
[facility]  ROUNDING NOTE   Subjective:   Family at bedside. Palliative care meeting with family.   UOP inaccurate.   Creatinine 4.07 (4.31)  Paracentesis yesterday with 5 liters removed.   Objective:  Vital signs in last 24 hours:  Temp:  [97.7 F (36.5 C)-98.2 F (36.8 C)] 97.9 F (36.6 C) (01/30 1333) Pulse Rate:  [58-75] 66 (01/30 1333) Resp:  [12-13] 12 (01/30 1333) BP: (95-108)/(40-62) 101/40 (01/30 1333) SpO2:  [97 %-98 %] 98 % (01/30 1333) Weight:  [53.9 kg (118 lb 14.4 oz)] 53.9 kg (118 lb 14.4 oz) (01/30 0352)  Weight change: -9.934 kg (-14.4 oz) Filed Weights   11/17/17 1633 11/18/17 0416 11/19/17 0352  Weight: 63.9 kg (140 lb 12.8 oz) 55 kg (121 lb 4.1 oz) 53.9 kg (118 lb 14.4 oz)    Intake/Output: I/O last 3 completed shifts: In: 861 [P.O.:240; I.V.:450; IV Piggyback:171] Out: 90 [Urine:90]   Intake/Output this shift:  Total I/O In: 331 [P.O.:240; IV Piggyback:91] Out: -   Physical Exam: General: NAD, laying in bed  Head: Normocephalic, atraumatic. Moist oral mucosal membranes  Eyes: Anicteric, PERRL  Neck: Supple, trachea midline  Lungs:  Clear to auscultation  Heart: Regular rate and rhythm  Abdomen:  +ascites  Extremities: + peripheral edema.  Neurologic: Nonfocal, moving all four extremities  Skin: No lesions        Basic Metabolic Panel: Recent Labs  Lab 11/17/17 1710 11/18/17 0614 11/19/17 0730  NA 136 135 135  K 4.9 4.5 4.4  CL 103 107 106  CO2 24 22 21*  GLUCOSE 138* 102* 106*  BUN 85* 85* 85*  CREATININE 4.41* 4.31* 4.07*  CALCIUM 8.3* 8.0* 8.3*    Liver Function Tests: Recent Labs  Lab 11/17/17 1710 11/19/17 0730  AST 43* 30  ALT 20 14  ALKPHOS 174* 135*  BILITOT 1.7* 2.0*  PROT 7.3 6.2*  ALBUMIN 2.1* 2.9*   No results for input(s): LIPASE, AMYLASE in the last 168 hours. No results for input(s): AMMONIA in the last 168 hours.  CBC: Recent Labs  Lab 11/17/17 1710 11/18/17 0614 11/19/17 0730   WBC 9.9 8.7 6.9  NEUTROABS 7.3*  --   --   HGB 13.0 11.9* 10.9*  HCT 38.1 35.4 31.9*  MCV 100.7* 100.8* 100.6*  PLT 141* 130* 79*    Cardiac Enzymes: No results for input(s): CKTOTAL, CKMB, CKMBINDEX, TROPONINI in the last 168 hours.  BNP: Invalid input(s): POCBNP  CBG: No results for input(s): GLUCAP in the last 168 hours.  Microbiology: Results for orders placed or performed during the hospital encounter of 11/17/17  Body fluid culture     Status: None (Preliminary result)   Collection Time: 11/18/17 12:00 PM  Result Value Ref Range Status   Specimen Description   Final    PERITONEAL Performed at [facility], [address] [place], [place] [address]    Special Requests   Final    NONE Performed at [facility], [address] [place], [place] [address]    Gram Stain NO WBC SEEN NO ORGANISMS SEEN   Final   Culture   Final    NO GROWTH < 24 HOURS Performed at [facility], [address] [place], [place] [address]    Report Status PENDING  Incomplete    Coagulation Studies: No results for input(s): LABPROT, INR in the last 72 hours.  Urinalysis: Recent Labs    11/17/17 1656  COLORURINE YELLOW*  LABSPEC  1.012  PHURINE 5.0  GLUCOSEU NEGATIVE  HGBUR LARGE*  BILIRUBINUR NEGATIVE  KETONESUR NEGATIVE  PROTEINUR 30*  NITRITE NEGATIVE  LEUKOCYTESUR LARGE*      Imaging: [place] Renal  Result Date: 11/18/2017 CLINICAL DATA:  Acute renal failure EXAM: RENAL / URINARY TRACT ULTRASOUND COMPLETE COMPARISON:  Abdominal MRI dated January 01, 2017 FINDINGS: Right Kidney: Length: 10.5 cm. The cortical echotexture is increased diffusely and appears greater than that of the liver. There is a lower pole cyst which measures 1 cm in greatest dimension. There is no hydronephrosis. Left Kidney: Length: 10.8 cm. The renal cortical echotexture is mildly increased but not as great low flea is that on the right. There is no hydronephrosis. Bladder: The  urinary bladder is decompressed by the Foley catheter. IMPRESSION: Increased renal cortical echotexture greatest on the right compatible with medical renal disease. There is no hydronephrosis. The urinary bladder was empty due to the presence of a Foley catheter. Electronically Signed   By: David  SwazilandJordan M.D.   On: 11/18/2017 12:57   [place] Abdomen Limited  Result Date: 11/18/2017 CLINICAL DATA:  Hepatic cirrhosis, ascites. EXAM: LIMITED ABDOMEN ULTRASOUND FOR ASCITES TECHNIQUE: Limited ultrasound survey for ascites was performed in all four abdominal quadrants. COMPARISON:  No recent study in PACs FINDINGS: A large amount of ascites is observed throughout the abdomen. The liver where visualized appears shrunken. Bowel loops are floating in the ascites. IMPRESSION: Large amount of ascites throughout the abdominal cavity. Electronically Signed   By: David  [place]azilandJordan M.D.   On: 11/18/2017 08:50   [place] Paracentesis  Result Date: 11/18/2017 INDICATION: Ascites EXAM: ULTRASOUND GUIDED DIAGNOSTIC AND THERAPEUTIC PARACENTESIS MEDICATIONS: None. COMPLICATIONS: None immediate. PROCEDURE: Informed written consent was obtained from the patient after a discussion of the risks, benefits and alternatives to treatment. A timeout was performed prior to the initiation of the procedure. Initial ultrasound scanning demonstrates a large amount of ascites within the right lower abdominal quadrant. The right lower abdomen was prepped and draped in the usual sterile fashion. 1% lidocaine with epinephrine was used for local anesthesia. Following this, a 6 Fr Safe-T-Centesis catheter was introduced. An ultrasound image was saved for documentation purposes. The paracentesis was performed. The catheter was removed and a dressing was applied. The patient tolerated the procedure well without immediate post procedural complication. FINDINGS: A total of approximately 5 L of clear yellow fluid was removed. Samples were sent to the laboratory as  requested by the clinical team. IMPRESSION: Successful ultrasound-guided paracentesis yielding 5 liters of peritoneal fluid. Electronically Signed   By: Alcide CleverMark  Lukens M.D.   On: 11/18/2017 12:30     Medications:   . albumin human Stopped (11/19/17 1515)   . conjugated estrogens  1 Applicatorful Vaginal Daily  . docusate  50 mg Oral BID  . metoprolol  200 mg Oral Daily  . pantoprazole  40 mg Oral Daily  . pneumococcal 23 valent vaccine  0.5 mL Intramuscular Tomorrow-1000  . sertraline  50 mg Oral QHS   acetaminophen **OR** acetaminophen, albuterol, clorazepate, HYDROcodone-acetaminophen, morphine injection, ondansetron **OR** ondansetron (ZOFRAN) IV, polyethylene glycol  Assessment/ Plan:  [name] is a 77 y.o. white female with hypertension, liver cirrhosis possibly secondary to fatty liver disease, SVT, history of uterine fibroids, depression, and vaginal prolapse   1. Acute renal failure: baseline creatinine of 1.04 with GFR of 49 on 07/29/2017.  Acute renal failure secondary to either over diuresis versus acute hepatorenal syndrome - failed outpatient diuretic therapy - Watch for  improvement status post large volume paracentesis. 5 liters removed on 1/29.  - low threshold for dialysis. No acute indication for dialysis.  - Appreciate palliative care input.    2. Anasarca/ascites: patient with volume overload and hypoalbuminemia - paracentesis 1/29: removed 5 liters.  - Continue IV albumin   LOS: 2 Christi Wirick 1/30/20194:13 PM

## 2017-11-19 NOTE — Progress Notes (Addendum)
Palliative meeting with patient and multiple family members at bedside including two sons. Patient was very nauseous during our conversation and not feeling well to participate with goals of care. After discussion, continue FULL code/FULL scope for now. Patient/family will be continuing conversations regarding GOC. Watchful waiting to see if kidney functions improve. She seems hesitant to begin dialysis if necessary.   Full note to follow.   NO CHARGE  Vennie HomansMegan Anah Billard, FNP-C Palliative Medicine Team  Phone: [phone] Fax: [phone]

## 2017-11-19 NOTE — Care Management Note (Signed)
Case Management Note  Patient Details  Name: Susan Greer MRN: [id number] Date of Birth: 09-17-1941  Subjective/Objective:  Admitted to [facility] with the diagnosis of acute kidney injury. Lives alone. Son is Alinda Moneyony [phone]). Last seen Dr. Yehuda SavannahArnette in September 2018. [facility], No home health. No skilled facility. No home oxygen. The only medical equipment in the home is grab bars. Takes care of all basic activities of living herself, limited driving. No falls. Good appetite.                   Action/Plan: Hx liver cirrhosis. Palliative care consult pending.   Expected Discharge Date:                  Expected Discharge Plan:     In-House Referral:   yes  Discharge planning Services   yes  Post Acute Care Choice:    Choice offered to:     DME Arranged:    DME Agency:     HH Arranged:    HH Agency:     Status of Service:     If discussed at [employer] Length of Stay Meetings, dates discussed:    Additional Comments:  Gwenette GreetBrenda S Lecretia Buczek, RN MSN CCM Care Management [phone] 11/19/2017, 8:58 AM

## 2017-11-19 NOTE — Plan of Care (Signed)
VSS, free of falls during shift.  Family reported leaking of foley near luer lock port.  No leaking observed by this RN, pt aware to alert RN if leaking recurs.  Reported buttocks pain 5/10, received PRN PO Vicodin 5-325mg , asleep on reassessment.  No other complaints overnight.  Bed in low position, call bell within reach.  Family at bedside.  WCTM.

## 2017-11-19 NOTE — Care Management Important Message (Signed)
Important Message  Patient Details  Name: Susan PuntJudy I Greer MRN: [id number] Date of Birth: 30-Jul-1941   Medicare Important Message Given:  Yes    Gwenette GreetBrenda S Maximilien Hayashi, RN 11/19/2017, 6:55 AM

## 2017-11-19 NOTE — Consult Note (Signed)
Consultation Note Date: 11/19/2017   Patient Name: Susan Greer  DOB: [birth year]  MRN: [id number]  Age / Sex: 77 y.o., female  PCP: Burnard Hawthorne, FNP Referring Physician: Nicholes Mango, MD  Reason for Consultation: Establishing goals of care  HPI/Patient Profile: 77 y.o. female  with past medical history of liver cirrhosis, uterine/bladder prolapse, HTN, UTI, SVT, GERD admitted on 11/17/2017 from nephrology office with elevated BUN and creatinine. Patient has been followed by Dr. Angela Adam with [facility] transplant team. Also followed by GI. No history of ETOH abuse or hepatitis. Patient is not a candidate for liver transplant. Presented with ascites s/p paracentesis 1/29 with 5L removed. Nephrology consulted on admission. Low threshold for hemodialysis. BUN/Creat on 1/30 are 85/4.07. Palliative medicine consultation for goals of care.   Clinical Assessment and Goals of Care: I have reviewed medical records, discussed with care team and met with patient, two sons Nicole Kindred & Dewayne), daughter-in-law Gerald Stabs), and grandson at bedside to discuss diagnosis prognosis, [place], EOL wishes, disposition and options.  Patient is alert and oriented but very nauseous upon my arrival to room. RN gave prn zofran. Patient drowsy and minimally participates in [place] conversation during my visit. She gives her family permission to speak with me.   Introduced Palliative Medicine as specialized medical care for people living with serious illness. It focuses on providing relief from the symptoms and stress of a serious illness. The goal is to improve quality of life for both the patient and the family.  We discussed a brief life review of the patient. Prior to hospitalization, living home independently. No spouse. Two supportive sons and other family members. Multiple notes reviewed. Patient's LFT's became elevated in 2011. She is not on a  transplant list. She was recently told by Dr. Demetrio Lapping that she is high risk for prolapse surgery. Per family, patient's stomach has been more distended in the last few months but she has never required paracentesis.  Family speaks of their frustrations with not knowing "what stage" her cirrhosis is. She has gone "back and forth" to various doctors with [facility], [facility], and [facility].   Discussed hospital diagnoses and interventions. Discussed decompensated liver cirrhosis by increased BLE edema, ascites, and now with kidney involvement. Explained concern with recurrent ascites secondary to worsening liver. Also discussed guarded prognosis secondary to her not being a candidate for transplant and now with acute renal failure with low threshold for requiring dialysis.   Advanced directives, concepts specific to code status, and artifical feeding and hydration were discussed. The patient does not have a living will or documented HCPOA. Son states "we need to get that in place." I discussed big decisions they are faced with including resuscitation, life support, and dialysis. As previously stated, patient is nauseous and does not participate during majority of conversation. Educated family on medical recommendation against resuscitation secondary to age, decompensated liver cirrhosis, and poor chance of meaningful recovery (to previous independent state). Hard Choices copy left for review. Family would like to discuss with Ms. Baird when she  is feeling better.   At this point, the patient and family are hesitant with the thought of starting dialysis. Dewayne shares that Ms. Regis's brother was on dialysis for 11 years and had very poor quality of life at the end. We did discuss quality versus quality.   I frankly and compassionately shared with family that since she is not a transplant candidate and if she does not wish to pursue dialysis (if deemed necessary by nephrology), she would be eligible for hospice services  (six month or less prognosis) with focus on comfort and symptom management at EOL. They confirm understanding.   Answered many questions and concerns for family. I also was present during Dr. Assunta Gambles discussion with family. At this point, watchful waiting and hopeful for improvement in kidney function.   Emotional/spiritual support provided.    SUMMARY OF RECOMMENDATIONS    FULL code/ FULL scope  Watchful waiting. Patient/family hopeful for improvement in kidney function. They seem hesitant to begin dialysis if necessary.   Educated on importance of advanced directives and discussing EOL wishes. Hard Choices copy left.  Introduced Insurance underwriter.  PMT will continue to support patient/family through hospitalization.   Code Status/Advance Care Planning:  Full code-Educated on medical recommendation for DNR with age, liver cirrhosis, hepatorenal syndrome, and poor outcomes of CPR.   Symptom Management:   Per attending  Palliative Prophylaxis:   Bowel Regimen, Delirium Protocol and Frequent Pain Assessment  Additional Recommendations (Limitations, Scope, Preferences):  Full Scope Treatment  Psycho-social/Spiritual:   Desire for further Chaplaincy support:yes  Additional Recommendations: Caregiving  Support/Resources and Education on Hospice  Prognosis:   Guarded with decompensated liver cirrhosis not a transplant candidate, recurrent ascites, and acute renal failure secondary to hepatorenal syndrome  Discharge Planning: To Be Determined      Primary Diagnoses: Present on Admission: . AKI (acute kidney injury) ([place])   I have reviewed the medical record, interviewed the patient and family, and examined the patient. The following aspects are pertinent.  Past Medical History:  Diagnosis Date  . GERD (gastroesophageal reflux disease)   . Hypertension   . Kidney stones   . SVT (supraventricular tachycardia) (HCC)    Dr. Ubaldo Glassing at [place], s/p adenosine  . UTI  (urinary tract infection)    Social History   Socioeconomic History  . Marital status: Widowed    Spouse name: None  . Number of children: None  . Years of education: None  . Highest education level: None  Social Needs  . Financial resource strain: None  . Food insecurity - worry: None  . Food insecurity - inability: None  . Transportation needs - medical: None  . Transportation needs - non-medical: None  Occupational History  . None  Tobacco Use  . Smoking status: Never Smoker  . Smokeless tobacco: Never Used  Substance and Sexual Activity  . Alcohol use: No  . Drug use: No  . Sexual activity: None  Other Topics Concern  . None  Social History Narrative   Lives in [place]. Widow. 2 sons.      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       Diet - regular      Exercise - outside yardwork, lives on 3 acres and helps son with his property    Family History  Problem Relation Age of Onset  . Hypertension Mother   . Cancer Mother 899      breast  . Hypertension Father   .  Pneumonia Father   . Diabetes Brother   . Hypertension Brother   . Cancer Sister 30       breast   Scheduled Meds: . conjugated estrogens  1 Applicatorful Vaginal Daily  . docusate  50 mg Oral BID  . metoprolol  200 mg Oral Daily  . pantoprazole  40 mg Oral Daily  . pneumococcal 23 valent vaccine  0.5 mL Intramuscular Tomorrow-1000  . sertraline  50 mg Oral QHS   Continuous Infusions: . albumin human Stopped (11/19/17 1515)   PRN Meds:.acetaminophen **OR** acetaminophen, albuterol, clorazepate, HYDROcodone-acetaminophen, morphine injection, ondansetron **OR** ondansetron (ZOFRAN) IV, polyethylene glycol Medications Prior to Admission:  Prior to Admission medications   Medication Sig Start Date End Date Taking? Authorizing Provider  Cholecalciferol (VITAMIN D3) 1000 units CAPS Take 1 capsule by mouth daily.    Yes [provider]  doxycycline (VIBRA-TABS)  100 MG tablet Take 1 tablet by mouth 2 (two) times daily. 11/13/17 11/23/17 Yes [provider]  metoprolol (TOPROL-XL) 200 MG 24 hr tablet Take 1 tablet (200 mg total) by mouth daily. 10/13/17  Yes Arnett, Yvetta Coder, FNP  Multiple Vitamins-Minerals (PRESERVISION AREDS 2+MULTI VIT) CAPS Take 1 capsule by mouth 2 (two) times daily.   Yes [provider]  potassium chloride (K-DUR) 10 MEQ tablet Take 1 tablet (10 mEq total) by mouth daily. Patient taking differently: Take 10 mEq by mouth every other day.  07/29/17  Yes Arnett, Yvetta Coder, FNP  rifaximin (XIFAXAN) 550 MG TABS tablet Take 550 mg by mouth daily. 10/08/17  Yes [provider]  clorazepate (TRANXENE) 7.5 MG tablet Take 7.5 mg by mouth 2 (two) times daily as needed for anxiety.    [provider]  hydrocortisone (ANUSOL-HC) 2.5 % rectal cream Place 1 application rectally 2 (two) times daily. Patient not taking: Reported on 08/27/2017 07/08/17   Burnard Hawthorne, FNP  hydrocortisone (ANUSOL-HC) 25 MG suppository Place 1 suppository (25 mg total) rectally 2 (two) times daily. Patient not taking: Reported on 08/27/2017 07/08/17   Burnard Hawthorne, FNP  omeprazole (PRILOSEC) 40 MG capsule Take 40 mg by mouth daily.  01/28/17 01/28/18  [provider]  spironolactone (ALDACTONE) 25 MG tablet Take 1 tablet (25 mg total) by mouth daily. Patient not taking: Reported on 11/19/2017 06/25/17   Burnard Hawthorne, FNP  vancomycin (VANCOCIN) 125 MG capsule Take 1 capsule (125 mg total) by mouth 4 (four) times daily. Patient not taking: Reported on 08/27/2017 07/11/17   Burnard Hawthorne, FNP   No Known Allergies Review of Systems  Constitutional: Positive for activity change, appetite change and fatigue.  Cardiovascular: Positive for leg swelling.  Gastrointestinal: Positive for abdominal distention and nausea.  Neurological: Positive for weakness.   Physical Exam  Constitutional: She is oriented to person,  place, and time. She is easily aroused. She appears ill.  HENT:  Head: Normocephalic and atraumatic.  Pulmonary/Chest: Effort normal. No accessory muscle usage. No tachypnea. No respiratory distress.  Abdominal: She exhibits distension. There is no tenderness.  Musculoskeletal: She exhibits edema (BLE).  Neurological: She is alert, oriented to person, place, and time and easily aroused.  Skin: Skin is warm and dry. There is pallor.  Psychiatric: She has a normal mood and affect. Her speech is normal and behavior is normal.  Nursing note and vitals reviewed.  Vital Signs: BP (!) 115/55 (BP Location: Left Arm)   Pulse 70   Temp 98 F (36.7 C) (Oral)   Resp 13  Ht 5' 3"  (1.6 m)   Wt 53.9 kg (118 lb 14.4 oz)   SpO2 100%   BMI 21.06 kg/m  Pain Assessment: 0-10   Pain Score: 2 (pt states that pain is much improved)  SpO2: SpO2: 100 % O2 Device:SpO2: 100 % O2 Flow Rate: .   IO: Intake/output summary:   Intake/Output Summary (Last 24 hours) at 11/19/2017 2031 Last data filed at 11/19/2017 1300 Gross per 24 hour  Intake 402 ml  Output -  Net 402 ml    LBM: Last BM Date: 11/19/17 Baseline Weight: Weight: 63.9 kg (140 lb 12.8 oz) Most recent weight: Weight: 53.9 kg (118 lb 14.4 oz)     Palliative Assessment/Data: PPS 60%   Flowsheet Rows     Most Recent Value  Intake Tab  Referral Department  Hospitalist  Unit at Time of Referral  Med/Surg Unit  Palliative Care Primary Diagnosis  -- [liver cirrhosis, hepatorenal syndrome]  Date Notified  11/18/17  Palliative Care Type  New Palliative care  Reason for referral  Clarify Goals of Care  Date first seen by Palliative Care  11/19/17  # of days Palliative referral response time  1 Day(s)  Clinical Assessment  Psychosocial & Spiritual Assessment  Palliative Care Outcomes  Patient/Family meeting held?  Yes  Who was at the meeting?  patient, two sons, daughter-in-law, grandson  Palliative Care Outcomes  Clarified goals of care,  Counseled regarding hospice, Provided end of life care assistance, Provided psychosocial or spiritual support, ACP counseling assistance      Time In: 0950 Time Out: 1120 Time Total: 17mn Greater than 50%  of this time was spent counseling and coordinating care related to the above assessment and plan.  Signed by:  MIhor Dow FNP-C Palliative Medicine Team  Phone: [phone]: [phone]  Please contact Palliative Medicine Team phone at [phone] questions and concerns.  For individual provider: See AShea Evans

## 2017-11-19 NOTE — Progress Notes (Signed)
[facility] [place] [facility]   PATIENT NAME: Susan CoupJudy Greer    MR#:  [id number]  DATE OF BIRTH:  1941/05/01  SUBJECTIVE:  CHIEF COMPLAINT: Patient had a paracentesis yesterday, 5 L of fluid removed feeling better today. Discussed with the [facility] hepatologist and not considering to get transferred REVIEW OF SYSTEMS:  CONSTITUTIONAL: No fever,  Has fatigue or weakness.  EYES: No blurred or double vision.  EARS, NOSE, AND THROAT: No tinnitus or ear pain.  RESPIRATORY: No cough, shortness of breath, wheezing or hemoptysis.  CARDIOVASCULAR: No chest pain, orthopnea, edema.  GASTROINTESTINAL: No nausea, vomiting, reports abdominal distention GENITOURINARY: No dysuria, hematuria.  ENDOCRINE: No polyuria, nocturia,  HEMATOLOGY: No anemia, easy bruising or bleeding SKIN: No rash or lesion. MUSCULOSKELETAL: No joint pain or arthritis.   NEUROLOGIC: No tingling, numbness, weakness.  PSYCHIATRY: No anxiety or depression.   DRUG ALLERGIES:  No Known Allergies  VITALS:  Blood pressure (!) 101/40, pulse 66, temperature 97.9 F (36.6 C), temperature source Oral, resp. rate 12, height 5\' 3"  (1.6 m), weight 53.9 kg (118 lb 14.4 oz), SpO2 98 %.  PHYSICAL EXAMINATION:  GENERAL:  77 y.o.-year-old patient lying in the bed with no acute distress.  EYES: Pupils equal, round, reactive to light and accommodation. No scleral icterus. Extraocular muscles intact.  HEENT: Head atraumatic, normocephalic. Oropharynx and nasopharynx clear.  NECK:  Supple, no jugular venous distention. No thyroid enlargement, no tenderness.  LUNGS: Normal breath sounds bilaterally, no wheezing, rales,rhonchi or crepitation. No use of accessory muscles of respiration.  CARDIOVASCULAR: S1, S2 normal. No murmurs, rubs, or gallops.  ABDOMEN: Soft, nontender,  nondistended, improved ascites. Bowel sounds present. EXTREMITIES: No pedal edema, cyanosis, or clubbing.  NEUROLOGIC: Cranial nerves II through  XII are intact. Muscle strength generalized weakness in all extremities. Sensation intact. Gait not checked.  PSYCHIATRIC: The patient is alert and oriented x 3.  SKIN: No obvious rash, lesion, or ulcer.    LABORATORY PANEL:   CBC Recent Labs  Lab 11/19/17 0730  WBC 6.9  HGB 10.9*  HCT 31.9*  PLT 79*   ------------------------------------------------------------------------------------------------------------------  Chemistries  Recent Labs  Lab 11/19/17 0730  NA 135  K 4.4  CL 106  CO2 21*  GLUCOSE 106*  BUN 85*  CREATININE 4.07*  CALCIUM 8.3*  AST 30  ALT 14  ALKPHOS 135*  BILITOT 2.0*   ------------------------------------------------------------------------------------------------------------------  Cardiac Enzymes No results for input(s): TROPONINI in the last 168 hours. ------------------------------------------------------------------------------------------------------------------  RADIOLOGY:  [place] Renal  Result Date: 11/18/2017 CLINICAL DATA:  Acute renal failure EXAM: RENAL / URINARY TRACT ULTRASOUND COMPLETE COMPARISON:  Abdominal MRI dated January 01, 2017 FINDINGS: Right Kidney: Length: 10.5 cm. The cortical echotexture is increased diffusely and appears greater than that of the liver. There is a lower pole cyst which measures 1 cm in greatest dimension. There is no hydronephrosis. Left Kidney: Length: 10.8 cm. The renal cortical echotexture is mildly increased but not as great low flea is that on the right. There is no hydronephrosis. Bladder: The urinary bladder is decompressed by the Foley catheter. IMPRESSION: Increased renal cortical echotexture greatest on the right compatible with medical renal disease. There is no hydronephrosis. The urinary bladder was empty due to the presence of a Foley catheter. Electronically Signed   By: David  [place]azil[place]Jordan M.D.   On: 11/18/2017 12:57   [place] Abdomen Limited  Result Date: 11/18/2017 CLINICAL DATA:  Hepatic cirrhosis,  ascites. EXAM: LIMITED ABDOMEN ULTRASOUND FOR ASCITES TECHNIQUE: Limited ultrasound  survey for ascites was performed in all four abdominal quadrants. COMPARISON:  No recent study in PACs FINDINGS: A large amount of ascites is observed throughout the abdomen. The liver where visualized appears shrunken. Bowel loops are floating in the ascites. IMPRESSION: Large amount of ascites throughout the abdominal cavity. Electronically Signed   By: David  [place] M.D.   On: 11/18/2017 08:50   US Paracentesis  Result Date: 11/18/2017 INDICATION: Ascites EXAM: ULTRASOUND GUIDED DIAGNOSTIC AND THERAPEUTIC PARACENTESIS MEDICATIONS: None. COMPLICATIONS: None immediate. PROCEDURE: Informed written consent was obtained from the patient after a discussion of the risks, benefits and alternatives to treatment. A timeout was performed prior to the initiation of the procedure. Initial ultrasound scanning demonstrates a large amount of ascites within the right lower abdominal quadrant. The right lower abdomen was prepped and draped in the usual sterile fashion. 1% lidocaine with epinephrine was used for local anesthesia. Following this, a 6 Fr Safe-T-Centesis catheter was introduced. An ultrasound image was saved for documentation purposes. The paracentesis was performed. The catheter was removed and a dressing was applied. The patient tolerated the procedure well without immediate post procedural complication. FINDINGS: A total of approximately 5 L of clear yellow fluid was removed. Samples were sent to the laboratory as requested by the clinical team. IMPRESSION: Successful ultrasound-guided paracentesis yielding 5 liters of peritoneal fluid. Electronically Signed   By: Alcide Clever M.D.   On: 11/18/2017 12:30    EKG:   Orders placed or performed in visit on 07/23/17  . EKG 12-Lead    ASSESSMENT AND PLAN:    * Acute kidney injury due to hepatorenal syndrome  /overdiuresis Baseline creatinine 1.04; during the hospital  course 4.31-4.07 Foley catheter.    IV fluids.    nephrology following, discussed with Dr. Wynelle Link Very low threshold for dialysis but no acute indication at this time IV albumin  *End-stage liver disease with massive ascites Therapeutic paracentesis ultrasound-guided -performed on 11/18/2017.  5 L of fluid removed  following with [facility] hepatologist as an outpatient not on transplantation list, according to them patient is not a surgical candidate  palliative care is following   * Prolapsed uterus.  This is causing obstruction in turn causing acute kidney injury.  Discussed with Dr. Gaynelle Arabian of gynecology who will see the patient. Recommending to wrap prolapse daily with Vaseline Gauge, maintaining Foley Patient has been seen at [facility] gynecology in the past and not thought to be a surgical candidate due to cirrhosis GYN is recommending to transfer the patient to [facility] and [facility] team if they are seeking aggressive measures including surgery.  Patient and family are refusing to get transferred at this time   *History of SVT.  Continue Toprol-XL.    DVT prophylaxis with heparin subcu     All the records are reviewed and case discussed with Care Management/Social Workerr. Management plans discussed with the patient, son and daughter-in-law at bedside I had a lengthy discussion with them at bedside.  Patient's son will discuss with  his brother regarding dialysis, not considering transfer to [facility] at this time  CODE STATUS: fc   TOTAL TIME TAKING CARE OF THIS PATIENT: 35  minutes.   POSSIBLE D/C IN 2  DAYS, DEPENDING ON CLINICAL CONDITION.  Note: This dictation was prepared with Dragon dictation along with smaller phrase technology. Any transcriptional errors that result from this process are unintentional.   Ramonita Lab M.D on 11/19/2017 at 6:55 PM  Between 7am to 6pm - Pager - [phone]  After 6pm go to [email or web address] - password [email or web address] [facility]  [place]  [facility]  Office  [phone]  CC: Primary care physician; Allegra Grana, FNP

## 2017-11-20 DIAGNOSIS — N179 Acute kidney failure, unspecified: Secondary | ICD-10-CM

## 2017-11-20 DIAGNOSIS — K767 Hepatorenal syndrome: Secondary | ICD-10-CM

## 2017-11-20 DIAGNOSIS — N816 Rectocele: Secondary | ICD-10-CM

## 2017-11-20 DIAGNOSIS — N815 Vaginal enterocele: Secondary | ICD-10-CM

## 2017-11-20 LAB — COMPREHENSIVE METABOLIC PANEL
ALT: 12 U/L — AB (ref 14–54)
AST: 27 U/L (ref 15–41)
Albumin: 3.2 g/dL — ABNORMAL LOW (ref 3.5–5.0)
Alkaline Phosphatase: 125 U/L (ref 38–126)
Anion gap: 12 (ref 5–15)
BUN: 87 mg/dL — ABNORMAL HIGH (ref 6–20)
CHLORIDE: 108 mmol/L (ref 101–111)
CO2: 20 mmol/L — AB (ref 22–32)
CREATININE: 4.3 mg/dL — AB (ref 0.44–1.00)
Calcium: 8.4 mg/dL — ABNORMAL LOW (ref 8.9–10.3)
GFR calc non Af Amer: 9 mL/min — ABNORMAL LOW (ref >60)
GFR, EST AFRICAN AMERICAN: 11 mL/min — AB (ref >60)
Glucose, Bld: 110 mg/dL — ABNORMAL HIGH (ref 65–99)
Potassium: 4.7 mmol/L (ref 3.5–5.1)
SODIUM: 140 mmol/L (ref 135–145)
Total Bilirubin: 1.9 mg/dL — ABNORMAL HIGH (ref 0.3–1.2)
Total Protein: 6.1 g/dL — ABNORMAL LOW (ref 6.5–8.1)

## 2017-11-20 MED ORDER — POLYVINYL ALCOHOL 1.4 % OP SOLN
1.0000 [drp] | OPHTHALMIC | Status: DC | PRN
  Administered 2017-11-20: 1 [drp] via OPHTHALMIC
  Filled 2017-11-20: qty 15

## 2017-11-20 MED ORDER — DOCUSATE SODIUM 50 MG/5ML PO LIQD
100.0000 mg | Freq: Two times a day (BID) | ORAL | Status: DC
  Administered 2017-11-20 – 2017-11-21 (×3): 100 mg via ORAL
  Filled 2017-11-20 (×4): qty 10

## 2017-11-20 NOTE — Progress Notes (Signed)
Daily Progress Note   Patient Name: Susan Greer       Date: 11/20/2017 DOB: 1941-08-14  Age: 77 y.o. MRN#: [id number] Attending Physician: Ramonita Lab, MD Primary Care Physician: Allegra Grana, FNP Admit Date: 11/17/2017  Reason for Consultation/Follow-up: Establishing goals of care  Subjective: Patient awake, alert, oriented. In good spirits this afternoon. I helped assist her into bed from bathroom. Tolerated ambulation well.   GOC:  We discussed plan for watchful waiting with kidney function and possible diuretics soon per nephrology.   At this point, patient has not made decisions regarding advanced directives/code status. DIL, Chris at bedside tells me her sons are working on it with an attorney outpatient. Encouraged patient to continue conversations with family regarding her wishes with chronic, progressive liver cirrhosis.   Emotional support provided.   Length of Stay: 3  Current Medications: Scheduled Meds:  . conjugated estrogens  1 Applicatorful Vaginal Daily  . docusate  100 mg Oral BID  . metoprolol  200 mg Oral Daily  . pantoprazole  40 mg Oral Daily  . pneumococcal 23 valent vaccine  0.5 mL Intramuscular Tomorrow-1000  . sertraline  50 mg Oral QHS    Continuous Infusions: . albumin human 0 g (11/19/17 1515)    PRN Meds: acetaminophen **OR** acetaminophen, albuterol, clorazepate, HYDROcodone-acetaminophen, morphine injection, ondansetron **OR** ondansetron (ZOFRAN) IV, polyethylene glycol, polyvinyl alcohol  Physical Exam  Constitutional: She is oriented to person, place, and time. She is cooperative.  HENT:  Head: Normocephalic and atraumatic.  Pulmonary/Chest: Effort normal.  Abdominal: There is no tenderness.  Neurological: She is alert and oriented  to person, place, and time.  Skin: Skin is warm and dry.  Psychiatric: She has a normal mood and affect. Her speech is normal and behavior is normal. Cognition and memory are normal.  Nursing note and vitals reviewed.          Vital Signs: BP (!) 112/50 (BP Location: Right Arm)   Pulse 69   Temp 97.6 F (36.4 C) (Oral)   Resp 13   Ht 5\' 3"  (1.6 m)   Wt 54 kg (119 lb)   SpO2 99%   BMI 21.08 kg/m  SpO2: SpO2: 99 % O2 Device: O2 Device: Not Delivered O2 Flow Rate:    Intake/output summary:   Intake/Output Summary (Last  24 hours) at 11/20/2017 1210 Last data filed at 11/20/2017 1040 Gross per 24 hour  Intake 540 ml  Output 700 ml  Net -160 ml   LBM: Last BM Date: 11/19/17 Baseline Weight: Weight: 63.9 kg (140 lb 12.8 oz) Most recent weight: Weight: 54 kg (119 lb)     Palliative Assessment/Data: PPS 60%   Flowsheet Rows     Most Recent Value  Intake Tab  Referral Department  Hospitalist  Unit at Time of Referral  Med/Surg Unit  Palliative Care Primary Diagnosis  -- [liver cirrhosis, hepatorenal syndrome]  Date Notified  11/18/17  Palliative Care Type  New Palliative care  Reason for referral  Clarify Goals of Care  Date first seen by Palliative Care  11/19/17  # of days Palliative referral response time  1 Day(s)  Clinical Assessment  Psychosocial & Spiritual Assessment  Palliative Care Outcomes  Patient/Family meeting held?  Yes  Who was at the meeting?  patient, two sons, daughter-in-law, grandson  Palliative Care Outcomes  Clarified goals of care, Counseled regarding hospice, Provided end of life care assistance, Provided psychosocial or spiritual support, ACP counseling assistance      Patient Active Problem List   Diagnosis Date Noted  . Acute renal failure (HCC)   . Palliative care by specialist   . Ascites   . AKI (acute kidney injury) (HCC) 11/17/2017  . Cellulitis 07/29/2017  . Diarrhea 07/08/2017  . History of shingles 06/03/2016  . Cirrhosis of  liver (HCC) 06/03/2016  . Elevated LFTs 01/11/2016  . Macular degeneration, left eye 01/11/2016  . Vaginal prolapse 01/11/2016  . Goals of care, counseling/discussion 02/23/2014  . Depression 02/23/2014  . Anxiety state 02/23/2014  . Essential hypertension, benign 04/12/2013  . Female bladder prolapse 04/12/2013    Palliative Care Assessment & Plan   Patient Profile: 77 y.o. female  with past medical history of liver cirrhosis, uterine/bladder prolapse, HTN, UTI, SVT, GERD admitted on 11/17/2017 from nephrology office with elevated BUN and creatinine. Patient has been followed by Dr. Waynard Reeds with [facility] transplant team. Also followed by GI. No history of ETOH abuse or hepatitis. Patient is not a candidate for liver transplant. Presented with ascites s/p paracentesis 1/29 with 5L removed. Nephrology consulted on admission. Low threshold for hemodialysis. BUN/Creat on 1/30 are 85/4.07. Palliative medicine consultation for goals of care.  Assessment: Decompensated liver cirrhosis Acute kidney injury Hepatorenal syndrome Recurrent ascites Prolapsed uterus  Recommendations/Plan:  Continue FULL code/FULL scope.  Per patient/family, they are going working on living will with attorney.   Watchful waiting at this point. Discussed with Dr. Wynelle Link. Continue to monitor and may consider restarting low dose diuretics.   Patient/DIL agreeable with palliative services to follow on discharge. Likely home.   Code Status: FULL   Code Status Orders  (From admission, onward)        Start     Ordered   11/17/17 1654  Full code  Continuous     11/17/17 1654    Code Status History    Date Active Date Inactive Code Status Order ID Comments User Context   This patient has a current code status but no historical code status.    Advance Directive Documentation     Most Recent Value  Type of Advance Directive  Living will  Pre-existing out of facility DNR order (yellow form or pink MOST form)   No data  "MOST" Form in Place?  No data       Prognosis:  Unable to determine  Discharge Planning:  To Be Determined  Care plan was discussed with patient, family members at bedside, Dr. Wynelle LinkKolluru, Dr. Amado CoeGouru, RN  Thank you for allowing the Palliative Medicine Team to assist in the care of this patient.   Time In: 1310 Time Out: 1335 Total Time 25min Prolonged Time Billed no      Greater than 50%  of this time was spent counseling and coordinating care related to the above assessment and plan.  Vennie HomansMegan My Rinke, FNP-C Palliative Medicine Team  Phone: [phone] Fax: [phone]  Please contact Palliative Medicine Team phone at [phone] for questions and concerns.

## 2017-11-20 NOTE — Progress Notes (Signed)
[facility]  ROUNDING NOTE   Subjective:   Family at bedside.    UOP 700.   Creatinine 4.3 (4.07) (4.31)     Objective:  Vital signs in last 24 hours:  Temp:  [97.6 F (36.4 C)-98 F (36.7 C)] 97.6 F (36.4 C) (01/31 1252) Pulse Rate:  [64-70] 64 (01/31 1252) Resp:  [13-20] 20 (01/31 1252) BP: (107-115)/(49-58) 114/58 (01/31 1252) SpO2:  [96 %-100 %] 96 % (01/31 1252) Weight:  [54 kg (119 lb)] 54 kg (119 lb) (01/31 0500)  Weight change: 0.045 kg (1.6 oz) Filed Weights   11/18/17 0416 11/19/17 0352 11/20/17 0500  Weight: 55 kg (121 lb 4.1 oz) 53.9 kg (118 lb 14.4 oz) 54 kg (119 lb)    Intake/Output: I/O last 3 completed shifts: In: 702 [P.O.:240; IV Piggyback:462] Out: 700 [Urine:700]   Intake/Output this shift:  No intake/output data recorded.  Physical Exam: General: NAD, laying in bed  Head: Normocephalic, atraumatic. Moist oral mucosal membranes  Eyes: Anicteric, PERRL  Neck: Supple, trachea midline  Lungs:  Clear to auscultation  Heart: Regular rate and rhythm  Abdomen:  +ascites  Extremities: + peripheral edema.  Neurologic: Nonfocal, moving all four extremities  Skin: No lesions   GU Vaginal prolapse    Basic Metabolic Panel: Recent Labs  Lab 11/17/17 1710 11/18/17 0614 11/19/17 0730 11/20/17 0412  NA 136 135 135 140  K 4.9 4.5 4.4 4.7  CL 103 107 106 108  CO2 24 22 21* 20*  GLUCOSE 138* 102* 106* 110*  BUN 85* 85* 85* 87*  CREATININE 4.41* 4.31* 4.07* 4.30*  CALCIUM 8.3* 8.0* 8.3* 8.4*    Liver Function Tests: Recent Labs  Lab 11/17/17 1710 11/19/17 0730 11/20/17 0412  AST 43* 30 27  ALT 20 14 12*  ALKPHOS 174* 135* 125  BILITOT 1.7* 2.0* 1.9*  PROT 7.3 6.2* 6.1*  ALBUMIN 2.1* 2.9* 3.2*   No results for input(s): LIPASE, AMYLASE in the last 168 hours. No results for input(s): AMMONIA in the last 168 hours.  CBC: Recent Labs  Lab 11/17/17 1710 11/18/17 0614 11/19/17 0730  WBC 9.9 8.7 6.9  NEUTROABS 7.3*   --   --   HGB 13.0 11.9* 10.9*  HCT 38.1 35.4 31.9*  MCV 100.7* 100.8* 100.6*  PLT 141* 130* 79*    Cardiac Enzymes: No results for input(s): CKTOTAL, CKMB, CKMBINDEX, TROPONINI in the last 168 hours.  BNP: Invalid input(s): POCBNP  CBG: No results for input(s): GLUCAP in the last 168 hours.  Microbiology: Results for orders placed or performed during the hospital encounter of 11/17/17  Body fluid culture     Status: None (Preliminary result)   Collection Time: 11/18/17 12:00 PM  Result Value Ref Range Status   Specimen Description   Final    PERITONEAL Performed at [facility], [address] [place], [place] [address]    Special Requests   Final    NONE Performed at [facility], [address] [place], [place] [address]    Gram Stain NO WBC SEEN NO ORGANISMS SEEN   Final   Culture   Final    NO GROWTH 2 DAYS Performed at [facility], [address], [place], [place] [address]    Report Status PENDING  Incomplete    Coagulation Studies: No results for input(s): LABPROT, INR in the last 72 hours.  Urinalysis: Recent Labs    11/17/17 1656  COLORURINE YELLOW*  LABSPEC 1.012  PHURINE  5.0  GLUCOSEU NEGATIVE  HGBUR LARGE*  BILIRUBINUR NEGATIVE  KETONESUR NEGATIVE  PROTEINUR 30*  NITRITE NEGATIVE  LEUKOCYTESUR LARGE*      Imaging: No results found.   Medications:   . albumin human 0 g (11/19/17 1515)   . conjugated estrogens  1 Applicatorful Vaginal Daily  . docusate  100 mg Oral BID  . metoprolol  200 mg Oral Daily  . pantoprazole  40 mg Oral Daily  . pneumococcal 23 valent vaccine  0.5 mL Intramuscular Tomorrow-1000  . sertraline  50 mg Oral QHS   acetaminophen **OR** acetaminophen, albuterol, clorazepate, HYDROcodone-acetaminophen, morphine injection, ondansetron **OR** ondansetron (ZOFRAN) IV, polyethylene glycol, polyvinyl alcohol  Assessment/ Plan:  [name] is a 77 y.o. white female with hypertension,  liver cirrhosis possibly secondary to fatty liver disease, SVT, history of uterine fibroids, depression, and vaginal prolapse   1. Acute renal failure: baseline creatinine of 1.04 with GFR of 49 on 07/29/2017.  Acute renal failure secondary to either over diuresis versus acute hepatorenal syndrome - failed outpatient diuretic therapy - Watch for improvement status post large volume paracentesis. 5 liters removed on 1/29.  - low threshold for dialysis. No acute indication for dialysis. May consider restarting low dose diuretics.  - Appreciate palliative care input.    2. Anasarca/ascites: patient with volume overload and hypoalbuminemia - paracentesis 1/29: removed 5 liters.  - Continue IV albumin - holding diuretics due to acute renal failure   LOS: 3 Kiran Carline 1/31/20192:38 PM

## 2017-11-20 NOTE — Progress Notes (Signed)
[facility] - [place] at [facility]   PATIENT NAME: Susan Greer    MR#:  [id number]  DATE OF BIRTH:  12-May-1941  SUBJECTIVE:  CHIEF COMPLAINT: Patient had a paracentesis , 5 L of fluid removed feeling better , family members are not considering dialysis Discussed with the [facility] hepatologist and not considering to get transferred REVIEW OF SYSTEMS:  CONSTITUTIONAL: No fever,  Has fatigue or weakness.  EYES: No blurred or double vision.  EARS, NOSE, AND THROAT: No tinnitus or ear pain.  RESPIRATORY: No cough, shortness of breath, wheezing or hemoptysis.  CARDIOVASCULAR: No chest pain, orthopnea, edema.  GASTROINTESTINAL: No nausea, vomiting, reports abdominal distention GENITOURINARY: No dysuria, hematuria.  ENDOCRINE: No polyuria, nocturia,  HEMATOLOGY: No anemia, easy bruising or bleeding SKIN: No rash or lesion. MUSCULOSKELETAL: No joint pain or arthritis.   NEUROLOGIC: No tingling, numbness, weakness.  PSYCHIATRY: No anxiety or depression.   DRUG ALLERGIES:  No Known Allergies  VITALS:  Blood pressure 121/61, pulse 67, temperature 98.1 F (36.7 C), temperature source Oral, resp. rate 18, height 5\' 3"  (1.6 m), weight 54 kg (119 lb), SpO2 99 %.  PHYSICAL EXAMINATION:  GENERAL:  77 y.o.-year-old patient lying in the bed with no acute distress.  EYES: Pupils equal, round, reactive to light and accommodation. No scleral icterus. Extraocular muscles intact.  HEENT: Head atraumatic, normocephalic. Oropharynx and nasopharynx clear.  NECK:  Supple, no jugular venous distention. No thyroid enlargement, no tenderness.  LUNGS: Normal breath sounds bilaterally, no wheezing, rales,rhonchi or crepitation. No use of accessory muscles of respiration.  CARDIOVASCULAR: S1, S2 normal. No murmurs, rubs, or gallops.  ABDOMEN: Soft, nontender,  nondistended, improved ascites. Bowel sounds present. Genitourinary: Vaginal prolapse with some elevations, rectocele, enterocele  and cystocele EXTREMITIES: No pedal edema, cyanosis, or clubbing.  NEUROLOGIC: Cranial nerves II through XII are intact. Muscle strength generalized weakness in all extremities. Sensation intact. Gait not checked.  PSYCHIATRIC: The patient is alert and oriented x 3.  SKIN: No obvious rash, lesion, or ulcer.    LABORATORY PANEL:   CBC Recent Labs  Lab 11/19/17 0730  WBC 6.9  HGB 10.9*  HCT 31.9*  PLT 79*   ------------------------------------------------------------------------------------------------------------------  Chemistries  Recent Labs  Lab 11/20/17 0412  NA 140  K 4.7  CL 108  CO2 20*  GLUCOSE 110*  BUN 87*  CREATININE 4.30*  CALCIUM 8.4*  AST 27  ALT 12*  ALKPHOS 125  BILITOT 1.9*   ------------------------------------------------------------------------------------------------------------------  Cardiac Enzymes No results for input(s): TROPONINI in the last 168 hours. ------------------------------------------------------------------------------------------------------------------  RADIOLOGY:  No results found.  EKG:   Orders placed or performed in visit on 07/23/17  . EKG 12-Lead    ASSESSMENT AND PLAN:    * Acute kidney injury due to hepatorenal syndrome  /overdiuresis Baseline creatinine 1.04; during the hospital course 4.31-4.07-4.30 Foley catheter.    IV fluids  Provided   nephrology following, discussed with Dr. Wynelle Link Very low threshold for dialysis but no acute indication at this time IV albumin Considering to restart low-dose diuretics  *End-stage liver disease with massive ascites Therapeutic paracentesis ultrasound-guided -performed on 11/18/2017.  5 L of fluid removed  following with [facility] hepatologist as an outpatient not on transplantation list, according to them patient is not a surgical candidate  palliative care is following   * Prolapsed   Vaginal with cystocele, rectocele and enterocele  This is causing obstruction  in turn causing acute kidney injury.  Pessary placed after reducing the vaginal,  done by Dr. Tiburcio PeaHarris Outpatient follow-up in 1-2 weeks after discharge is recommended  Recommending to wrap prolapse daily with Vaseline Gauge, maintaining Foley, which can be removed in the long-term [facility] the past and not thought to be a surgical candidate due to cirrhosis GYN is recommending to transfer the patient to [facility] and [facility] team if they are seeking aggressive measures including surgery.  Patient and family are refusing to get transferred at this time   *History of SVT.  Continue Toprol-XL.    DVT prophylaxis with heparin subcu  Palliative care is following.  Patient is full code but patients   stents are seen  that they might consider comfort measures eventually.  As per their discussion with palliative care they are considering to go home and palliative care follow-up as an outpatient  All the records are reviewed and case discussed with Care Management/Social Workerr. Management plans discussed with the patient, son and daughter-in-law at bedside I had a discussion with them at bedside.  Patient's sons and patient are not considering hemodialysis , not considering transfer to [facility] at this time  CODE STATUS: fc   TOTAL TIME TAKING CARE OF THIS PATIENT: 35  minutes.   POSSIBLE D/C IN 1  DAYS, DEPENDING ON CLINICAL CONDITION.  Note: This dictation was prepared with Dragon dictation along with smaller phrase technology. Any transcriptional errors that result from this process are unintentional.   Ramonita LabAruna Valentine Kuechle M.D on 11/20/2017 at 8:55 PM  Between 7am to 6pm - Pager - [phone] After 6pm go to [email or web address] - password [email or web address] [facility]  [facility]  Office  [phone]  CC: Primary care physician; Allegra GranaArnett, Margaret G, FNP

## 2017-11-20 NOTE — Progress Notes (Signed)
PN:      [name] is a 77 y.o. who presents today with symptoms related to her pelvic floor weakening. She has complete prolapse of vaginal tissues (prior hysterectomy) and this cause difficulty w void and ambulation.  She has excoriations on tissue from exposure.  She has had recent ascites and swollen abdomen that exacerbated sx's.  She has tried ring pessaries in past w expulsion.  [facility]  Today we will try a different type of pessary to try to reduce prolapse.  PMHx: She  has a past medical history of GERD (gastroesophageal reflux disease), Hypertension, Kidney stones, SVT (supraventricular tachycardia) (HCC), and UTI (urinary tract infection). Also,  has a past surgical history that includes Abdominal hysterectomy and Esophagogastroduodenoscopy (egd) with propofol (N/A, 01/27/2017)., family history includes Cancer (age of onset: 5430) in her sister; Cancer (age of onset: 788) in her mother; Diabetes in her brother; Hypertension in her brother, father, and mother; Pneumonia in her father.,  reports that  has never smoked. she has never used smokeless tobacco. She reports that she does not drink alcohol or use drugs.  Current Facility-Administered Medications:  .  acetaminophen (TYLENOL) tablet 650 mg, 650 mg, Oral, Q6H PRN **OR** acetaminophen (TYLENOL) suppository 650 mg, 650 mg, Rectal, Q6H PRN, Sudini, Srikar, MD .  albumin human 25 % solution 25 g, 25 g, Intravenous, Q8H, Kolluru, Sarath, MD, Last Rate: 0 mL/hr at 11/19/17 1515, 25 g at 11/20/17 1306 .  albuterol (PROVENTIL) (2.5 MG/3ML) 0.083% nebulizer solution 2.5 mg, 2.5 mg, Nebulization, Q2H PRN, Sudini, Srikar, MD .  clorazepate (TRANXENE) tablet 7.5 mg, 7.5 mg, Oral, BID PRN, Sudini, Srikar, MD .  conjugated estrogens (PREMARIN) vaginal cream 1 Applicatorful, 1 Applicatorful, Vaginal, Daily, Schuman, Christanna R, MD, 1 Applicatorful at 11/20/17 1027 .  docusate (COLACE) 50 MG/5ML liquid 100 mg, 100 mg, Oral, BID,  Kolluru, Sarath, MD, 100 mg at 11/20/17 1026 .  HYDROcodone-acetaminophen (NORCO/VICODIN) 5-325 MG per tablet 1 tablet, 1 tablet, Oral, Q4H PRN, Oralia ManisWillis, David, MD, 1 tablet at 11/19/17 2244 .  metoprolol succinate (TOPROL-XL) 24 hr tablet 200 mg, 200 mg, Oral, Daily, Sudini, Srikar, MD, 200 mg at 11/18/17 1020 .  morphine 2 MG/ML injection 2 mg, 2 mg, Intravenous, Q4H PRN, Oralia ManisWillis, David, MD, 2 mg at 11/20/17 1621 .  ondansetron (ZOFRAN) tablet 4 mg, 4 mg, Oral, Q6H PRN **OR** ondansetron (ZOFRAN) injection 4 mg, 4 mg, Intravenous, Q6H PRN, Sudini, Srikar, MD, 4 mg at 11/19/17 1024 .  pantoprazole (PROTONIX) EC tablet 40 mg, 40 mg, Oral, Daily, Sudini, Srikar, MD, 40 mg at 11/20/17 1026 .  pneumococcal 23 valent vaccine (PNU-IMMUNE) injection 0.5 mL, 0.5 mL, Intramuscular, Tomorrow-1000, Gouru, Aruna, MD .  polyethylene glycol (MIRALAX / GLYCOLAX) packet 17 g, 17 g, Oral, Daily PRN, Sudini, Srikar, MD .  polyvinyl alcohol (LIQUIFILM TEARS) 1.4 % ophthalmic solution 1 drop, 1 drop, Both Eyes, PRN, Gouru, Aruna, MD, 1 drop at 11/20/17 0541 .  sertraline (ZOLOFT) tablet 50 mg, 50 mg, Oral, QHS, Sudini, Srikar, MD  Also, has No Known Allergies.  Review of Systems  All other systems reviewed and are negative.  Objective: BP (!) 114/58 (BP Location: Left Arm)   Pulse 64   Temp 97.6 F (36.4 C) (Oral)   Resp 20   Ht 5\' 3"  (1.6 m)   Wt 119 lb (54 kg)   SpO2 96%   BMI 21.08 kg/m  Physical Exam  Constitutional: She is oriented to person,  place, and time. She appears well-developed and well-nourished. No distress.  Genitourinary: Vagina normal. Pelvic exam was performed with patient supine. There is no rash, tenderness or lesion on the right labia. There is no rash, tenderness or lesion on the left labia. No erythema or bleeding in the vagina.  Genitourinary Comments: Absent uterus and cervix Complete prolapse of vaginal tissues including cystocele, rectocele, and eneterocele Weakened vaginal  tissues Some erosions noted, no deep penetration or abscess No palpable masses  Abdominal: Soft. She exhibits no distension. There is no tenderness.  Musculoskeletal: Normal range of motion.  Neurological: She is alert and oriented to person, place, and time. No cranial nerve deficit.  Skin: Skin is warm and dry.  Psychiatric: She has a normal mood and affect.   Pessary Procedure Gehrung #5 pessary placed after gradual reduction and replacement of tissues back into the vagina. Pessary expanded into position.  Correct size, any bigger would possible obstruct urethra.  No immediate expulsion.  Pt tolerated procedure well  A/P:  Complete Vaginal Prolapse with Cystocele, Rectocele, Enterocele Instructions given for care. Concerning symptoms to observe for are counseled to patient. Follow up upon discharge in my office in 1-2 weeks Expulsion a possibility and a liklihood. If so, no other options other than surgery, which she is not a candidate for. Foley continuation is up to medical team.  Long term would not expect need for catheter.  Annamarie Major, MD, [facility], [facility] 11/20/2017  4:40 PM

## 2017-11-21 DIAGNOSIS — N816 Rectocele: Secondary | ICD-10-CM

## 2017-11-21 DIAGNOSIS — N815 Vaginal enterocele: Secondary | ICD-10-CM

## 2017-11-21 LAB — CBC
HCT: 31.4 % — ABNORMAL LOW (ref 35.0–47.0)
HEMOGLOBIN: 10.5 g/dL — AB (ref 12.0–16.0)
MCH: 34 pg (ref 26.0–34.0)
MCHC: 33.6 g/dL (ref 32.0–36.0)
MCV: 101.2 fL — AB (ref 80.0–100.0)
Platelets: 85 10*3/uL — ABNORMAL LOW (ref 150–440)
RBC: 3.1 MIL/uL — AB (ref 3.80–5.20)
RDW: 16.5 % — ABNORMAL HIGH (ref 11.5–14.5)
WBC: 8.7 10*3/uL (ref 3.6–11.0)

## 2017-11-21 LAB — COMPREHENSIVE METABOLIC PANEL
ALBUMIN: 4.4 g/dL (ref 3.5–5.0)
ALK PHOS: 84 U/L (ref 38–126)
ALT: 11 U/L — AB (ref 14–54)
ANION GAP: 11 (ref 5–15)
AST: 24 U/L (ref 15–41)
BUN: 86 mg/dL — ABNORMAL HIGH (ref 6–20)
CO2: 22 mmol/L (ref 22–32)
Calcium: 8.9 mg/dL (ref 8.9–10.3)
Chloride: 106 mmol/L (ref 101–111)
Creatinine, Ser: 4.28 mg/dL — ABNORMAL HIGH (ref 0.44–1.00)
GFR calc non Af Amer: 9 mL/min — ABNORMAL LOW (ref >60)
GFR, EST AFRICAN AMERICAN: 11 mL/min — AB (ref >60)
GLUCOSE: 106 mg/dL — AB (ref 65–99)
Potassium: 4.6 mmol/L (ref 3.5–5.1)
SODIUM: 139 mmol/L (ref 135–145)
Total Bilirubin: 2.4 mg/dL — ABNORMAL HIGH (ref 0.3–1.2)
Total Protein: 6.8 g/dL (ref 6.5–8.1)

## 2017-11-21 MED ORDER — FUROSEMIDE 40 MG PO TABS
40.0000 mg | ORAL_TABLET | Freq: Every day | ORAL | Status: DC
  Administered 2017-11-21 – 2017-11-22 (×2): 40 mg via ORAL
  Filled 2017-11-21 (×2): qty 1

## 2017-11-21 MED ORDER — SPIRONOLACTONE 25 MG PO TABS
25.0000 mg | ORAL_TABLET | Freq: Every day | ORAL | Status: DC
  Administered 2017-11-21 – 2017-11-22 (×2): 25 mg via ORAL
  Filled 2017-11-21 (×2): qty 1

## 2017-11-21 MED ORDER — DOCUSATE SODIUM 100 MG PO CAPS
100.0000 mg | ORAL_CAPSULE | Freq: Two times a day (BID) | ORAL | Status: DC
  Administered 2017-11-21 – 2017-11-24 (×6): 100 mg via ORAL
  Filled 2017-11-21 (×6): qty 1

## 2017-11-21 MED ORDER — DIPHENHYDRAMINE HCL 25 MG PO CAPS
25.0000 mg | ORAL_CAPSULE | Freq: Three times a day (TID) | ORAL | Status: DC | PRN
  Administered 2017-11-21 – 2017-11-22 (×2): 25 mg via ORAL
  Filled 2017-11-21 (×2): qty 1

## 2017-11-21 NOTE — Progress Notes (Signed)
[facility] [place] at [facility]   PATIENT NAME: Susan Greer    MR#:  [id number]  DATE OF BIRTH:  11/11/1940  SUBJECTIVE:  CHIEF COMPLAINT: Patient had a paracentesis , 5 L of fluid removed feeling better , family members are not considering dialysis Discussed with the [facility] hepatologist and not considering to get transferred REVIEW OF SYSTEMS:  CONSTITUTIONAL: No fever,  Has fatigue or weakness.  EYES: No blurred or double vision.  EARS, NOSE, AND THROAT: No tinnitus or ear pain.  RESPIRATORY: No cough, shortness of breath, wheezing or hemoptysis.  CARDIOVASCULAR: No chest pain, orthopnea, edema.  GASTROINTESTINAL: No nausea, vomiting, reports abdominal distention GENITOURINARY: No dysuria, hematuria.  ENDOCRINE: No polyuria, nocturia,  HEMATOLOGY: No anemia, easy bruising or bleeding SKIN: No rash or lesion. MUSCULOSKELETAL: No joint pain or arthritis.   NEUROLOGIC: No tingling, numbness, weakness.  PSYCHIATRY: No anxiety or depression.   DRUG ALLERGIES:  No Known Allergies  VITALS:  Blood pressure (!) 107/45, pulse 74, temperature 98.5 F (36.9 C), temperature source Oral, resp. rate 18, height 5\' 3"  (1.6 m), weight 51.9 kg (114 lb 6.7 oz), SpO2 96 %.  PHYSICAL EXAMINATION:  GENERAL:  77 y.o.-year-old patient lying in the bed with no acute distress.  EYES: Pupils equal, round, reactive to light and accommodation. No scleral icterus. Extraocular muscles intact.  HEENT: Head atraumatic, normocephalic. Oropharynx and nasopharynx clear.  NECK:  Supple, no jugular venous distention. No thyroid enlargement, no tenderness.  LUNGS: Normal breath sounds bilaterally, no wheezing, rales,rhonchi or crepitation. No use of accessory muscles of respiration.  CARDIOVASCULAR: S1, S2 normal. No murmurs, rubs, or gallops.  ABDOMEN: Soft, nontender,  nondistended, improved ascites. Bowel sounds present. Genitourinary: Vaginal prolapse with some elevations, rectocele,  enterocele and cystocele EXTREMITIES: No pedal edema, cyanosis, or clubbing.  NEUROLOGIC: Cranial nerves II through XII are intact. Muscle strength generalized weakness in all extremities. Sensation intact. Gait not checked.  PSYCHIATRIC: The patient is alert and oriented x 3.  SKIN: No obvious rash, lesion, or ulcer.    LABORATORY PANEL:   CBC Recent Labs  Lab 11/21/17 0603  WBC 8.7  HGB 10.5*  HCT 31.4*  PLT 85*   ------------------------------------------------------------------------------------------------------------------  Chemistries  Recent Labs  Lab 11/21/17 0603  NA 139  K 4.6  CL 106  CO2 22  GLUCOSE 106*  BUN 86*  CREATININE 4.28*  CALCIUM 8.9  AST 24  ALT 11*  ALKPHOS 84  BILITOT 2.4*   ------------------------------------------------------------------------------------------------------------------  Cardiac Enzymes No results for input(s): TROPONINI in the last 168 hours. ------------------------------------------------------------------------------------------------------------------  RADIOLOGY:  No results found.  EKG:   Orders placed or performed in visit on 07/23/17  . EKG 12-Lead    ASSESSMENT AND PLAN:    * Acute kidney injury due to hepatorenal syndrome  /overdiuresis Baseline creatinine 1.04; during the hospital course 4.31-4.07-4.30 Foley catheter.    IV fluids  Provided   nephrology following, discussed with Dr. Wynelle LinkKolluru Very low threshold for dialysis but no acute indication at this time IV albumin  restarted low-dose diuretics Lasix 40 and spironolactone 25  *End-stage liver disease with massive ascites Therapeutic paracentesis ultrasound-guided -performed on 11/18/2017.  5 L of fluid removed  following with [facility] hepatologist as an outpatient not on transplantation list, according to them patient is not a surgical candidate  palliative care is following   * Prolapsed   Vaginal with cystocele, rectocele and enterocele   This is causing obstruction in turn causing acute kidney injury.  Pessary placed after reducing the vaginal, done by Dr. Tiburcio Pea Outpatient follow-up in 1-2 weeks after discharge is recommended  Recommending to wrap prolapse daily with Vaseline Gauge, maintaining Foley, which can be removed in the long-term Patient has been seen at [facility] gynecology in the past and not thought to be a surgical candidate due to cirrhosis GYN is recommending to transfer the patient to [facility] and [facility] team if they are seeking aggressive measures including surgery.  Patient and family are refusing to get transferred at this time   *History of SVT.  Continue Toprol-XL.    DVT prophylaxis with heparin subcu  PT consulted  Palliative care is following.  Patient is full code   All the records are reviewed and case discussed with Care Management/Social Workerr. Management plans discussed with the patient, son and daughter-in-law at bedside I had a discussion with them at bedside.  Patient's sons and patient are not considering hemodialysis , not considering transfer to [facility] at this time  CODE STATUS: fc   TOTAL TIME TAKING CARE OF THIS PATIENT: 35  minutes.   POSSIBLE D/C IN 1  DAYS, DEPENDING ON CLINICAL CONDITION.  Note: This dictation was prepared with Dragon dictation along with smaller phrase technology. Any transcriptional errors that result from this process are unintentional.   Ramonita Lab M.D on 11/21/2017 at 3:43 PM  Between 7am to 6pm - Pager - [phone] After 6pm go to [email or web address] - password [email or web address] [facility]  [facility]  Office  [phone]  CC: Primary care physician; Allegra Grana, FNP

## 2017-11-21 NOTE — Progress Notes (Signed)
PT Cancellation Note  Patient Details Name: Tamera PuntJudy I Hada MRN: [id number] DOB: 1941/07/13   Cancelled Treatment:    Reason Eval/Treat Not Completed: Other (comment). Received consult. No status change since previous screen. RN to confirm. Per MD, pt at high risk for expulsion with increased ambulation. Will cancel current order. Attempted to page Dr. Amado CoeGouru with no response. RN aware. Please re-order if needs change.   Jamyrah Saur 11/21/2017, 2:32 PM  Elizabeth PalauStephanie Lerae Langham, PT, DPT [phone]

## 2017-11-21 NOTE — Progress Notes (Signed)
Dr. Amado CoeGouru notified of patients blood pressure of 109/51 and 107/45. Dr. also notified that patient had received her morning dose of metoprolol. She was also given her new scheduled dose of spironolactone and furosemide. When patient assessed, she had no new complaints. Resting comfortably in bed. Per MD, continue to monitor patients blood pressure. Will continue to monitor.

## 2017-11-21 NOTE — Plan of Care (Signed)
  Education: Knowledge of General Education information will improve 11/21/2017 0140 - Progressing by Geri SeminoleJackson, Adarryl Goldammer A, RN   Health Behavior/Discharge Planning: Ability to manage health-related needs will improve 11/21/2017 0140 - Progressing by Geri SeminoleJackson, Zubair Lofton A, RN   Clinical Measurements: Ability to maintain clinical measurements within normal limits will improve 11/21/2017 0140 - Progressing by Geri SeminoleJackson, Zarriah Starkel A, RN Will remain free from infection 11/21/2017 0140 - Progressing by Geri SeminoleJackson, Delcia Spitzley A, RN Diagnostic test results will improve 11/21/2017 0140 - Progressing by Geri SeminoleJackson, Stefannie Defeo A, RN Respiratory complications will improve 11/21/2017 0140 - Progressing by Geri SeminoleJackson, Vanessa Alesi A, RN Cardiovascular complication will be avoided 11/21/2017 0140 - Progressing by Geri SeminoleJackson, Jayel Scaduto A, RN   Activity: Risk for activity intolerance will decrease 11/21/2017 0140 - Progressing by Geri SeminoleJackson, Annaliz Aven A, RN   Nutrition: Adequate nutrition will be maintained 11/21/2017 0140 - Progressing by Geri SeminoleJackson, Eladia Frame A, RN   Coping: Level of anxiety will decrease 11/21/2017 0140 - Progressing by Geri SeminoleJackson, Curtiss Mahmood A, RN   Elimination: Will not experience complications related to bowel motility 11/21/2017 0140 - Progressing by Geri SeminoleJackson, Evalene Vath A, RN Will not experience complications related to urinary retention 11/21/2017 0140 - Progressing by Geri SeminoleJackson, Wren Pryce A, RN   Pain Managment: General experience of comfort will improve 11/21/2017 0140 - Progressing by Geri SeminoleJackson, Tomie Elko A, RN   Safety: Ability to remain free from injury will improve 11/21/2017 0140 - Progressing by Geri SeminoleJackson, Kimber Esterly A, RN   Skin Integrity: Risk for impaired skin integrity will decrease 11/21/2017 0140 - Progressing by Geri SeminoleJackson, Feliberto Stockley A, RN

## 2017-11-21 NOTE — Progress Notes (Signed)
Subjective: Patient states has done well since pessary placement.  Denies any pessary related discomfort, or symptoms of expulsion.  She has been minimally mobile secondary to foley catheter.    Objective: Vital signs in last 24 hours: Temp:  [97.6 F (36.4 C)-98.5 F (36.9 C)] 98.5 F (36.9 C) (02/01 0430) Pulse Rate:  [64-72] 72 (02/01 0725) Resp:  [16-20] 18 (02/01 0725) BP: (108-121)/(50-61) 112/59 (02/01 0725) SpO2:  [96 %-99 %] 97 % (02/01 0725) Weight:  [114 lb 6.7 oz (51.9 kg)-119 lb 0.8 oz (54 kg)] 114 lb 6.7 oz (51.9 kg) (02/01 0725) Last BM Date: 11/19/17  Intake/Output from previous day: 01/31 0701 - 02/01 0700 In: 0  Out: 450 [Urine:450]  Physical Examination: Physical Exam  Constitutional: No distress.  Genitourinary: Vagina normal. No vaginal discharge found.  HENT:  Head: Normocephalic and atraumatic.  Nose: Nose normal.  Eyes: No scleral icterus.  Pulmonary/Chest: No respiratory distress.  Skin: She is not diaphoretic.   The pessary appears to be in place with no prolapse evidence.    Labs: Results for orders placed or performed during the hospital encounter of 11/17/17 (from the past 72 hour(s))  Body fluid cell count with differential     Status: Abnormal   Collection Time: 11/18/17 12:00 PM  Result Value Ref Range   Fluid Type-FCT PERITONEAL     Comment: CORRECTED ON 01/29 AT 1239: PREVIOUSLY REPORTED AS CYTOPERI   Color, Fluid YELLOW (A) YELLOW   Appearance, Fluid HAZY (A) CLEAR   WBC, Fluid 233 cu mm   Neutrophil Count, Fluid 29 %   Lymphs, Fluid 59 %   Monocyte-Macrophage-Serous Fluid 12 %   Eos, Fluid 0 %    Comment: Performed at [facility], [address] [place], [place] [address]  Body fluid culture     Status: None (Preliminary result)   Collection Time: 11/18/17 12:00 PM  Result Value Ref Range   Specimen Description      PERITONEAL Performed at [facility], [address] [place], [place] [address]    Special Requests      NONE Performed at [facility], [address], [place], [place] [address]    Gram Stain NO WBC SEEN NO ORGANISMS SEEN     Culture      NO GROWTH 2 DAYS Performed at [facility], [address], [place], [place] [address]    Report Status PENDING   Albumin, pleural or peritoneal fluid     Status: None   Collection Time: 11/18/17 12:00 PM  Result Value Ref Range   Albumin, Fluid <1.0 g/dL    Comment: (NOTE) No normal range established for this test Results should be evaluated in conjunction with serum values    Fluid Type-FALB PERITONEAL     Comment: Performed at [facility], [address] [place], [place] [address] CORRECTED ON 01/29 AT 1239: PREVIOUSLY REPORTED AS CYTOPERI   Glucose, pleural or peritoneal fluid     Status: None   Collection Time: 11/18/17 12:00 PM  Result Value Ref Range   Glucose, Fluid 132 mg/dL    Comment: (NOTE) No normal range established for this test Results should be evaluated in conjunction with serum values    Fluid Type-FGLU PERITONEAL     Comment: Performed at [facility], [address] [place], [place] [address] CORRECTED ON 01/29 AT 1239: PREVIOUSLY REPORTED AS CYTOPERI   Pathologist smear review     Status: None  Collection Time: 11/18/17 12:00 PM  Result Value Ref Range   Path Review Cytospin of peritoneal fluid is reviewed.     Comment: Negative for malignancy. Absolute neutrophil count 68 cells per cu mm. Reviewed by Dellia Nims Reuel Derby, M.D. Performed at [facility], [address] [place], [place] [address]   CBC     Status: Abnormal   Collection Time: 11/19/17  7:30 AM  Result Value Ref Range   WBC 6.9 3.6 - 11.0 K/uL   RBC 3.17 (L) 3.80 - 5.20 MIL/uL   Hemoglobin 10.9 (L) 12.0 - 16.0 g/dL   HCT 31.9 (L) 35.0 - 47.0 %   MCV 100.6 (H) 80.0 - 100.0 fL   MCH 34.4 (H) 26.0 - 34.0 pg   MCHC 34.2 32.0 - 36.0 g/dL   RDW 16.3 (H) 11.5 - 14.5 %   Platelets  79 (L) 150 - 440 K/uL    Comment: Performed at [facility], [address] [place], [place] [address]  Comprehensive metabolic panel     Status: Abnormal   Collection Time: 11/19/17  7:30 AM  Result Value Ref Range   Sodium 135 135 - 145 mmol/L   Potassium 4.4 3.5 - 5.1 mmol/L   Chloride 106 101 - 111 mmol/L   CO2 21 (L) 22 - 32 mmol/L   Glucose, Bld 106 (H) 65 - 99 mg/dL   BUN 85 (H) 6 - 20 mg/dL   Creatinine, Ser 4.07 (H) 0.44 - 1.00 mg/dL   Calcium 8.3 (L) 8.9 - 10.3 mg/dL   Total Protein 6.2 (L) 6.5 - 8.1 g/dL   Albumin 2.9 (L) 3.5 - 5.0 g/dL   AST 30 15 - 41 U/L   ALT 14 14 - 54 U/L   Alkaline Phosphatase 135 (H) 38 - 126 U/L   Total Bilirubin 2.0 (H) 0.3 - 1.2 mg/dL   GFR calc non Af Amer 10 (L) >60 mL/min   GFR calc Af Amer 11 (L) >60 mL/min    Comment: (NOTE) The eGFR has been calculated using the CKD EPI equation. This calculation has not been validated in all clinical situations. eGFR's persistently <60 mL/min signify possible Chronic Kidney Disease.    Anion gap 8 5 - 15    Comment: Performed at [facility], [address] [place],  [address]  Comprehensive metabolic panel     Status: Abnormal   Collection Time: 11/20/17  4:12 AM  Result Value Ref Range   Sodium 140 135 - 145 mmol/L   Potassium 4.7 3.5 - 5.1 mmol/L   Chloride 108 101 - 111 mmol/L   CO2 20 (L) 22 - 32 mmol/L   Glucose, Bld 110 (H) 65 - 99 mg/dL   BUN 87 (H) 6 - 20 mg/dL   Creatinine, Ser 4.30 (H) 0.44 - 1.00 mg/dL   Calcium 8.4 (L) 8.9 - 10.3 mg/dL   Total Protein 6.1 (L) 6.5 - 8.1 g/dL   Albumin 3.2 (L) 3.5 - 5.0 g/dL   AST 27 15 - 41 U/L   ALT 12 (L) 14 - 54 U/L   Alkaline Phosphatase 125 38 - 126 U/L   Total Bilirubin 1.9 (H) 0.3 - 1.2 mg/dL   GFR calc non Af Amer 9 (L) >60 mL/min   GFR calc Af Amer 11 (L) >60 mL/min    Comment: (NOTE) The eGFR has been calculated using the CKD EPI equation. This calculation has not been validated in all clinical  situations. eGFR's persistently <60  mL/min signify possible Chronic Kidney Disease.    Anion gap 12 5 - 15    Comment: Performed at [facility], [address] [place], [place] [address]  Comprehensive metabolic panel     Status: Abnormal   Collection Time: 11/21/17  6:03 AM  Result Value Ref Range   Sodium 139 135 - 145 mmol/L   Potassium 4.6 3.5 - 5.1 mmol/L   Chloride 106 101 - 111 mmol/L   CO2 22 22 - 32 mmol/L   Glucose, Bld 106 (H) 65 - 99 mg/dL   BUN 86 (H) 6 - 20 mg/dL   Creatinine, Ser 4.28 (H) 0.44 - 1.00 mg/dL   Calcium 8.9 8.9 - 10.3 mg/dL   Total Protein 6.8 6.5 - 8.1 g/dL   Albumin 4.4 3.5 - 5.0 g/dL   AST 24 15 - 41 U/L   ALT 11 (L) 14 - 54 U/L   Alkaline Phosphatase 84 38 - 126 U/L   Total Bilirubin 2.4 (H) 0.3 - 1.2 mg/dL   GFR calc non Af Amer 9 (L) >60 mL/min   GFR calc Af Amer 11 (L) >60 mL/min    Comment: (NOTE) The eGFR has been calculated using the CKD EPI equation. This calculation has not been validated in all clinical situations. eGFR's persistently <60 mL/min signify possible Chronic Kidney Disease.    Anion gap 11 5 - 15    Comment: Performed at [facility], [address] [place], [place] [address]  CBC     Status: Abnormal   Collection Time: 11/21/17  6:03 AM  Result Value Ref Range   WBC 8.7 3.6 - 11.0 K/uL   RBC 3.10 (L) 3.80 - 5.20 MIL/uL   Hemoglobin 10.5 (L) 12.0 - 16.0 g/dL   HCT 31.4 (L) 35.0 - 47.0 %   MCV 101.2 (H) 80.0 - 100.0 fL   MCH 34.0 26.0 - 34.0 pg   MCHC 33.6 32.0 - 36.0 g/dL   RDW 16.5 (H) 11.5 - 14.5 %   Platelets 85 (L) 150 - 440 K/uL    Comment: Performed at [facility], [address], [place], [place] [address]     Assessment:  77 y.o. chronic liver disease, admitted for AKI, also with large vaginal prolapse   Plan: 1) AKI: being managed by medicine and nephrology.  Likely secondary to underlying liver disease and hypoalbunemia/low oncotic pressure and decreased renal  perfusion.  2) Prolapse: has remained reduced overnight with pessary placed yesterday.  Patient remains at high risk for expulsion particularly once she ambulated more.  Surgical management via colpocleisis but her liver disease and AKI currently preclude her from being a surgical candidate.   - continue vaginal premarin  Malachy Mood 11/21/2017, 9:34 AM

## 2017-11-21 NOTE — Care Management (Addendum)
[facility] to follow patient in their palliative program only at dc. [name] with Hospice aware.

## 2017-11-21 NOTE — Progress Notes (Signed)
This patient came in with acute renal failure and ascites.  The patient's liver disease has been chronic and she has been followed both by [facility] and Dr. Mechele CollinElliott in the past.  The patient is being followed by nephrology at this time.  The patient's main issue at this time is her kidney function.  Nothing further to add from a GI point of view decides avoiding diuresis due to the patient's kidney issues.  I will sign off.  Please call if any further GI concerns or questions.  We would like to thank you for the opportunity to participate in the care of Susan Greer.

## 2017-11-21 NOTE — Progress Notes (Signed)
[facility]  ROUNDING NOTE   Subjective:   Family at bedside.   Pessary placement yesterday by OB/GYN.   Started on diuretics today.     Objective:  Vital signs in last 24 hours:  Temp:  [98.1 F (36.7 C)-98.5 F (36.9 C)] 98.5 F (36.9 C) (02/01 0430) Pulse Rate:  [67-74] 74 (02/01 1356) Resp:  [12-18] 18 (02/01 1356) BP: (107-121)/(45-61) 107/45 (02/01 1356) SpO2:  [96 %-99 %] 96 % (02/01 1329) Weight:  [51.9 kg (114 lb 6.7 oz)-54 kg (119 lb 0.8 oz)] 51.9 kg (114 lb 6.7 oz) (02/01 0725)  Weight change: 0.022 kg (0.8 oz) Filed Weights   11/20/17 0500 11/21/17 0500 11/21/17 0725  Weight: 54 kg (119 lb) 54 kg (119 lb 0.8 oz) 51.9 kg (114 lb 6.7 oz)    Intake/Output: I/O last 3 completed shifts: In: 300 [IV Piggyback:300] Out: 1150 [Urine:1150]   Intake/Output this shift:  Total I/O In: 360 [P.O.:360] Out: -   Physical Exam: General: NAD, laying in bed  Head: Normocephalic, atraumatic. Moist oral mucosal membranes  Eyes: Anicteric, PERRL  Neck: Supple, trachea midline  Lungs:  Clear to auscultation  Heart: Regular rate and rhythm  Abdomen:  +ascites  Extremities: + peripheral edema.  Neurologic: Nonfocal, moving all four extremities  Skin: No lesions        Basic Metabolic Panel: Recent Labs  Lab 11/17/17 1710 11/18/17 0614 11/19/17 0730 11/20/17 0412 11/21/17 0603  NA 136 135 135 140 139  K 4.9 4.5 4.4 4.7 4.6  CL 103 107 106 108 106  CO2 24 22 21* 20* 22  GLUCOSE 138* 102* 106* 110* 106*  BUN 85* 85* 85* 87* 86*  CREATININE 4.41* 4.31* 4.07* 4.30* 4.28*  CALCIUM 8.3* 8.0* 8.3* 8.4* 8.9    Liver Function Tests: Recent Labs  Lab 11/17/17 1710 11/19/17 0730 11/20/17 0412 11/21/17 0603  AST 43* 30 27 24   ALT 20 14 12* 11*  ALKPHOS 174* 135* 125 84  BILITOT 1.7* 2.0* 1.9* 2.4*  PROT 7.3 6.2* 6.1* 6.8  ALBUMIN 2.1* 2.9* 3.2* 4.4   No results for input(s): LIPASE, AMYLASE in the last 168 hours. No results for input(s):  AMMONIA in the last 168 hours.  CBC: Recent Labs  Lab 11/17/17 1710 11/18/17 0614 11/19/17 0730 11/21/17 0603  WBC 9.9 8.7 6.9 8.7  NEUTROABS 7.3*  --   --   --   HGB 13.0 11.9* 10.9* 10.5*  HCT 38.1 35.4 31.9* 31.4*  MCV 100.7* 100.8* 100.6* 101.2*  PLT 141* 130* 79* 85*    Cardiac Enzymes: No results for input(s): CKTOTAL, CKMB, CKMBINDEX, TROPONINI in the last 168 hours.  BNP: Invalid input(s): POCBNP  CBG: No results for input(s): GLUCAP in the last 168 hours.  Microbiology: Results for orders placed or performed during the hospital encounter of 11/17/17  Body fluid culture     Status: None (Preliminary result)   Collection Time: 11/18/17 12:00 PM  Result Value Ref Range Status   Specimen Description   Final    PERITONEAL Performed at [facility], [address] [place], [place] [address]    Special Requests   Final    NONE Performed at [facility], [address] [place], [place] [address]    Gram Stain NO WBC SEEN NO ORGANISMS SEEN   Final   Culture   Final    NO GROWTH 3 DAYS Performed at [facility], [address], [place], [place] [address]  Report Status PENDING  Incomplete    Coagulation Studies: No results for input(s): LABPROT, INR in the last 72 hours.  Urinalysis: No results for input(s): COLORURINE, LABSPEC, PHURINE, GLUCOSEU, HGBUR, BILIRUBINUR, KETONESUR, PROTEINUR, UROBILINOGEN, NITRITE, LEUKOCYTESUR in the last 72 hours.  Invalid input(s): APPERANCEUR    Imaging: No results found.   Medications:    . conjugated estrogens  1 Applicatorful Vaginal Daily  . docusate  100 mg Oral BID  . furosemide  40 mg Oral Daily  . metoprolol  200 mg Oral Daily  . pantoprazole  40 mg Oral Daily  . pneumococcal 23 valent vaccine  0.5 mL Intramuscular Tomorrow-1000  . sertraline  50 mg Oral QHS  . spironolactone  25 mg Oral Daily   acetaminophen **OR** acetaminophen, albuterol, clorazepate, diphenhydrAMINE,  HYDROcodone-acetaminophen, morphine injection, ondansetron **OR** ondansetron (ZOFRAN) IV, polyethylene glycol, polyvinyl alcohol  Assessment/ Plan:  Susan Greer is a 77 y.o. white female with hypertension, liver cirrhosis possibly secondary to fatty liver disease, SVT, history of uterine fibroids, depression, and vaginal prolapse   1. Acute renal failure: baseline creatinine of 1.04 with GFR of 49 on 07/29/2017.  Acute renal failure secondary to either over diuresis versus acute hepatorenal syndrome - failed outpatient diuretic therapy - Watch for improvement status post large volume paracentesis. 5 liters removed on 1/29.  - low threshold for dialysis. No acute indication for dialysis.  - Restarted furosemide and spironolactone.  - Appreciate palliative care input.    2. Anasarca/ascites: patient with volume overload and hypoalbuminemia - paracentesis 1/29: removed 5 liters.  - Completed 3 days of IV albumin - diuretics as above.    LOS: 4 Stevon Gough 2/1/20192:53 PM

## 2017-11-21 NOTE — Progress Notes (Addendum)
Pt was up to bathroom with daughter in law, pt started to have a BM, pessary fell out onto floor, uterus fell out,  some bleeding noted and quarter sized abrasions x2 noted to upper uterus and very large abrasion type area noted on bottom of uterus, pt also stumbled causing a skin tear to the left arm, MD Dr Bonney AidStaebler made aware of all the above, states that he will see pt tomorrow in the mean time, follow orders for premarin cream to uterus

## 2017-11-21 NOTE — Progress Notes (Signed)
New referral for out patient PALLIATIVE to follow [facility] Dimit[facility]isa [facility]. Plan is for discharge home today. Patient information faxed to referral.  Dayna BarkerKaren robertson RN, BSN, [facility], hospital liaison [phone]

## 2017-11-22 LAB — COMPREHENSIVE METABOLIC PANEL
ALK PHOS: 128 U/L — AB (ref 38–126)
ALT: 12 U/L — AB (ref 14–54)
AST: 27 U/L (ref 15–41)
Albumin: 3.5 g/dL (ref 3.5–5.0)
Anion gap: 11 (ref 5–15)
BUN: 89 mg/dL — ABNORMAL HIGH (ref 6–20)
CALCIUM: 8.6 mg/dL — AB (ref 8.9–10.3)
CO2: 22 mmol/L (ref 22–32)
CREATININE: 4.59 mg/dL — AB (ref 0.44–1.00)
Chloride: 106 mmol/L (ref 101–111)
GFR, EST AFRICAN AMERICAN: 10 mL/min — AB (ref >60)
GFR, EST NON AFRICAN AMERICAN: 9 mL/min — AB (ref >60)
Glucose, Bld: 93 mg/dL (ref 65–99)
Potassium: 4.7 mmol/L (ref 3.5–5.1)
Sodium: 139 mmol/L (ref 135–145)
Total Bilirubin: 2.2 mg/dL — ABNORMAL HIGH (ref 0.3–1.2)
Total Protein: 6.1 g/dL — ABNORMAL LOW (ref 6.5–8.1)

## 2017-11-22 LAB — BODY FLUID CULTURE
Culture: NO GROWTH
GRAM STAIN: NONE SEEN

## 2017-11-22 MED ORDER — ALUM & MAG HYDROXIDE-SIMETH 200-200-20 MG/5ML PO SUSP
30.0000 mL | Freq: Four times a day (QID) | ORAL | Status: DC | PRN
  Administered 2017-11-22: 30 mL via ORAL
  Filled 2017-11-22: qty 30

## 2017-11-22 NOTE — Progress Notes (Signed)
Patient ID: Tamera PuntJudy I Greer, female   DOB: 1941/02/27, 77 y.o.   MRN: [id number]  ACP note  Family at the bedside and patient.  Diagnosis acute kidney injury with poor GFR, cirrhosis with ascites, vaginal prolapse with cystocele and enterocele.  Patient does not a surgical candidate to fix the prolapse.  Patient does not want dialysis.  Patient will likely have refractory ascites and will need interventional radiology to do a tap on a biweekly basis.  Discussed hospice versus home health.  Family wanted to speak more with the care manager about these choices.  Family also concerned that the patient has not walked yet and the pessary keeps on falling out.  If the pessary keeps on falling out she may end up repeatedly come to the hospital for this to be replaced.  This can irritate the bowel and potentially perforate.  That would be fatal.  Time spent on ACP discussion 17 minutes  Dr. Alford Highlandichard Patsy Varma

## 2017-11-22 NOTE — Progress Notes (Signed)
Progress note  Susan Greer is an 77 y.o. female.  Subjective: Patient frustrated with pessary falling out yesterday. She requested that it be replaced today. Discussed with patient and family that because of the extent of her ascites that the pessary will continue to fall out. Explained that the vaginal mucosa is very thin and that it needs to be well cared for with application of estrogen cream once a day and Vaseline 2 times a day. Patient is at risk for the vaginal mucosa breaking down and bowel evisceration. Looked for a large Gellhorn pessary in office, but there was not one there. Foley was removed yesterday and patient was very uncomfortable with having to get up frequently to go to the bathroom. However, she also complained that the foley was uncomfortable. Pilar Plate conversation held with patient and family about end of life planning.     Results for orders placed or performed during the hospital encounter of 11/17/17 (from the past 48 hour(s))  Comprehensive metabolic panel     Status: Abnormal   Collection Time: 11/21/17  6:03 AM  Result Value Ref Range   Sodium 139 135 - 145 mmol/L   Potassium 4.6 3.5 - 5.1 mmol/L   Chloride 106 101 - 111 mmol/L   CO2 22 22 - 32 mmol/L   Glucose, Bld 106 (H) 65 - 99 mg/dL   BUN 86 (H) 6 - 20 mg/dL   Creatinine, Ser 4.28 (H) 0.44 - 1.00 mg/dL   Calcium 8.9 8.9 - 10.3 mg/dL   Total Protein 6.8 6.5 - 8.1 g/dL   Albumin 4.4 3.5 - 5.0 g/dL   AST 24 15 - 41 U/L   ALT 11 (L) 14 - 54 U/L   Alkaline Phosphatase 84 38 - 126 U/L   Total Bilirubin 2.4 (H) 0.3 - 1.2 mg/dL   GFR calc non Af Amer 9 (L) >60 mL/min   GFR calc Af Amer 11 (L) >60 mL/min    Comment: (NOTE) The eGFR has been calculated using the CKD EPI equation. This calculation has not been validated in all clinical situations. eGFR's persistently <60 mL/min signify possible Chronic Kidney Disease.    Anion gap 11 5 - 15    Comment: Performed at [facility], [address]  [place], [place] [address]  CBC     Status: Abnormal   Collection Time: 11/21/17  6:03 AM  Result Value Ref Range   WBC 8.7 3.6 - 11.0 K/uL   RBC 3.10 (L) 3.80 - 5.20 MIL/uL   Hemoglobin 10.5 (L) 12.0 - 16.0 g/dL   HCT 31.4 (L) 35.0 - 47.0 %   MCV 101.2 (H) 80.0 - 100.0 fL   MCH 34.0 26.0 - 34.0 pg   MCHC 33.6 32.0 - 36.0 g/dL   RDW 16.5 (H) 11.5 - 14.5 %   Platelets 85 (L) 150 - 440 K/uL    Comment: Performed at [facility], [address] [place],  [address]  Comprehensive metabolic panel     Status: Abnormal   Collection Time: 11/22/17  5:16 AM  Result Value Ref Range   Sodium 139 135 - 145 mmol/L   Potassium 4.7 3.5 - 5.1 mmol/L   Chloride 106 101 - 111 mmol/L   CO2 22 22 - 32 mmol/L   Glucose, Bld 93 65 - 99 mg/dL   BUN 89 (H) 6 - 20 mg/dL   Creatinine, Ser 4.59 (H) 0.44 - 1.00 mg/dL   Calcium 8.6 (L) 8.9 - 10.3  mg/dL   Total Protein 6.1 (L) 6.5 - 8.1 g/dL   Albumin 3.5 3.5 - 5.0 g/dL   AST 27 15 - 41 U/L   ALT 12 (L) 14 - 54 U/L   Alkaline Phosphatase 128 (H) 38 - 126 U/L   Total Bilirubin 2.2 (H) 0.3 - 1.2 mg/dL   GFR calc non Af Amer 9 (L) >60 mL/min   GFR calc Af Amer 10 (L) >60 mL/min    Comment: (NOTE) The eGFR has been calculated using the CKD EPI equation. This calculation has not been validated in all clinical situations. eGFR's persistently <60 mL/min signify possible Chronic Kidney Disease.    Anion gap 11 5 - 15    Comment: Performed at [facility], [address] [place], [place] [address]    No results found.  Review of Systems  Constitutional: Negative for chills, fever, malaise/fatigue and weight loss.  HENT: Negative for congestion, hearing loss and sinus pain.   Eyes: Negative for blurred vision and double vision.  Respiratory: Negative for cough, sputum production, shortness of breath and wheezing.   Cardiovascular: Negative for chest pain, palpitations, orthopnea and leg swelling.  Gastrointestinal: Negative for  abdominal pain, constipation, diarrhea, nausea and vomiting.  Genitourinary: Negative for dysuria, flank pain, frequency, hematuria and urgency.  Musculoskeletal: Negative for back pain, falls and joint pain.  Skin: Negative for itching and rash.  Neurological: Negative for dizziness and headaches.  Psychiatric/Behavioral: Negative for depression, substance abuse and suicidal ideas. The patient is not nervous/anxious.    Blood pressure (!) 117/55, pulse 76, temperature 98.1 F (36.7 C), temperature source Oral, resp. rate 16, height _0  (1.6 m), weight 121 lb 4.1 oz (55 kg), SpO2 96 %. Physical Exam  Constitutional: She is oriented to person, place, and time. No distress.  HENT:  Head: Normocephalic and atraumatic.  Neck: Neck supple.  Cardiovascular: Normal rate and regular rhythm.  Respiratory: Effort normal.  GI: She exhibits distension. She exhibits no mass. There is no tenderness. There is no rebound and no guarding.  ascities reduced, but still present  Genitourinary:  Genitourinary Comments: Large prolapse  Neurological: She is alert and oriented to person, place, and time.  Skin: Skin is warm and dry.  Psychiatric: She has a normal mood and affect. Her behavior is normal. Judgment and thought content normal.    Assessment/Plan: Pessary replaced, gellhorn pessary not available in office. Very unlikely that any pessary will be successful for patient. Consider discharge home with foley catheter. Please call with any further questions or concerns.     Lorey Pallett R Seniyah Esker 11/22/2017, 11:16 PM

## 2017-11-22 NOTE — Progress Notes (Signed)
Patient ID: Susan Greer, female   DOB: [birth year], 77 y.o.   MRN: [id number]  [employer] [facility] PROGRESS NOTE  RICA HEATHER JXB:[id number] DOB: 1940/12/29 DOA: 11/17/2017 PCP: Allegra Grana, FNP  HPI/Subjective: Patient physically feels okay.  Has not walked since being here.  Feels okay when the pessary is in and holding things in.  Objective: Vitals:   11/22/17 0805 11/22/17 1313  BP: 113/71 (!) 126/55  Pulse: 66 66  Resp: 16 12  Temp: 97.8 F (36.6 C) 97.9 F (36.6 C)  SpO2: 99% 96%    Filed Weights   11/21/17 0500 11/21/17 0725 11/22/17 0500  Weight: 54 kg (119 lb 0.8 oz) 51.9 kg (114 lb 6.7 oz) 55 kg (121 lb 4.1 oz)    ROS: Review of Systems  Constitutional: Negative for chills and fever.  Eyes: Negative for blurred vision.  Respiratory: Negative for cough and shortness of breath.   Cardiovascular: Negative for chest pain.  Gastrointestinal: Negative for abdominal pain, constipation, diarrhea, nausea and vomiting.  Genitourinary: Negative for dysuria.  Musculoskeletal: Negative for joint pain.  Neurological: Negative for dizziness and headaches.   Exam: Physical Exam  Constitutional: She is oriented to person, place, and time.  HENT:  Nose: No mucosal edema.  Mouth/Throat: No oropharyngeal exudate or posterior oropharyngeal edema.  Eyes: Conjunctivae, EOM and lids are normal. Pupils are equal, round, and reactive to light.  Neck: No JVD present. Carotid bruit is not present. No edema present. No thyroid mass and no thyromegaly present.  Cardiovascular: S1 normal and S2 normal. Exam reveals no gallop.  No murmur heard. Pulses:      Dorsalis pedis pulses are 2+ on the right side, and 2+ on the left side.  Respiratory: No respiratory distress. She has no wheezes. She has no rhonchi. She has no rales.  GI: Soft. Bowel sounds are normal. She exhibits distension. There is no tenderness.  Musculoskeletal:       Right ankle: She exhibits no swelling.       Left  ankle: She exhibits no swelling.  Lymphadenopathy:    She has no cervical adenopathy.  Neurological: She is alert and oriented to person, place, and time. No cranial nerve deficit.  Skin: Skin is warm. No rash noted. Nails show no clubbing.  Psychiatric: She has a normal mood and affect.      Data Reviewed: Basic Metabolic Panel: Recent Labs  Lab 11/18/17 0614 11/19/17 0730 11/20/17 0412 11/21/17 0603 11/22/17 0516  NA 135 135 140 139 139  K 4.5 4.4 4.7 4.6 4.7  CL 107 106 108 106 106  CO2 22 21* 20* 22 22  GLUCOSE 102* 106* 110* 106* 93  BUN 85* 85* 87* 86* 89*  CREATININE 4.31* 4.07* 4.30* 4.28* 4.59*  CALCIUM 8.0* 8.3* 8.4* 8.9 8.6*   Liver Function Tests: Recent Labs  Lab 11/17/17 1710 11/19/17 0730 11/20/17 0412 11/21/17 0603 11/22/17 0516  AST 43* 30 27 24 27   ALT 20 14 12* 11* 12*  ALKPHOS 174* 135* 125 84 128*  BILITOT 1.7* 2.0* 1.9* 2.4* 2.2*  PROT 7.3 6.2* 6.1* 6.8 6.1*  ALBUMIN 2.1* 2.9* 3.2* 4.4 3.5   CBC: Recent Labs  Lab 11/17/17 1710 11/18/17 0614 11/19/17 0730 11/21/17 0603  WBC 9.9 8.7 6.9 8.7  NEUTROABS 7.3*  --   --   --   HGB 13.0 11.9* 10.9* 10.5*  HCT 38.1 35.4 31.9* 31.4*  MCV 100.7* 100.8* 100.6* 101.2*  PLT 141* 130* 79* 85*  ProBNP (last 3 results) Recent Labs    04/30/17 1358  PROBNP 158.0*      Recent Results (from the past 240 hour(s))  Body fluid culture     Status: None   Collection Time: 11/18/17 12:00 PM  Result Value Ref Range Status   Specimen Description   Final    PERITONEAL Performed at [facility], [address] [place], [place] [address]    Special Requests   Final    NONE Performed at [facility], [address] [place], [place] [address]    Gram Stain NO WBC SEEN NO ORGANISMS SEEN   Final   Culture   Final    No growth aerobically or anaerobically. Performed at [facility], [address] [place], [place] [address]    Report Status 11/22/2017 FINAL  Final       Scheduled Meds: . conjugated estrogens  1 Applicatorful Vaginal Daily  . docusate sodium  100 mg Oral BID  . metoprolol  200 mg Oral Daily  . pantoprazole  40 mg Oral Daily  . pneumococcal 23 valent vaccine  0.5 mL Intramuscular Tomorrow-1000  . sertraline  50 mg Oral QHS   Continuous Infusions:  Assessment/Plan:  1. Liver cirrhosis with ascites.  Patient will likely need outpatient paracentesis on a biweekly basis. 2. Acute kidney injury.  As per nephrology hold off on diuretics at this time.  Foley catheter placed.  Likely obstructive in nature with her vaginal prolapse.  GFR in dialysis range.  Patient does not want this. 3. Prolapsed vaginal, cystocele rectocele and enterocele.  Pessary replaced again by gynecology 4. Hypertension on metoprolol 5. Weakness.  Has not been well enough to walk with physical therapy so far. 6. Depression on Zoloft  Code Status:     Code Status Orders  (From admission, onward)        Start     Ordered   11/17/17 1654  Full code  Continuous     11/17/17 1654    Code Status History    Date Active Date Inactive Code Status Order ID Comments User Context   This patient has a current code status but no historical code status.    Advance Directive Documentation     Most Recent Value  Type of Advance Directive  Living will  Pre-existing out of facility DNR order (yellow form or pink MOST form)  No data  "MOST" Form in Place?  No data     Family Communication: Spoke with numerous family members in and outside the room Disposition Plan: To be determined  Time spent: 40 minutes including ACP time  Loews Corporationichard Apolonio Cutting  [facility]

## 2017-11-22 NOTE — Progress Notes (Signed)
[facility]  ROUNDING NOTE   Subjective:   Family at bedside.   Prolapse again - GYN placed a new pessary.   Creatinine 4.59 (4.28)  Objective:  Vital signs in last 24 hours:  Temp:  [97.8 F (36.6 C)-99 F (37.2 C)] 97.8 F (36.6 C) (02/02 0805) Pulse Rate:  [57-77] 66 (02/02 0805) Resp:  [12-18] 16 (02/02 0805) BP: (106-113)/(44-71) 113/71 (02/02 0805) SpO2:  [96 %-99 %] 99 % (02/02 0805) Weight:  [55 kg (121 lb 4.1 oz)] 55 kg (121 lb 4.1 oz) (02/02 0500)  Weight change: -2.1 kg (-10.1 oz) Filed Weights   11/21/17 0500 11/21/17 0725 11/22/17 0500  Weight: 54 kg (119 lb 0.8 oz) 51.9 kg (114 lb 6.7 oz) 55 kg (121 lb 4.1 oz)    Intake/Output: I/O last 3 completed shifts: In: 480 [P.O.:480] Out: 450 [Urine:450]   Intake/Output this shift:  Total I/O In: 240 [P.O.:240] Out: -   Physical Exam: General: NAD, laying in bed  Head: Normocephalic, atraumatic. Moist oral mucosal membranes  Eyes: Anicteric, PERRL  Neck: Supple, trachea midline  Lungs:  Clear to auscultation  Heart: Regular rate and rhythm  Abdomen:  +ascites  Extremities: + peripheral edema.  Neurologic: Nonfocal, moving all four extremities  Skin: No lesions        Basic Metabolic Panel: Recent Labs  Lab 11/18/17 0614 11/19/17 0730 11/20/17 0412 11/21/17 0603 11/22/17 0516  NA 135 135 140 139 139  K 4.5 4.4 4.7 4.6 4.7  CL 107 106 108 106 106  CO2 22 21* 20* 22 22  GLUCOSE 102* 106* 110* 106* 93  BUN 85* 85* 87* 86* 89*  CREATININE 4.31* 4.07* 4.30* 4.28* 4.59*  CALCIUM 8.0* 8.3* 8.4* 8.9 8.6*    Liver Function Tests: Recent Labs  Lab 11/17/17 1710 11/19/17 0730 11/20/17 0412 11/21/17 0603 11/22/17 0516  AST 43* 30 27 24 27   ALT 20 14 12* 11* 12*  ALKPHOS 174* 135* 125 84 128*  BILITOT 1.7* 2.0* 1.9* 2.4* 2.2*  PROT 7.3 6.2* 6.1* 6.8 6.1*  ALBUMIN 2.1* 2.9* 3.2* 4.4 3.5   No results for input(s): LIPASE, AMYLASE in the last 168 hours. No results for input(s):  AMMONIA in the last 168 hours.  CBC: Recent Labs  Lab 11/17/17 1710 11/18/17 0614 11/19/17 0730 11/21/17 0603  WBC 9.9 8.7 6.9 8.7  NEUTROABS 7.3*  --   --   --   HGB 13.0 11.9* 10.9* 10.5*  HCT 38.1 35.4 31.9* 31.4*  MCV 100.7* 100.8* 100.6* 101.2*  PLT 141* 130* 79* 85*    Cardiac Enzymes: No results for input(s): CKTOTAL, CKMB, CKMBINDEX, TROPONINI in the last 168 hours.  BNP: Invalid input(s): POCBNP  CBG: No results for input(s): GLUCAP in the last 168 hours.  Microbiology: Results for orders placed or performed during the hospital encounter of 11/17/17  Body fluid culture     Status: None   Collection Time: 11/18/17 12:00 PM  Result Value Ref Range Status   Specimen Description   Final    PERITONEAL Performed at [facility], [address] [place], [place] [address]    Special Requests   Final    NONE Performed at [facility], [address] [place], [place] [address]    Gram Stain NO WBC SEEN NO ORGANISMS SEEN   Final   Culture   Final    No growth aerobically or anaerobically. Performed at [facility], [address] [place] [address]    Report Status 11/22/2017 FINAL  Final    Coagulation Studies: No results for input(s): LABPROT, INR in the last 72 hours.  Urinalysis: No results for input(s): COLORURINE, LABSPEC, PHURINE, GLUCOSEU, HGBUR, BILIRUBINUR, KETONESUR, PROTEINUR, UROBILINOGEN, NITRITE, LEUKOCYTESUR in the last 72 hours.  Invalid input(s): APPERANCEUR    Imaging: No results found.   Medications:    . conjugated estrogens  1 Applicatorful Vaginal Daily  . docusate sodium  100 mg Oral BID  . furosemide  40 mg Oral Daily  . metoprolol  200 mg Oral Daily  . pantoprazole  40 mg Oral Daily  . pneumococcal 23 valent vaccine  0.5 mL Intramuscular Tomorrow-1000  . sertraline  50 mg Oral QHS  . spironolactone  25 mg Oral Daily   acetaminophen **OR** acetaminophen, albuterol, clorazepate,  diphenhydrAMINE, HYDROcodone-acetaminophen, morphine injection, ondansetron **OR** ondansetron (ZOFRAN) IV, polyethylene glycol, polyvinyl alcohol  Assessment/ Plan:  [name] is a 77 y.o. white female with hypertension, liver cirrhosis possibly secondary to fatty liver disease, SVT, history of uterine fibroids, depression, and vaginal prolapse   1. Acute renal failure: baseline creatinine of 1.04 with GFR of 49 on 07/29/2017.  Acute renal failure secondary to either over diuresis versus acute hepatorenal syndrome - failed outpatient diuretic therapy. Hold diuretics for now.  - Watch for improvement status post large volume paracentesis. 5 liters removed on 1/29.  - low threshold for dialysis. No acute indication for dialysis. Patient is not interested on hemodialysis.  - Place Foley Catheter.  - Appreciate palliative care input.    2. Anasarca/ascites: patient with volume overload and hypoalbuminemia - paracentesis 1/29: removed 5 liters.  - Completed 3 days of IV albumin - diuretics being held.   Will need outpatient follow up.    LOS: 5 Susan Greer 2/2/201912:32 PM

## 2017-11-22 NOTE — Care Management Note (Signed)
Case Management Note  Patient Details  Name: Tamera PuntJudy I Trethewey MRN: [id number] Date of Birth: [birth year]  Subjective/Objective:       Discussed home health services and hospice services with family. They report that they will discuss options among themselves and will advise Dr Renae GlossWieting on Sunday orf their decision. Ms Lynne LeaderMoize has discontinued dialysis.              Action/Plan:   Expected Discharge Date:  11/21/17               Expected Discharge Plan:     In-House Referral:     Discharge planning Services     Post Acute Care Choice:    Choice offered to:     DME Arranged:    DME Agency:     HH Arranged:    HH Agency:     Status of Service:     If discussed at [employer] Meetings, dates discussed:    Additional Comments:  Masao Junker A, RN 11/22/2017, 4:32 PM

## 2017-11-22 NOTE — Plan of Care (Signed)
  Education: Knowledge of General Education information will improve 11/22/2017 0329 - Progressing by Geri SeminoleJackson, Krystine Pabst A, RN   Health Behavior/Discharge Planning: Ability to manage health-related needs will improve 11/22/2017 0329 - Progressing by Geri SeminoleJackson, Mischele Detter A, RN   Clinical Measurements: Ability to maintain clinical measurements within normal limits will improve 11/22/2017 0329 - Progressing by Geri SeminoleJackson, Zyia Kaneko A, RN Will remain free from infection 11/22/2017 0329 - Progressing by Geri SeminoleJackson, Howie Rufus A, RN Diagnostic test results will improve 11/22/2017 0329 - Progressing by Geri SeminoleJackson, Amontae Ng A, RN Respiratory complications will improve 11/22/2017 0329 - Progressing by Geri SeminoleJackson, Teosha Casso A, RN Cardiovascular complication will be avoided 11/22/2017 0329 - Progressing by Geri SeminoleJackson, Feliciano Wynter A, RN   Activity: Risk for activity intolerance will decrease 11/22/2017 0329 - Progressing by Geri SeminoleJackson, Winefred Hillesheim A, RN   Nutrition: Adequate nutrition will be maintained 11/22/2017 0329 - Progressing by Geri SeminoleJackson, Sammi Stolarz A, RN   Coping: Level of anxiety will decrease 11/22/2017 0329 - Progressing by Geri SeminoleJackson, Jefferey Lippmann A, RN   Elimination: Will not experience complications related to bowel motility 11/22/2017 0329 - Progressing by Geri SeminoleJackson, Jolea Dolle A, RN Will not experience complications related to urinary retention 11/22/2017 0329 - Progressing by Geri SeminoleJackson, Emmory Solivan A, RN   Pain Managment: General experience of comfort will improve 11/22/2017 0329 - Progressing by Geri SeminoleJackson, Ladarrion Telfair A, RN   Safety: Ability to remain free from injury will improve 11/22/2017 0329 - Progressing by Geri SeminoleJackson, Peng Thorstenson A, RN   Skin Integrity: Risk for impaired skin integrity will decrease 11/22/2017 0329 - Progressing by Geri SeminoleJackson, Lewanna Petrak A, RN

## 2017-11-22 NOTE — Progress Notes (Signed)
VSS. Pt uterus replaced by OB this shift. Pt c/o indigestion- Maalox ordered and given. 3914fr Foley placed @ 12. Tolerated well. No falls this shift. Family visiting all day.

## 2017-11-23 LAB — COMPREHENSIVE METABOLIC PANEL
ALBUMIN: 3.3 g/dL — AB (ref 3.5–5.0)
ALK PHOS: 141 U/L — AB (ref 38–126)
ALT: 14 U/L (ref 14–54)
ANION GAP: 12 (ref 5–15)
AST: 31 U/L (ref 15–41)
BUN: 95 mg/dL — ABNORMAL HIGH (ref 6–20)
CALCIUM: 8.7 mg/dL — AB (ref 8.9–10.3)
CHLORIDE: 105 mmol/L (ref 101–111)
CO2: 20 mmol/L — AB (ref 22–32)
CREATININE: 4.59 mg/dL — AB (ref 0.44–1.00)
GFR calc Af Amer: 10 mL/min — ABNORMAL LOW (ref >60)
GFR calc non Af Amer: 9 mL/min — ABNORMAL LOW (ref >60)
GLUCOSE: 95 mg/dL (ref 65–99)
Potassium: 4.7 mmol/L (ref 3.5–5.1)
SODIUM: 137 mmol/L (ref 135–145)
Total Bilirubin: 2.8 mg/dL — ABNORMAL HIGH (ref 0.3–1.2)
Total Protein: 6.4 g/dL — ABNORMAL LOW (ref 6.5–8.1)

## 2017-11-23 MED ORDER — ALBUMIN HUMAN 25 % IV SOLN
25.0000 g | Freq: Once | INTRAVENOUS | Status: AC
  Administered 2017-11-24: 25 g via INTRAVENOUS
  Filled 2017-11-23: qty 100

## 2017-11-23 MED ORDER — WITCH HAZEL-GLYCERIN EX PADS
MEDICATED_PAD | CUTANEOUS | Status: DC | PRN
Start: 2017-11-23 — End: 2017-11-24
  Administered 2017-11-23: 16:00:00 via TOPICAL
  Filled 2017-11-23: qty 100

## 2017-11-23 MED ORDER — WITCH HAZEL-GLYCERIN EX PADS: MEDICATED_PAD | CUTANEOUS | Status: DC | PRN

## 2017-11-23 MED ORDER — HYDROCORTISONE 0.5 % EX CREA
TOPICAL_CREAM | Freq: Three times a day (TID) | CUTANEOUS | Status: DC
  Administered 2017-11-23 – 2017-11-24 (×3): via TOPICAL
  Filled 2017-11-23: qty 28.35

## 2017-11-23 MED ORDER — ALBUMIN HUMAN 25 % IV SOLN: 12.5000 g | Freq: Once | INTRAVENOUS | Status: DC

## 2017-11-23 NOTE — Progress Notes (Signed)
Patient ID: [name], female   DOB: [birth year], 77 y.o.   MRN: [id number]  [employer] PROGRESS NOTE  Susan Greer WUJ:[id number] DOB: 03-31-1941 DOA: 11/17/2017 PCP: Allegra Grana, FNP  HPI/Subjective: Patient had some nausea this morning.  Blood pressure low and her high dose Toprol was held.  Diuretics stopped.  Objective: Vitals:   11/23/17 0906 11/23/17 1259  BP: (!) 102/50 (!) 114/54  Pulse: 70 70  Resp: 16   Temp: 97.8 F (36.6 C) 97.9 F (36.6 C)  SpO2: 97% 99%    Filed Weights   11/21/17 0725 11/22/17 0500 11/23/17 0500  Weight: 51.9 kg (114 lb 6.7 oz) 55 kg (121 lb 4.1 oz) 55.8 kg (123 lb 0.3 oz)    ROS: Review of Systems  Constitutional: Negative for chills and fever.  Eyes: Negative for blurred vision.  Respiratory: Negative for cough and shortness of breath.   Cardiovascular: Negative for chest pain.  Gastrointestinal: Positive for nausea. Negative for abdominal pain, constipation, diarrhea and vomiting.  Genitourinary: Negative for dysuria.  Musculoskeletal: Negative for joint pain.  Neurological: Positive for weakness. Negative for dizziness and headaches.   Exam: Physical Exam  Constitutional: She is oriented to person, place, and time.  HENT:  Nose: No mucosal edema.  Mouth/Throat: No oropharyngeal exudate or posterior oropharyngeal edema.  Eyes: Conjunctivae, EOM and lids are normal. Pupils are equal, round, and reactive to light.  Neck: No JVD present. Carotid bruit is not present. No edema present. No thyroid mass and no thyromegaly present.  Cardiovascular: S1 normal and S2 normal. Exam reveals no gallop.  No murmur heard. Pulses:      Dorsalis pedis pulses are 2+ on the right side, and 2+ on the left side.  Respiratory: No respiratory distress. She has no wheezes. She has no rhonchi. She has no rales.  GI: Soft. Bowel sounds are normal. She exhibits distension. There is no tenderness.  Musculoskeletal:       Right ankle: She  exhibits no swelling.       Left ankle: She exhibits no swelling.  Lymphadenopathy:    She has no cervical adenopathy.  Neurological: She is alert and oriented to person, place, and time. No cranial nerve deficit.  Skin: Skin is warm. No rash noted. Nails show no clubbing.  Psychiatric: She has a normal mood and affect.      Data Reviewed: Basic Metabolic Panel: Recent Labs  Lab 11/19/17 0730 11/20/17 0412 11/21/17 0603 11/22/17 0516 11/23/17 0515  NA 135 140 139 139 137  K 4.4 4.7 4.6 4.7 4.7  CL 106 108 106 106 105  CO2 21* 20* 22 22 20*  GLUCOSE 106* 110* 106* 93 95  BUN 85* 87* 86* 89* 95*  CREATININE 4.07* 4.30* 4.28* 4.59* 4.59*  CALCIUM 8.3* 8.4* 8.9 8.6* 8.7*   Liver Function Tests: Recent Labs  Lab 11/19/17 0730 11/20/17 0412 11/21/17 0603 11/22/17 0516 11/23/17 0515  AST 30 27 24 27 31   ALT 14 12* 11* 12* 14  ALKPHOS 135* 125 84 128* 141*  BILITOT 2.0* 1.9* 2.4* 2.2* 2.8*  PROT 6.2* 6.1* 6.8 6.1* 6.4*  ALBUMIN 2.9* 3.2* 4.4 3.5 3.3*   CBC: Recent Labs  Lab 11/17/17 1710 11/18/17 0614 11/19/17 0730 11/21/17 0603  WBC 9.9 8.7 6.9 8.7  NEUTROABS 7.3*  --   --   --   HGB 13.0 11.9* 10.9* 10.5*  HCT 38.1 35.4 31.9* 31.4*  MCV 100.7* 100.8* 100.6* 101.2*  PLT 141*  130* 79* 85*    ProBNP (last 3 results) Recent Labs    04/30/17 1358  PROBNP 158.0*      Recent Results (from the past 240 hour(s))  Body fluid culture     Status: None   Collection Time: 11/18/17 12:00 PM  Result Value Ref Range Status   Specimen Description   Final    PERITONEAL Performed at [facility], [address] [place], [place] [address]    Special Requests   Final    NONE Performed at [facility], [address] [place], [place] [address]    Gram Stain NO WBC SEEN NO ORGANISMS SEEN   Final   Culture   Final    No growth aerobically or anaerobically. Performed at [facility], [address] [place], [place] [address]     Report Status 11/22/2017 FINAL  Final      Scheduled Meds: . conjugated estrogens  1 Applicatorful Vaginal Daily  . docusate sodium  100 mg Oral BID  . hydrocortisone cream   Topical TID  . pantoprazole  40 mg Oral Daily  . pneumococcal 23 valent vaccine  0.5 mL Intramuscular Tomorrow-1000   Continuous Infusions: . [START ON 11/24/2017] albumin human      Assessment/Plan:  1. Liver cirrhosis with ascites.  Patient will likely need outpatient paracentesis on a biweekly basis.  Family would like to get another paracentesis prior to disposition.  Will order for tomorrow 2. Acute kidney injury.  As per nephrology hold off on diuretics at this time.  Foley catheter placed.  Likely obstructive in nature with her vaginal prolapse.  GFR in dialysis range.  Patient does not want this.  Creatinine about the same at this point 3. Prolapsed vaginal, cystocele rectocele and enterocele.  Pessary replaced again by gynecology.  4. Hypertension on metoprolol 5. Weakness.  Has not been well enough to walk with physical therapy so far. 6. Depression on Zoloft 7. Family to decide on home health with palliative following or hospice care as outpatient  Code Status:     Code Status Orders  (From admission, onward)        Start     Ordered   11/17/17 1654  Full code  Continuous     11/17/17 1654    Code Status History    Date Active Date Inactive Code Status Order ID Comments User Context   This patient has a current code status but no historical code status.    Advance Directive Documentation     Most Recent Value  Type of Advance Directive  Living will  Pre-existing out of facility DNR order (yellow form or pink MOST form)  No data  "MOST" Form in Place?  No data     Family Communication: Spoke with numerous family members in the room Disposition Plan: Potentially home tomorrow  Time spent: 28 minutes  Ymani Porcher [employer]  [employer]

## 2017-11-23 NOTE — Progress Notes (Signed)
[facility]  ROUNDING NOTE   Subjective:   Family at bedside.   Creatinine 4.59   Foley catheter placed. Pessary still intact.   Objective:  Vital signs in last 24 hours:  Temp:  [97.6 F (36.4 C)-98.1 F (36.7 C)] 97.8 F (36.6 C) (02/03 0906) Pulse Rate:  [66-76] 70 (02/03 0906) Resp:  [12-16] 16 (02/03 0906) BP: (101-126)/(50-56) 102/50 (02/03 0906) SpO2:  [96 %-97 %] 97 % (02/03 0906) Weight:  [55.8 kg (123 lb 0.3 oz)] 55.8 kg (123 lb 0.3 oz) (02/03 0500)  Weight change: 3.9 kg (8 lb 9.6 oz) Filed Weights   11/21/17 0725 11/22/17 0500 11/23/17 0500  Weight: 51.9 kg (114 lb 6.7 oz) 55 kg (121 lb 4.1 oz) 55.8 kg (123 lb 0.3 oz)    Intake/Output: I/O last 3 completed shifts: In: 600 [P.O.:600] Out: 800 [Urine:800]   Intake/Output this shift:  No intake/output data recorded.  Physical Exam: General: NAD, laying in bed  Head: Normocephalic, atraumatic. Moist oral mucosal membranes  Eyes: Anicteric, PERRL  Neck: Supple, trachea midline  Lungs:  Clear to auscultation  Heart: Regular rate and rhythm  Abdomen:  +ascites  Extremities: + peripheral edema.  Neurologic: Nonfocal, moving all four extremities  Skin: No lesions        Basic Metabolic Panel: Recent Labs  Lab 11/19/17 0730 11/20/17 0412 11/21/17 0603 11/22/17 0516 11/23/17 0515  NA 135 140 139 139 137  K 4.4 4.7 4.6 4.7 4.7  CL 106 108 106 106 105  CO2 21* 20* 22 22 20*  GLUCOSE 106* 110* 106* 93 95  BUN 85* 87* 86* 89* 95*  CREATININE 4.07* 4.30* 4.28* 4.59* 4.59*  CALCIUM 8.3* 8.4* 8.9 8.6* 8.7*    Liver Function Tests: Recent Labs  Lab 11/19/17 0730 11/20/17 0412 11/21/17 0603 11/22/17 0516 11/23/17 0515  AST 30 27 24 27 31   ALT 14 12* 11* 12* 14  ALKPHOS 135* 125 84 128* 141*  BILITOT 2.0* 1.9* 2.4* 2.2* 2.8*  PROT 6.2* 6.1* 6.8 6.1* 6.4*  ALBUMIN 2.9* 3.2* 4.4 3.5 3.3*   No results for input(s): LIPASE, AMYLASE in the last 168 hours. No results for input(s):  AMMONIA in the last 168 hours.  CBC: Recent Labs  Lab 11/17/17 1710 11/18/17 0614 11/19/17 0730 11/21/17 0603  WBC 9.9 8.7 6.9 8.7  NEUTROABS 7.3*  --   --   --   HGB 13.0 11.9* 10.9* 10.5*  HCT 38.1 35.4 31.9* 31.4*  MCV 100.7* 100.8* 100.6* 101.2*  PLT 141* 130* 79* 85*    Cardiac Enzymes: No results for input(s): CKTOTAL, CKMB, CKMBINDEX, TROPONINI in the last 168 hours.  BNP: Invalid input(s): POCBNP  CBG: No results for input(s): GLUCAP in the last 168 hours.  Microbiology: Results for orders placed or performed during the hospital encounter of 11/17/17  Body fluid culture     Status: None   Collection Time: 11/18/17 12:00 PM  Result Value Ref Range Status   Specimen Description   Final    PERITONEAL Performed at [facility], [address] [place], [place] [address]    Special Requests   Final    NONE Performed at [facility], [address] [place], [place] [address]    Gram Stain NO WBC SEEN NO ORGANISMS SEEN   Final   Culture   Final    No growth aerobically or anaerobically. Performed at [facility], [address] [place], [place] [address]  Report Status 11/22/2017 FINAL  Final    Coagulation Studies: No results for input(s): LABPROT, INR in the last 72 hours.  Urinalysis: No results for input(s): COLORURINE, LABSPEC, PHURINE, GLUCOSEU, HGBUR, BILIRUBINUR, KETONESUR, PROTEINUR, UROBILINOGEN, NITRITE, LEUKOCYTESUR in the last 72 hours.  Invalid input(s): APPERANCEUR    Imaging: No results found.   Medications:   . [START ON 11/24/2017] albumin human     . conjugated estrogens  1 Applicatorful Vaginal Daily  . docusate sodium  100 mg Oral BID  . hydrocortisone cream   Topical TID  . pantoprazole  40 mg Oral Daily  . pneumococcal 23 valent vaccine  0.5 mL Intramuscular Tomorrow-1000   acetaminophen **OR** acetaminophen, albuterol, alum & mag hydroxide-simeth, clorazepate, diphenhydrAMINE,  HYDROcodone-acetaminophen, morphine injection, ondansetron **OR** ondansetron (ZOFRAN) IV, polyethylene glycol, polyvinyl alcohol, witch hazel-glycerin  Assessment/ Plan:  [name] is a 77 y.o. white female with hypertension, liver cirrhosis possibly secondary to fatty liver disease, SVT, history of uterine fibroids, depression, and vaginal prolapse   1. Acute renal failure: baseline creatinine of 1.04 with GFR of 49 on 07/29/2017.  Acute renal failure multifactorial with obstructive uropathy from prolapsed vagina, over- diuresis and acute hepatorenal syndrome - failed outpatient diuretic therapy. Hold diuretics for now.  Status post large volume paracentesis. 5 liters removed on 1/29. Plan on paracentesis for tomorrow. Orders prepared.  - No acute indication for dialysis. Patient is not interested on hemodialysis.  - Placed Foley Catheter.  - Appreciate palliative care input.  Family is interested in home hospice.   2. Anasarca/ascites: patient with volume overload and hypoalbuminemia - paracentesis 1/29: removed 5 liters.  - Completed 3 days of IV albumin - diuretics being held due to acute renal failure and nonresponsive to diuretic therapy.    Will need outpatient follow up. Family prefers follow up in [place].    LOS: 6 Susan Greer 2/3/201912:56 PM

## 2017-11-23 NOTE — Progress Notes (Signed)
Subjective: Interval History: Pessary still in place. Patient is comfortable.  Objective: Vital signs in last 24 hours: Temp:  [97.6 F (36.4 C)-98.1 F (36.7 C)] 97.9 F (36.6 C) (02/03 1259) Pulse Rate:  [68-76] 70 (02/03 1259) Resp:  [16] 16 (02/03 0906) BP: (101-117)/(50-56) 114/54 (02/03 1259) SpO2:  [96 %-99 %] 99 % (02/03 1259) Weight:  [123 lb 0.3 oz (55.8 kg)] 123 lb 0.3 oz (55.8 kg) (02/03 0500)  Intake/Output from previous day: 02/02 0701 - 02/03 0700 In: 480 [P.O.:480] Out: 800 [Urine:800] Intake/Output this shift: No intake/output data recorded.  BP (!) 114/54 (BP Location: Left Arm)   Pulse 70   Temp 97.9 F (36.6 C) (Oral)   Resp 16   Ht 5\' 3"  (1.6 m)   Wt 123 lb 0.3 oz (55.8 kg)   SpO2 99%   BMI 21.79 kg/m  General appearance: alert, cooperative and cachectic Throat: lips, mucosa, and tongue normal; teeth and gums normal Abdomen: abnormal findings:  ascites and distended Extremities: extremities normal, atraumatic, no cyanosis or edema Skin: Skin color, texture, turgor normal. No rashes or lesions  Results for orders placed or performed during the hospital encounter of 11/17/17 (from the past 24 hour(s))  Comprehensive metabolic panel     Status: Abnormal   Collection Time: 11/23/17  5:15 AM  Result Value Ref Range   Sodium 137 135 - 145 mmol/L   Potassium 4.7 3.5 - 5.1 mmol/L   Chloride 105 101 - 111 mmol/L   CO2 20 (L) 22 - 32 mmol/L   Glucose, Bld 95 65 - 99 mg/dL   BUN 95 (H) 6 - 20 mg/dL   Creatinine, Ser 4.094.59 (H) 0.44 - 1.00 mg/dL   Calcium 8.7 (L) 8.9 - 10.3 mg/dL   Total Protein 6.4 (L) 6.5 - 8.1 g/dL   Albumin 3.3 (L) 3.5 - 5.0 g/dL   AST 31 15 - 41 U/L   ALT 14 14 - 54 U/L   Alkaline Phosphatase 141 (H) 38 - 126 U/L   Total Bilirubin 2.8 (H) 0.3 - 1.2 mg/dL   GFR calc non Af Amer 9 (L) >60 mL/min   GFR calc Af Amer 10 (L) >60 mL/min   Anion gap 12 5 - 15    Studies/Results: [place] Renal  Result Date: 11/18/2017 CLINICAL DATA:   Acute renal failure EXAM: RENAL / URINARY TRACT ULTRASOUND COMPLETE COMPARISON:  Abdominal MRI dated January 01, 2017 FINDINGS: Right Kidney: Length: 10.5 cm. The cortical echotexture is increased diffusely and appears greater than that of the liver. There is a lower pole cyst which measures 1 cm in greatest dimension. There is no hydronephrosis. Left Kidney: Length: 10.8 cm. The renal cortical echotexture is mildly increased but not as great low flea is that on the right. There is no hydronephrosis. Bladder: The urinary bladder is decompressed by the Foley catheter. IMPRESSION: Increased renal cortical echotexture greatest on the right compatible with medical renal disease. There is no hydronephrosis. The urinary bladder was empty due to the presence of a Foley catheter. Electronically Signed   By: David  Swazil[place]Jordan M.D.   On: 11/18/2017 12:57   [place] Abdomen Limited  Result Date: 11/18/2017 CLINICAL DATA:  Hepatic cirrhosis, ascites. EXAM: LIMITED ABDOMEN ULTRASOUND FOR ASCITES TECHNIQUE: Limited ultrasound survey for ascites was performed in all four abdominal quadrants. COMPARISON:  No recent study in PACs FINDINGS: A large amount of ascites is observed throughout the abdomen. The liver where visualized appears shrunken. Bowel loops are floating in the  ascites. IMPRESSION: Large amount of ascites throughout the abdominal cavity. Electronically Signed   By: David  [place] M.D.   On: 11/18/2017 08:50   US Paracentesis  Result Date: 11/18/2017 INDICATION: Ascites EXAM: ULTRASOUND GUIDED DIAGNOSTIC AND THERAPEUTIC PARACENTESIS MEDICATIONS: None. COMPLICATIONS: None immediate. PROCEDURE: Informed written consent was obtained from the patient after a discussion of the risks, benefits and alternatives to treatment. A timeout was performed prior to the initiation of the procedure. Initial ultrasound scanning demonstrates a large amount of ascites within the right lower abdominal quadrant. The right lower abdomen was  prepped and draped in the usual sterile fashion. 1% lidocaine with epinephrine was used for local anesthesia. Following this, a 6 Fr Safe-T-Centesis catheter was introduced. An ultrasound image was saved for documentation purposes. The paracentesis was performed. The catheter was removed and a dressing was applied. The patient tolerated the procedure well without immediate post procedural complication. FINDINGS: A total of approximately 5 L of clear yellow fluid was removed. Samples were sent to the laboratory as requested by the clinical team. IMPRESSION: Successful ultrasound-guided paracentesis yielding 5 liters of peritoneal fluid. Electronically Signed   By: Alcide Clever M.D.   On: 11/18/2017 12:30    Scheduled Meds: . conjugated estrogens  1 Applicatorful Vaginal Daily  . docusate sodium  100 mg Oral BID  . hydrocortisone cream   Topical TID  . pantoprazole  40 mg Oral Daily  . pneumococcal 23 valent vaccine  0.5 mL Intramuscular Tomorrow-1000   Continuous Infusions: . [START ON 11/24/2017] albumin human     PRN Meds:acetaminophen **OR** acetaminophen, albuterol, alum & mag hydroxide-simeth, clorazepate, diphenhydrAMINE, HYDROcodone-acetaminophen, morphine injection, ondansetron **OR** ondansetron (ZOFRAN) IV, polyethylene glycol, polyvinyl alcohol, witch hazel-glycerin  Assessment/Plan:  Will have patient follow up outpatient for pessary management. Could consider large donut pessary or gellhorn pessary if this pessary fails. Continue daily vaginal estrogen cream.   LOS: 6 days   Shiquita Collignon R Sargent Mankey

## 2017-11-24 ENCOUNTER — Telehealth: Payer: Self-pay | Admitting: Family

## 2017-11-24 ENCOUNTER — Inpatient Hospital Stay: Payer: Medicare Other

## 2017-11-24 LAB — CBC
HEMATOCRIT: 33.5 % — AB (ref 35.0–47.0)
HEMOGLOBIN: 11.3 g/dL — AB (ref 12.0–16.0)
MCH: 34.5 pg — ABNORMAL HIGH (ref 26.0–34.0)
MCHC: 33.9 g/dL (ref 32.0–36.0)
MCV: 101.8 fL — AB (ref 80.0–100.0)
Platelets: 77 10*3/uL — ABNORMAL LOW (ref 150–440)
RBC: 3.29 MIL/uL — AB (ref 3.80–5.20)
RDW: 16.6 % — AB (ref 11.5–14.5)
WBC: 8.7 10*3/uL (ref 3.6–11.0)

## 2017-11-24 MED ORDER — ESTROGENS, CONJUGATED 0.625 MG/GM VA CREA: 1.0000 | TOPICAL_CREAM | Freq: Every day | VAGINAL | 12 refills | Status: DC

## 2017-11-24 MED ORDER — POLYETHYLENE GLYCOL 3350 17 G PO PACK: 17.0000 g | PACK | Freq: Every day | ORAL | 0 refills | Status: AC | PRN

## 2017-11-24 MED ORDER — POLYVINYL ALCOHOL 1.4 % OP SOLN: 1.0000 [drp] | OPHTHALMIC | 0 refills | Status: AC | PRN

## 2017-11-24 MED ORDER — HYDROCODONE-ACETAMINOPHEN 5-325 MG PO TABS: 1.0000 | ORAL_TABLET | Freq: Four times a day (QID) | ORAL | 0 refills | Status: DC | PRN

## 2017-11-24 MED ORDER — DIPHENHYDRAMINE HCL 25 MG PO CAPS: 25.0000 mg | ORAL_CAPSULE | Freq: Three times a day (TID) | ORAL | 0 refills | Status: DC | PRN

## 2017-11-24 MED ORDER — HYDROCORTISONE 0.5 % EX CREA: TOPICAL_CREAM | Freq: Three times a day (TID) | CUTANEOUS | 0 refills | Status: DC

## 2017-11-24 MED ORDER — WITCH HAZEL-GLYCERIN EX PADS: MEDICATED_PAD | CUTANEOUS | 12 refills | Status: AC | PRN

## 2017-11-24 MED ORDER — DOCUSATE SODIUM 100 MG PO CAPS: 100.0000 mg | ORAL_CAPSULE | Freq: Two times a day (BID) | ORAL | 0 refills | Status: DC | PRN

## 2017-11-24 MED ORDER — ALUM & MAG HYDROXIDE-SIMETH 200-200-20 MG/5ML PO SUSP: 30.0000 mL | Freq: Four times a day (QID) | ORAL | 0 refills | Status: DC | PRN

## 2017-11-24 NOTE — Progress Notes (Addendum)
New referral for [facility] services at home following discharge received from [facility]. Patient is 77 year old woman with a with past medical history of liver cirrhosis, vaginal/bladder prolapse, HTN, UTI, SVT and  GERD. She was to [facility] admitted on 11/17/2017 from her nephrologist office with elevated BUN and creatinine. On 1/29 she had a paracentesis that drained 5 liters. She has been followed by nephrology and gynecology this hospitalization. She has had a pessary placed x 2. Palliative medicine was consulted for goals of care and met with patient and her family. They at that time were undecided about dialysis, she has since decided against pursuing dialysis and hospice was recommended. Writer met in the room with patient's sons Nicole Kindred and QXIHWTU and daughter in law Gerald Stabs, patient was in ultrasound having a paracentesis prior to discharge. Writer initiated education regarding hospice services, philosophy and team approach to care with good understanding voiced.  Questions answered. DME needs include a hospital bed, BSC and transport wheel chair. Patient information faxed to referral, DME ordered for delivery today as soon as possible. [place]ne to be the contact. At this time patient remains a full code. Hospice information and contact number given to [facility]. Writer stopped by the room later to introduce her self to Mrs. Winterton, she was lying in bed, s/p thoracentesis, 6 liters of fluid removed. Daughter in Chiropractor at bedside. Patient did not Administrator, arts and had no questions. DME to be delivered between 1-2 today. Patient to discharge home via car. Hospital care team updated. Flo Shanks RN, BSN, [place] [facility] [place], hospital Liaison [phone]

## 2017-11-24 NOTE — Care Management (Signed)
Spoke with Ms. Merilyn BabaMoize's son Alinda Moneyony. States that they would like [facility] services in the home.  Dayna BarkerKaren Robertson, RN [facility] updated. Scheduled for paracentesis today. Possible discharge to home later today per Dr. Elisabeth PigeonVachhani. Gwenette GreetBrenda S Mirtie Bastyr RN MSN [facility] Care Management [phone]

## 2017-11-24 NOTE — Discharge Summary (Signed)
[facility]   PATIENT NAME: Susan Greer    MR#:  [id number]  DATE OF BIRTH:  11/10/1940  DATE OF ADMISSION:  11/17/2017 ADMITTING PHYSICIAN: Houston Siren, MD  DATE OF DISCHARGE:  11/24/17  PRIMARY CARE PHYSICIAN: Allegra Grana, FNP    ADMISSION DIAGNOSIS:  Acute renal failure cirrhosis lower extremity cellulitis   DISCHARGE DIAGNOSIS:  Active Problems:   Goals of care, counseling/discussion   AKI (acute kidney injury) (HCC)   Ascites   Acute renal failure (HCC)   Palliative care by specialist  Hospice care at home SECONDARY DIAGNOSIS:   Past Medical History:  Diagnosis Date  . GERD (gastroesophageal reflux disease)   . Hypertension   . Kidney stones   . SVT (supraventricular tachycardia) (HCC)    Dr. Lady Gary at [place], s/p adenosine  . UTI (urinary tract infection)     HOSPITAL COURSE:   HPI  Susan Greer  is a 77 y.o. female with a known history of hypertension, cirrhosis, lower extremity swelling on Lasix since October comes in as a direct admission from nephrology office due to elevated BUN and creatinine.  Patient has had difficulty urinating.  She has had a uterine prolapse for a long time.  She has seen gynecologist at [facility] and was thought to be a high risk candidate and conservative management was advised.  Today bladder scan here in the hospital shows 650 mL in the bladder. Patient's baseline creatinine is 1 entered at the nephrology office it was greater than 3.  Patient has no lower examinee edema.  She has been using a topical antibiotic cream for left leg cellulitis which is resolved.  No hematuria.  Does not use NSAIDs.  No fever. She also feels her abdomen is more distended than normal.  1. Liver cirrhosis with ascites.  Patient had ultrasound-guided paracentesis today ,might need outpatient paracentesis for comfort   2. Acute kidney injury.  As per nephrology hold off on diuretics at this time.  Foley  catheter placed.  Likely obstructive in nature with her vaginal prolapse.  GFR in dialysis range.  Patient does not want this.  Creatinine about the same at this point Patient and family is requesting hospice care at home 3. Prolapsed vaginal, cystocele rectocele and enterocele.  Pessary replaced again by gynecology.  4. Hypertension on metoprolol 5. Weakness.  Has not been well enough to walk with physical therapy so far. 6. Depression on Zoloft 7. Family to decide on home health with  hospice care as outpatient     DISCHARGE CONDITIONS:   fair  CONSULTS OBTAINED:  Treatment Team:  Natale Milch, MD Lamont Dowdy, MD   PROCEDURES pessary placement  DRUG ALLERGIES:  No Known Allergies  DISCHARGE MEDICATIONS:   Allergies as of 11/24/2017   No Known Allergies     Medication List    STOP taking these medications   doxycycline 100 MG tablet Commonly known as:  VIBRA-TABS   hydrocortisone 2.5 % rectal cream Commonly known as:  ANUSOL-HC   hydrocortisone 25 MG suppository Commonly known as:  ANUSOL-HC   metoprolol 200 MG 24 hr tablet Commonly known as:  TOPROL-XL   potassium chloride 10 MEQ tablet Commonly known as:  K-DUR   spironolactone 25 MG tablet Commonly known as:  ALDACTONE   vancomycin 125 MG capsule Commonly known as:  VANCOCIN   Vitamin D3 1000 units Caps     TAKE these medications   alum & mag  hydroxide-simeth 200-200-20 MG/5ML suspension Commonly known as:  MAALOX/MYLANTA Take 30 mLs by mouth every 6 (six) hours as needed for indigestion or heartburn.   clorazepate 7.5 MG tablet Commonly known as:  TRANXENE Take 7.5 mg by mouth 2 (two) times daily as needed for anxiety.   conjugated estrogens vaginal cream Commonly known as:  PREMARIN Place 1 Applicatorful vaginally daily. Start taking on:  11/25/2017   diphenhydrAMINE 25 mg capsule Commonly known as:  BENADRYL Take 1 capsule (25 mg total) by mouth every 8 (eight) hours as needed  for itching.   docusate sodium 100 MG capsule Commonly known as:  COLACE Take 1 capsule (100 mg total) by mouth 2 (two) times daily as needed for mild constipation.   HYDROcodone-acetaminophen 5-325 MG tablet Commonly known as:  NORCO/VICODIN Take 1 tablet by mouth every 6 (six) hours as needed for moderate pain.   hydrocortisone cream 0.5 % Apply topically 3 (three) times daily.   omeprazole 40 MG capsule Commonly known as:  PRILOSEC Take 40 mg by mouth daily.   polyethylene glycol packet Commonly known as:  MIRALAX / GLYCOLAX Take 17 g by mouth daily as needed for mild constipation.   polyvinyl alcohol 1.4 % ophthalmic solution Commonly known as:  LIQUIFILM TEARS Place 1 drop into both eyes as needed for dry eyes.   PRESERVISION AREDS 2+MULTI VIT Caps Take 1 capsule by mouth 2 (two) times daily.   rifaximin 550 MG Tabs tablet Commonly known as:  XIFAXAN Take 550 mg by mouth 2 (two) times daily.   witch hazel-glycerin pad Commonly known as:  TUCKS Apply topically as needed for itching.        DISCHARGE INSTRUCTIONS:  Follow-up with primary care physician and gynecology as needed Hospice care at home   DIET:  Low fat, Low cholesterol diet  DISCHARGE CONDITION:  Fair  ACTIVITY:  Activity as tolerated  OXYGEN:  Home Oxygen: No.   Oxygen Delivery: room air  DISCHARGE LOCATION:  home   If you experience worsening of your admission symptoms, develop shortness of breath, life threatening emergency, suicidal or homicidal thoughts you must seek medical attention immediately by calling 911 or calling your MD immediately  if symptoms less severe.  You Must read complete instructions/literature along with all the possible adverse reactions/side effects for all the Medicines you take and that have been prescribed to you. Take any new Medicines after you have completely understood and accpet all the possible adverse reactions/side effects.   Please note  You  were cared for by a hospitalist during your hospital stay. If you have any questions about your discharge medications or the care you received while you were in the hospital after you are discharged, you can call the unit and asked to speak with the hospitalist on call if the hospitalist that took care of you is not available. Once you are discharged, your primary care physician will handle any further medical issues. Please note that NO REFILLS for any discharge medications will be authorized once you are discharged, as it is imperative that you return to your primary care physician (or establish a relationship with a primary care physician if you do not have one) for your aftercare needs so that they can reassess your need for medications and monitor your lab values.     Today  No chief complaint on file.   Patient and family are leaning towards hospice care at home.  Patient had a paracentesis today.  Feeling better  ROS:  CONSTITUTIONAL: Denies fevers, chills. Denies any fatigue, weakness.  EYES: Denies blurry vision, double vision, eye pain. EARS, NOSE, THROAT: Denies tinnitus, ear pain, hearing loss. RESPIRATORY: Denies cough, wheeze, shortness of breath.  CARDIOVASCULAR: Denies chest pain, palpitations, edema.  GASTROINTESTINAL: Denies nausea, vomiting, diarrhea, abdominal pain. Denies bright red blood per rectum. GENITOURINARY: Denies dysuria, hematuria. ENDOCRINE: Denies nocturia or thyroid problems. HEMATOLOGIC AND LYMPHATIC: Denies easy bruising or bleeding. SKIN: Denies rash or lesion. MUSCULOSKELETAL: Denies pain in neck, back, shoulder, knees, hips or arthritic symptoms.  NEUROLOGIC: Denies paralysis, paresthesias.  PSYCHIATRIC: Denies anxiety or depressive symptoms.   VITAL SIGNS:  Blood pressure (!) 105/37, pulse 88, temperature 98.3 F (36.8 C), temperature source Oral, resp. rate 18, height 5\' 3"  (1.6 m), weight 51 kg (112 lb 7 oz), SpO2 97 %.  I/O:     Intake/Output Summary (Last 24 hours) at 11/24/2017 1329 Last data filed at 11/24/2017 1133 Gross per 24 hour  Intake 0 ml  Output 550 ml  Net -550 ml    PHYSICAL EXAMINATION:  GENERAL:  77 y.o.-year-old patient lying in the bed with no acute distress.  EYES: Pupils equal, round, reactive to light and accommodation. No scleral icterus. Extraocular muscles intact.  HEENT: Head atraumatic, normocephalic. Oropharynx and nasopharynx clear.  NECK:  Supple, no jugular venous distention. No thyroid enlargement, no tenderness.  LUNGS: Normal breath sounds bilaterally, no wheezing, rales,rhonchi or crepitation. No use of accessory muscles of respiration.  CARDIOVASCULAR: S1, S2 normal. No murmurs, rubs, or gallops.  ABDOMEN: Soft, non-tender, non-distended. Bowel sounds present.  EXTREMITIES: No pedal edema, cyanosis, or clubbing.  NEUROLOGIC: Cranial nerves II through XII are intact. . Sensation intact. Gait not checked.  PSYCHIATRIC: The patient is alert and oriented x 3.  SKIN: No obvious rash, lesion, or ulcer.   DATA REVIEW:   CBC Recent Labs  Lab 11/24/17 0315  WBC 8.7  HGB 11.3*  HCT 33.5*  PLT 77*    Chemistries  Recent Labs  Lab 11/23/17 0515  NA 137  K 4.7  CL 105  CO2 20*  GLUCOSE 95  BUN 95*  CREATININE 4.59*  CALCIUM 8.7*  AST 31  ALT 14  ALKPHOS 141*  BILITOT 2.8*    Cardiac Enzymes No results for input(s): TROPONINI in the last 168 hours.  Microbiology Results  Results for orders placed or performed during the hospital encounter of 11/17/17  Body fluid culture     Status: None   Collection Time: 11/18/17 12:00 PM  Result Value Ref Range Status   Specimen Description   Final    PERITONEAL Performed at [facility], [address], [place], [place] [address]    Special Requests   Final    NONE Performed at [facility], [address] [place], [place] [address]    Gram Stain NO WBC SEEN NO ORGANISMS SEEN   Final    Culture   Final    No growth aerobically or anaerobically. Performed at [facility], [address], [place], [place] [address]    Report Status 11/22/2017 FINAL  Final    RADIOLOGY:  US Paracentesis  Result Date: 11/24/2017 INDICATION: Cirrhosis and ascites. Status post paracentesis of 5 L of ascites on 11/18/2017 EXAM: ULTRASOUND GUIDED PARACENTESIS MEDICATIONS: None. COMPLICATIONS: None immediate. PROCEDURE: Informed written consent was obtained from the patient after a discussion of the risks, benefits and alternatives to treatment. A timeout was performed prior to the initiation of the procedure. Initial ultrasound was  performed to localize ascites. The right lower abdomen was prepped and draped in the usual sterile fashion. 1% lidocaine was used for local anesthesia. Following this, a 6 Fr Safe-T-Centesis catheter was introduced. An ultrasound image was saved for documentation purposes. The paracentesis was performed. The catheter was removed and a dressing was applied. The patient tolerated the procedure well without immediate post procedural complication. FINDINGS: A total of approximately 6.7 L of clear, yellow fluid was removed. Samples were sent to the laboratory as requested by the clinical team. IMPRESSION: Successful ultrasound-guided paracentesis yielding 6.7 liters of peritoneal fluid. Electronically Signed   By: Irish LackGlenn  Yamagata M.D.   On: 11/24/2017 11:40    EKG:   Orders placed or performed in visit on 07/23/17  . EKG 12-Lead      Management plans discussed with the patient, family and they are in agreement.  CODE STATUS:     Code Status Orders  (From admission, onward)        Start     Ordered   11/17/17 1654  Full code  Continuous     11/17/17 1654    Code Status History    Date Active Date Inactive Code Status Order ID Comments User Context   This patient has a current code status but no historical code status.    Advance Directive Documentation      Most Recent Value  Type of Advance Directive  Living will  Pre-existing out of facility DNR order (yellow form or pink MOST form)  No data  "MOST" Form in Place?  No data      TOTAL TIME TAKING CARE OF THIS PATIENT: 43 minutes.   Note: This dictation was prepared with Dragon dictation along with smaller phrase technology. Any transcriptional errors that result from this process are unintentional.   @[facility] @  on 11/24/2017 at 1:29 PM  Between 7am to 6pm - Pager - [phone]  After 6pm go to [email or web address] - password [email or web address]  [facility]  Office  [phone]  CC: Primary care physician; Allegra GranaArnett, Margaret G, FNP

## 2017-11-24 NOTE — Discharge Instructions (Signed)
Follow-up with primary care physician and gynecology as needed Hospice care at home

## 2017-11-24 NOTE — Telephone Encounter (Signed)
Copied from CRM [id number]. Topic: General - Other >> Nov 24, 2017 10:53 AM Crist InfanteHarrald, Kathy J wrote: Reason for CRM:  Angelique Blonderenise with [facility] called to advise pt is discharging today with Hospice and needs a verbal today from Dr Darrick Huntsmanullo to proceed. Pt sees Rennie PlowmanMargaret Arnett for pcp

## 2017-11-24 NOTE — Progress Notes (Signed)
11/24/2017  .15:30  Susan Greer to be D/C'd Home per MD order.  Discussed prescriptions and follow up appointments with the patient. Prescriptions given to patient, medication list explained in detail. Pt verbalized understanding.  Allergies as of 11/24/2017   No Known Allergies     Medication List    STOP taking these medications   doxycycline 100 MG tablet Commonly known as:  VIBRA-TABS   hydrocortisone 2.5 % rectal cream Commonly known as:  ANUSOL-HC   hydrocortisone 25 MG suppository Commonly known as:  ANUSOL-HC   metoprolol 200 MG 24 hr tablet Commonly known as:  TOPROL-XL   potassium chloride 10 MEQ tablet Commonly known as:  K-DUR   spironolactone 25 MG tablet Commonly known as:  ALDACTONE   vancomycin 125 MG capsule Commonly known as:  VANCOCIN   Vitamin D3 1000 units Caps     TAKE these medications   alum & mag hydroxide-simeth 200-200-20 MG/5ML suspension Commonly known as:  MAALOX/MYLANTA Take 30 mLs by mouth every 6 (six) hours as needed for indigestion or heartburn.   clorazepate 7.5 MG tablet Commonly known as:  TRANXENE Take 7.5 mg by mouth 2 (two) times daily as needed for anxiety.   conjugated estrogens vaginal cream Commonly known as:  PREMARIN Place 1 Applicatorful vaginally daily. Start taking on:  11/25/2017   diphenhydrAMINE 25 mg capsule Commonly known as:  BENADRYL Take 1 capsule (25 mg total) by mouth every 8 (eight) hours as needed for itching.   docusate sodium 100 MG capsule Commonly known as:  COLACE Take 1 capsule (100 mg total) by mouth 2 (two) times daily as needed for mild constipation.   HYDROcodone-acetaminophen 5-325 MG tablet Commonly known as:  NORCO/VICODIN Take 1 tablet by mouth every 6 (six) hours as needed for moderate pain.   hydrocortisone cream 0.5 % Apply topically 3 (three) times daily.   omeprazole 40 MG capsule Commonly known as:  PRILOSEC Take 40 mg by mouth daily.   polyethylene glycol  packet Commonly known as:  MIRALAX / GLYCOLAX Take 17 g by mouth daily as needed for mild constipation.   polyvinyl alcohol 1.4 % ophthalmic solution Commonly known as:  LIQUIFILM TEARS Place 1 drop into both eyes as needed for dry eyes.   PRESERVISION AREDS 2+MULTI VIT Caps Take 1 capsule by mouth 2 (two) times daily.   rifaximin 550 MG Tabs tablet Commonly known as:  XIFAXAN Take 550 mg by mouth 2 (two) times daily.   witch hazel-glycerin pad Commonly known as:  TUCKS Apply topically as needed for itching.       Vitals:   11/24/17 1044 11/24/17 1323  BP: (!) 104/52 (!) 105/37  Pulse: 79 88  Resp: 16 18  Temp:  98.3 F (36.8 C)  SpO2: 96% 97%    Skin clean, dry and intact without evidence of skin break down, no evidence of skin tears noted. IV catheter discontinued intact. Site without signs and symptoms of complications. Dressing and pressure applied. Pt denies pain at this time. No complaints noted.  An After Visit Summary was printed and given to the patient. Patient escorted via WC, and D/C home via private auto.  Susan Greer, Susan Greer

## 2017-11-25 ENCOUNTER — Telehealth: Payer: Self-pay

## 2017-11-25 NOTE — Telephone Encounter (Signed)
Patient family per discharge note in agreement for Hospice care needed verbal for DC to begin hospice care for patient stated need ASAP. Verbal given

## 2017-11-25 NOTE — Telephone Encounter (Signed)
Please advise 

## 2017-11-25 NOTE — Telephone Encounter (Signed)
Copied from CRM [id number]. Topic: General - Other >> Nov 24, 2017 10:53 AM Crist InfanteHarrald, Kathy J wrote: Reason for CRM:  [name] with [facility] called to advise pt is discharging today with Hospice and needs a verbal from Dr Darrick Huntsmanullo to proceed. Pt sees Rennie PlowmanMargaret Arnett for pcp >> Nov 24, 2017  4:18 PM Guinevere FerrariMorris, Sharamare E, NT wrote: Angelique Blonderenise form [facility] called back to see if she can get verbal orders for patient to have hospice services in the patient's home. She would like a call back. Pls call back for orders [phone]

## 2017-11-25 NOTE — Telephone Encounter (Signed)
Susan Greer with hospice following up on this request. Family is requesting a nurse to come out today, as pt was discharged yesterday, per note. and they need the verbal today please. Needs to be signed by Dr Darrick Huntsmanullo anyway, because she is the supervising physician.  Please call ASAP.

## 2017-11-27 ENCOUNTER — Telehealth: Payer: Self-pay | Admitting: Family

## 2017-11-27 NOTE — Telephone Encounter (Signed)
Copied from [place]. Topic: General - Other >> Nov 27, 2017 10:03 AM Susan Greer wrote: Reason for CRM: Crystal from [facility] wanting verbal order about the paper work that they had received Dr Nena Polio didn't check yes or no on the paper if pt need morphine in comfort kit

## 2017-11-27 NOTE — Telephone Encounter (Signed)
Form placed in your quick sign folder Thanks

## 2017-12-01 ENCOUNTER — Encounter: Payer: Self-pay | Admitting: Family

## 2017-12-01 ENCOUNTER — Ambulatory Visit: Admitting: Family

## 2017-12-01 VITALS — BP 126/98 | HR 108 | Temp 97.6°F | Resp 16 | Wt 109.5 lb

## 2017-12-01 DIAGNOSIS — K7460 Unspecified cirrhosis of liver: Secondary | ICD-10-CM | POA: Diagnosis not present

## 2017-12-01 DIAGNOSIS — N8110 Cystocele, unspecified: Secondary | ICD-10-CM | POA: Diagnosis not present

## 2017-12-01 DIAGNOSIS — K649 Unspecified hemorrhoids: Secondary | ICD-10-CM | POA: Diagnosis not present

## 2017-12-01 DIAGNOSIS — R188 Other ascites: Secondary | ICD-10-CM | POA: Diagnosis not present

## 2017-12-01 MED ORDER — HYDROCORTISONE ACETATE 25 MG RE SUPP: 25.0000 mg | Freq: Two times a day (BID) | RECTAL | 0 refills | Status: DC

## 2017-12-01 MED ORDER — HYDROCORTISONE 2.5 % RE CREA: 1.0000 "application " | TOPICAL_CREAM | Freq: Two times a day (BID) | RECTAL | 0 refills | Status: DC

## 2017-12-01 NOTE — Telephone Encounter (Signed)
Has this form been completed for pt?

## 2017-12-01 NOTE — Assessment & Plan Note (Signed)
Trial of topical medication.  If hemorhoid does not improve with topical medications, will advise consult for hemorrhoidectomy. Concern also for h/o rectal prolapse though presentation more consistent with hemorrhoids.   Will follow as patient may need colo-rectal surgeon as she has in past. Would need to consider if she was a surgical candidate and her desires at this time for intervention.  Pending stool cards.

## 2017-12-01 NOTE — Progress Notes (Signed)
Subjective:    Patient ID: Susan Greer, female    DOB: 07/27/1941, 77 y.o.   MRN: [id number]  CC: Susan Greer is a 77 y.o. female who presents today for follow up.   HPI: Complains of external hemorroid ; has been taking colace , following high fiber diet, water. Denies straining. Notes BRB on toilet paper when wipes. No melana stool . No recent colonoscopy.   Also complains of leaking urinary catheter from vagina. Hasnt been changed since over a week. Occasional burning in vagina. No chills, fever, low back ache.   Daughter in laws present  Not elligible for liver transplant  Weight is stable at this time. No LE edema. Has been seen by [facility], Sheryn BisonElliott, Wohl.    Recent hospitalization with admission 11/17/17 ; direct admission from nephrology due to elevated BUN/creatinine, difficulty urinating/ prolapsed uterus, s/p pessary. Following OB GYN Shuman for pessary.  Paracentesis of 5L 11/18/17  hospice care at home  [facility] will do as needed paracentesis.           HISTORY:  Past Medical History:  Diagnosis Date  . GERD (gastroesophageal reflux disease)   . Hypertension   . Kidney stones   . SVT (supraventricular tachycardia) (HCC)    [facility] at [facility], s/p adenosine  . UTI (urinary tract infection)    Past Surgical History:  Procedure Laterality Date  . ABDOMINAL HYSTERECTOMY     Dr. Weston AnnaEllington, for fibroid tumor and endometriosis  . ESOPHAGOGASTRODUODENOSCOPY (EGD) WITH PROPOFOL N/A 01/27/2017   Procedure: ESOPHAGOGASTRODUODENOSCOPY (EGD) WITH PROPOFOL;  Surgeon: Scot Junobert T Elliott, MD;  [facility];  Service: Endoscopy;  Laterality: N/A;   Family History  Problem Relation Age of Onset  . Hypertension Mother   . Cancer Mother [id number]       breast  . Hypertension Father   . Pneumonia Father   . Diabetes Brother   . Hypertension Brother   . Cancer Sister 30       breast    Allergies: Patient has no known allergies. Current  Outpatient Medications on File Prior to Visit  Medication Sig Dispense Refill  . alum & mag hydroxide-simeth (MAALOX/MYLANTA) 200-200-20 MG/5ML suspension Take 30 mLs by mouth every 6 (six) hours as needed for indigestion or heartburn. 355 mL 0  . clorazepate (TRANXENE) 7.5 MG tablet Take 7.5 mg by mouth 2 (two) times daily as needed for anxiety.    . conjugated estrogens (PREMARIN) vaginal cream Place 1 Applicatorful vaginally daily. 42.5 g 12  . diphenhydrAMINE (BENADRYL) 25 mg capsule Take 1 capsule (25 mg total) by mouth every 8 (eight) hours as needed for itching. 30 capsule 0  . docusate sodium (COLACE) 100 MG capsule Take 1 capsule (100 mg total) by mouth 2 (two) times daily as needed for mild constipation. 10 capsule 0  . HYDROcodone-acetaminophen (NORCO/VICODIN) 5-325 MG tablet Take 1 tablet by mouth every 6 (six) hours as needed for moderate pain. 30 tablet 0  . hydrocortisone cream 0.5 % Apply topically 3 (three) times daily. 30 g 0  . Multiple Vitamins-Minerals (PRESERVISION AREDS 2+MULTI VIT) CAPS Take 1 capsule by mouth 2 (two) times daily.    Marland Kitchen. omeprazole (PRILOSEC) 40 MG capsule Take 40 mg by mouth daily.     . polyethylene glycol (MIRALAX / GLYCOLAX) packet Take 17 g by mouth daily as needed for mild constipation. 14 each 0  . polyvinyl alcohol (LIQUIFILM TEARS) 1.4 % ophthalmic solution Place 1 drop  into both eyes as needed for dry eyes. 15 mL 0  . rifaximin (XIFAXAN) 550 MG TABS tablet Take 550 mg by mouth 2 (two) times daily.     Marland Kitchen witch hazel-glycerin (TUCKS) pad Apply topically as needed for itching. 40 each 12   No current facility-administered medications on file prior to visit.     Social History   Tobacco Use  . Smoking status: Never Smoker  . Smokeless tobacco: Never Used  Substance Use Topics  . Alcohol use: No  . Drug use: No    Review of Systems  Constitutional: Negative for chills and fever.  Respiratory: Negative for cough.   Cardiovascular: Negative  for chest pain, palpitations and leg swelling.  Gastrointestinal: Negative for nausea and vomiting.      Objective:    BP (!) 126/98 (BP Location: Left Arm, Patient Position: Sitting, Cuff Size: Normal)   Pulse (!) 108   Temp 97.6 F (36.4 C) (Oral)   Resp 16   Wt 109 lb 8 oz (49.7 kg)   SpO2 98%   BMI 19.40 kg/m  BP Readings from Last 3 Encounters:  12/01/17 (!) 126/98  11/24/17 (!) 105/37  07/29/17 128/68   Wt Readings from Last 3 Encounters:  12/01/17 109 lb 8 oz (49.7 kg)  11/24/17 112 lb 7 oz (51 kg)  08/27/17 119 lb 8 oz (54.2 kg)    Physical Exam  Constitutional: She appears well-developed and well-nourished.  Eyes: Conjunctivae are normal.  Cardiovascular: Normal rate, regular rhythm, normal heart sounds and normal pulses.  Pulmonary/Chest: Effort normal and breath sounds normal. She has no wheezes. She has no rhonchi. She has no rales.  Genitourinary:     Genitourinary Comments: Two tender, skin-colored prolapsed internal hemorrhoids noted around anus. No purple discoloration or bleeding noted. No thrombus appreciated in hemorrhoid(s).   Catheter appears to be inserted in vagina. When patient sat up after exam, urine leaked down her leg.   Neurological: She is alert.  Skin: Skin is warm and dry.  Psychiatric: She has a normal mood and affect. Her speech is normal and behavior is normal. Thought content normal.  Vitals reviewed.      Assessment & Plan:   Problem List Items Addressed This Visit      Cardiovascular and Mediastinum   Hemorrhoids - Primary    Trial of topical medication.  If hemorhoid does not improve with topical medications, will advise consult for hemorrhoidectomy. Concern also for h/o rectal prolapse though presentation more consistent with hemorrhoids.   Will follow as patient may need colo-rectal surgeon as she has in past. Would need to consider if she was a surgical candidate and her desires at this time for intervention.  Pending  stool cards.       Relevant Medications   hydrocortisone (ANUSOL-HC) 25 MG suppository   hydrocortisone (ANUSOL-HC) 2.5 % rectal cream   Other Relevant Orders   Fecal occult blood, imunochemical     Digestive   Cirrhosis of liver (HCC)    Fluid retention has improved at this time. Advised to continue following with Elliot/Wohl as patient preference is not to go to [facility]. Will follow.        Genitourinary   Female bladder prolapse    Improved at this time. Patient is making urine and able to have BM.   Urine leakages appears to be coming from vagina and not further down the catheter. Advised to have hospice change foley catheter to see if rectifies and  take urine specimen when changed for culture.   Discussed with Schuman regarding pessary, urinary catheter. Notes a small bladder capacity. Advised pessary to be cleaned replaced every 6 months. We discussed that the foley was for comfort and secondary if more of prolapse, the catheter would allow her to make urine and not be obstructed. Other options for straight cath.   Call placed to patient to discuss the above.             I am having Susan Punt start on hydrocortisone and hydrocortisone. I am also having her maintain her clorazepate, PRESERVISION AREDS 2+MULTI VIT, omeprazole, rifaximin, alum & mag hydroxide-simeth, conjugated estrogens, diphenhydrAMINE, polyethylene glycol, witch hazel-glycerin, hydrocortisone cream, docusate sodium, HYDROcodone-acetaminophen, and polyvinyl alcohol.   Meds ordered this encounter  Medications  . hydrocortisone (ANUSOL-HC) 25 MG suppository    Sig: Place 1 suppository (25 mg total) rectally 2 (two) times daily.    Dispense:  12 suppository    Refill:  0    Order Specific Question:   Supervising Provider    Answer:   Darrick Huntsman, TERESA L [[id number]]  . hydrocortisone (ANUSOL-HC) 2.5 % rectal cream    Sig: Place 1 application rectally 2 (two) times daily.    Dispense:  30 g    Refill:  0      Order Specific Question:   Supervising Provider    Answer:   Sherlene Shams [[id number]]    Return precautions given.   Risks, benefits, and alternatives of the medications and treatment plan prescribed today were discussed, and patient expressed understanding.   Education regarding symptom management and diagnosis given to patient on AVS.  Continue to follow with Allegra Grana, FNP for routine health maintenance.   Susan Punt and I agreed with plan.   Rennie Plowman, FNP

## 2017-12-01 NOTE — Patient Instructions (Addendum)
Discuss with hospice nurse about replacing foley catheter to help with leaking  Continuing following with [facility]  I will [facility] with [facility] at [facility].

## 2017-12-01 NOTE — Assessment & Plan Note (Addendum)
Improved at this time. Patient is making urine and able to have BM.   Urine leakages appears to be coming from vagina and not further down the catheter. Advised to have hospice change foley catheter to see if rectifies and take urine specimen when changed for culture.   Discussed with Schuman regarding pessary, urinary catheter. Notes a small bladder capacity. Advised pessary to be cleaned replaced every 6 months. We discussed that the foley was for comfort and secondary if more of prolapse, the catheter would allow her to make urine and not be obstructed. Other options for straight cath.   Call placed to patient to discuss the above.

## 2017-12-01 NOTE — Telephone Encounter (Signed)
Spoke with [facility] all paperwork was received .

## 2017-12-01 NOTE — Assessment & Plan Note (Signed)
Fluid retention has improved at this time. Advised to continue following with Susan Greer/Wohl as patient preference is not to go to [facility]. Will follow.

## 2017-12-02 ENCOUNTER — Other Ambulatory Visit: Payer: Self-pay | Admitting: Nephrology

## 2017-12-02 DIAGNOSIS — R188 Other ascites: Secondary | ICD-10-CM

## 2017-12-02 NOTE — Telephone Encounter (Signed)
Copied from [place] [id number]. Topic: General - Other >> Dec 02, 2017 10:06 AM Lolita Rieger, RMA wrote: Reason for CRM: Crystal from [place] called again and stated that she needs paperwork stating yes or no if it is ok to include morphine in comfort kit Call back  [phone]

## 2017-12-02 NOTE — Discharge Instructions (Signed)
Paracentesis, Care After °Refer to this sheet in the next few weeks. These instructions provide you with information about caring for yourself after your procedure. Your health care provider may also give you more specific instructions. Your treatment has been planned according to current medical practices, but problems sometimes occur. Call your health care provider if you have any problems or questions after your procedure. °What can I expect after the procedure? °After your procedure, it is common to have a small amount of clear fluid coming from the puncture site. °Follow these instructions at home: °· Return to your normal activities as told by your health care provider. Ask your health care provider what activities are safe for you. °· Take over-the-counter and prescription medicines only as told by your health care provider. °· Do not take baths, swim, or use a hot tub until your health care provider approves. °· Follow instructions from your health care provider about: °? How to take care of your puncture site. °? When and how you should change your bandage (dressing). °? When you should remove your dressing. °· Check your puncture area every day signs of infection. Watch for: °? Redness, swelling, or pain. °? Fluid, blood, or pus. °· Keep all follow-up visits as told by your health care provider. This is important. °Contact a health care provider if: °· You have redness, swelling, or pain at your puncture site. °· You start to have more clear fluid coming from your puncture site. °· You have blood or pus coming from your puncture site. °· You have chills. °· You have a fever. °Get help right away if: °· You develop chest pain or shortness of breath. °· You develop increasing pain, discomfort, or swelling in your abdomen. °· You feel dizzy or light-headed or you pass out. °This information is not intended to replace advice given to you by your health care provider. Make sure you discuss any questions you  have with your health care provider. °Document Released: 02/21/2015 Document Revised: 03/14/2016 Document Reviewed: 12/20/2014 °Elsevier Interactive Patient Education © 2018 Elsevier Inc. ° °

## 2017-12-02 NOTE — Telephone Encounter (Signed)
[facility]

## 2017-12-03 ENCOUNTER — Telehealth: Payer: Self-pay | Admitting: Internal Medicine

## 2017-12-03 MED ORDER — SULFAMETHOXAZOLE-TRIMETHOPRIM 800-160 MG PO TABS: 1.0000 | ORAL_TABLET | Freq: Two times a day (BID) | ORAL | 0 refills | Status: DC

## 2017-12-03 MED ORDER — FLUCONAZOLE 150 MG PO TABS: 150.0000 mg | ORAL_TABLET | Freq: Every day | ORAL | 0 refills | Status: DC

## 2017-12-03 NOTE — Telephone Encounter (Signed)
Copied from CRM [phone]. Topic: Quick Communication - See Telephone Encounter >> Dec 03, 2017  3:15 PM Rudi CocoLathan, Aphrodite Harpenau M, [place] wrote: CRM for notification. See Telephone encounter for:   12/03/17.Mitzi church calling from [facility] to let Dr. Darrick Huntsmanullo know that they did a urine check on pt. And there was yeast, bacteria, and white blood count greater than 30.(indwelling foley) pt. Is also symptomatic. Wanting to know if Dr. Darrick Huntsmanullo could call and antibiotic in for pt. Mitzi can be reached at [phone]     [facility] [place] - [address] [place] [address] Phone: [phone] Fax: [phone]

## 2017-12-03 NOTE — Telephone Encounter (Signed)
Fluconazole x 2 days septra bid x 7 days  sent to [facility]

## 2017-12-04 ENCOUNTER — Ambulatory Visit (INDEPENDENT_AMBULATORY_CARE_PROVIDER_SITE_OTHER): Admitting: Obstetrics and Gynecology

## 2017-12-04 ENCOUNTER — Ambulatory Visit

## 2017-12-04 ENCOUNTER — Telehealth: Payer: Self-pay | Admitting: *Deleted

## 2017-12-04 ENCOUNTER — Encounter: Payer: Self-pay | Admitting: Obstetrics and Gynecology

## 2017-12-04 ENCOUNTER — Ambulatory Visit
Admission: RE | Admit: 2017-12-04 | Discharge: 2017-12-04 | Disposition: A | Discharge status: Home / Self Care | Source: Ambulatory Visit | Attending: Nephrology | Admitting: Nephrology

## 2017-12-04 VITALS — BP 138/82 | HR 126 | Wt 115.0 lb

## 2017-12-04 DIAGNOSIS — R188 Other ascites: Secondary | ICD-10-CM | POA: Insufficient documentation

## 2017-12-04 DIAGNOSIS — N898 Other specified noninflammatory disorders of vagina: Secondary | ICD-10-CM | POA: Diagnosis not present

## 2017-12-04 DIAGNOSIS — K623 Rectal prolapse: Secondary | ICD-10-CM | POA: Diagnosis not present

## 2017-12-04 DIAGNOSIS — K649 Unspecified hemorrhoids: Secondary | ICD-10-CM

## 2017-12-04 DIAGNOSIS — Z515 Encounter for palliative care: Secondary | ICD-10-CM

## 2017-12-04 DIAGNOSIS — N8110 Cystocele, unspecified: Secondary | ICD-10-CM | POA: Diagnosis not present

## 2017-12-04 DIAGNOSIS — K7460 Unspecified cirrhosis of liver: Secondary | ICD-10-CM

## 2017-12-04 MED ORDER — SODIUM CHLORIDE FLUSH 0.9 % IV SOLN
INTRAVENOUS | Status: AC
  Filled 2017-12-04: qty 10

## 2017-12-04 MED ORDER — METHYLPREDNISOLONE ACETATE 80 MG/ML IJ SUSP
INTRAMUSCULAR | Status: AC
  Filled 2017-12-04: qty 2

## 2017-12-04 MED ORDER — ALBUMIN HUMAN 25 % IV SOLN
INTRAVENOUS | Status: AC
  Administered 2017-12-04: 25 g
  Filled 2017-12-04: qty 100

## 2017-12-04 NOTE — Telephone Encounter (Signed)
Noted,  hydrocodone added to allergy list .  Hospice should have morphine available already,  Shouldn't they? Based on the forms that I signed for them last week

## 2017-12-04 NOTE — Progress Notes (Signed)
Patient ID: Susan Greer, female   DOB: 1940-10-30, 77 y.o.   MRN: [id number]  Reason for Consult: Pessary Check (pessary looks like it has flipped); Urinary Tract Infection (on ABX w/catheter); and Hemorrhoids   Referred by Allegra GranaArnett, Margaret G, FNP  Subjective:     HPI:  Susan Greer is a 77 y.o. female Pessary removed, washed and replaced. Speculum exam performed. Two areas or erosion along posterior vaginal wall and at apex. No bowel visible. Hospice has not replaced foley yet. They were not comfortable because of the prolapse.  Patient was recently diagnosed with a urinary tract infection and yeast infection. Antibiotics ordered, she has not picked them up yet today.   Past Medical History:  Diagnosis Date  . GERD (gastroesophageal reflux disease)   . Hypertension   . Kidney stones   . SVT (supraventricular tachycardia) (HCC)    [facility], s/p adenosine  . UTI (urinary tract infection)    Family History  Problem Relation Age of Onset  . Hypertension Mother   . Cancer Mother [id number]       breast  . Hypertension Father   . Pneumonia Father   . Diabetes Brother   . Hypertension Brother   . Cancer Sister 2930       breast   Past Surgical History:  Procedure Laterality Date  . ABDOMINAL HYSTERECTOMY     Dr. Weston AnnaEllington, for fibroid tumor and endometriosis  . ESOPHAGOGASTRODUODENOSCOPY (EGD) WITH PROPOFOL N/A 01/27/2017   Procedure: ESOPHAGOGASTRODUODENOSCOPY (EGD) WITH PROPOFOL;  Surgeon: Scot Junobert T Elliott, MD;  [facility];  Service: Endoscopy;  Laterality: N/A;    Short Social History:  Social History   Tobacco Use  . Smoking status: Never Smoker  . Smokeless tobacco: Never Used  Substance Use Topics  . Alcohol use: No    No Known Allergies  Current Outpatient Medications  Medication Sig Dispense Refill  . alum & mag hydroxide-simeth (MAALOX/MYLANTA) 200-200-20 MG/5ML suspension Take 30 mLs by mouth every 6 (six) hours as needed for indigestion  or heartburn. 355 mL 0  . clorazepate (TRANXENE) 7.5 MG tablet Take 7.5 mg by mouth 2 (two) times daily as needed for anxiety.    . conjugated estrogens (PREMARIN) vaginal cream Place 1 Applicatorful vaginally daily. 42.5 g 12  . diphenhydrAMINE (BENADRYL) 25 mg capsule Take 1 capsule (25 mg total) by mouth every 8 (eight) hours as needed for itching. 30 capsule 0  . docusate sodium (COLACE) 100 MG capsule Take 1 capsule (100 mg total) by mouth 2 (two) times daily as needed for mild constipation. 10 capsule 0  . fluconazole (DIFLUCAN) 150 MG tablet Take 1 tablet (150 mg total) by mouth daily. 2 tablet 0  . hydrocortisone (ANUSOL-HC) 2.5 % rectal cream Place 1 application rectally 2 (two) times daily. 30 g 0  . hydrocortisone (ANUSOL-HC) 25 MG suppository Place 1 suppository (25 mg total) rectally 2 (two) times daily. 12 suppository 0  . hydrocortisone cream 0.5 % Apply topically 3 (three) times daily. 30 g 0  . Multiple Vitamins-Minerals (PRESERVISION AREDS 2+MULTI VIT) CAPS Take 1 capsule by mouth 2 (two) times daily.    Marland Kitchen. omeprazole (PRILOSEC) 40 MG capsule Take 40 mg by mouth daily.     Marland Kitchen. oxybutynin (DITROPAN) 5 MG tablet Take 5 mg by mouth 3 (three) times daily.    . polyethylene glycol (MIRALAX / GLYCOLAX) packet Take 17 g by mouth daily as needed for mild constipation. 14 each 0  .  polyvinyl alcohol (LIQUIFILM TEARS) 1.4 % ophthalmic solution Place 1 drop into both eyes as needed for dry eyes. 15 mL 0  . rifaximin (XIFAXAN) 550 MG TABS tablet Take 550 mg by mouth 2 (two) times daily.     Marland Kitchen sulfamethoxazole-trimethoprim (BACTRIM DS,SEPTRA DS) 800-160 MG tablet Take 1 tablet by mouth 2 (two) times daily. 14 tablet 0  . witch hazel-glycerin (TUCKS) pad Apply topically as needed for itching. 40 each 12   No current facility-administered medications for this visit.    Facility-Administered Medications Ordered in Other Visits  Medication Dose Route Frequency Provider Last Rate Last Dose  .  albumin human 25 % solution           . sodium chloride flush 0.9 % injection             Review of Systems  Constitutional: Negative for chills, fatigue, fever and unexpected weight change.  HENT: Negative for trouble swallowing.  Eyes: Negative for loss of vision.  Respiratory: Negative for cough, shortness of breath and wheezing.  Cardiovascular: Negative for chest pain, leg swelling, palpitations and syncope.  GI: Negative for abdominal pain, blood in stool, diarrhea, nausea and vomiting.  GU: Negative for difficulty urinating, dysuria, frequency and hematuria.  Musculoskeletal: Positive for back pain. Negative for leg pain and joint pain.  Skin: Negative for rash.  Neurological: Negative for dizziness, headaches, light-headedness, numbness and seizures.  Psychiatric: Negative for behavioral problem, confusion, depressed mood and sleep disturbance.        Objective:  Objective   Vitals:   12/04/17 1108  BP: 138/82  Pulse: (!) 126  Weight: 115 lb (52.2 kg)   Body mass index is 20.37 kg/m.  Physical Exam  Constitutional: She is oriented to person, place, and time. She appears well-developed and well-nourished.  HENT:  Head: Normocephalic and atraumatic.  Eyes: EOM are normal.  Cardiovascular: Normal rate, regular rhythm and normal heart sounds.  Pulmonary/Chest: Effort normal and breath sounds normal.  Genitourinary:  Genitourinary Comments: Copious foul smelling purulent discharge from the vagina. Pessary removed and replaced. Speculum exam showed that vaginal cuff was intact. Two areas of erosion noted, no bowel seen of felt on bimanual exam.    Small rectal prolapse.  Neurological: She is alert and oriented to person, place, and time.  Skin: Skin is warm and dry.  Psychiatric: She has a normal mood and affect. Her behavior is normal. Judgment and thought content normal.  Nursing note and vitals reviewed.       Assessment/Plan:    76yo with total vaginal  prolapse, enterocele and rectal prolapse. Pessary in place and helping vaginal prolapse.  Copious vaginal discharge, purulent foul smelling. Will send Nuswab and One Swab. Discussed with Mitzy her home hospice nurse that she can change foley. This should be done every 30 days. Mitzy phone number is [phone].   If swabs do not give a source on infection will send culture at next visit.  Discussed continuing daily application of estrogen ointment. Patient would like to initiate sitz baths. That would be fine with me.     Over 40 minutes spent face to face counseling patient and arranging care.   Natale Milch MD [facility] 12/04/17 2:41 PM

## 2017-12-04 NOTE — Telephone Encounter (Signed)
Yes , they can have hospice on call wright until PCP reached.

## 2017-12-04 NOTE — Telephone Encounter (Signed)
Spoke with Susan Greer last night to let her know what medication Dr. Darrick Huntsmanullo had sent in for the pt. Susan Greer gave a verbal understanding.

## 2017-12-04 NOTE — Telephone Encounter (Signed)
Copied from CRM [id number]. Topic: General - Other >> Dec 04, 2017  9:38 AM Debroah LoopLander, Lumin L wrote: Reason for CRM: Mitzie, RN BSN Case Manager, w/ [facility] calling to notify that patient advised to stop hydrocodone due to allergic reaction; tongue swelling, lip swelling, itching, They advised to take benadryl to help. She will be seeing OB/GYN today and they will try to see if her OB/GYN will be willing to prescribe something else for pain, possibly morphine, b/c pain is getting worse, but they can't be sure OB/GYN will do so. This started when she was hospitalized and began the hydrocodone.

## 2017-12-05 ENCOUNTER — Ambulatory Visit (INDEPENDENT_AMBULATORY_CARE_PROVIDER_SITE_OTHER): Admitting: Obstetrics and Gynecology

## 2017-12-05 ENCOUNTER — Ambulatory Visit
Admission: RE | Admit: 2017-12-05 | Discharge: 2017-12-05 | Disposition: A | Discharge status: Home / Self Care | Source: Ambulatory Visit | Attending: Nephrology | Admitting: Nephrology

## 2017-12-05 DIAGNOSIS — N8110 Cystocele, unspecified: Secondary | ICD-10-CM

## 2017-12-05 DIAGNOSIS — Z4689 Encounter for fitting and adjustment of other specified devices: Secondary | ICD-10-CM

## 2017-12-05 DIAGNOSIS — Z515 Encounter for palliative care: Secondary | ICD-10-CM

## 2017-12-05 MED ORDER — OXYCODONE HCL 5 MG PO TABS
5.0000 mg | ORAL_TABLET | ORAL | 0 refills | Status: DC | PRN
Start: 2017-12-05 — End: 2018-03-27

## 2017-12-05 MED ORDER — OXYCODONE HCL 5 MG PO TABS: 5.0000 mg | ORAL_TABLET | ORAL | 0 refills | Status: DC | PRN

## 2017-12-05 NOTE — Telephone Encounter (Signed)
[facility] was advised of below .  Script to be faxed to [facility] .  Spoke to [facility] they  drew 5 liters of fluid  from abdominal paracentesis .  Do you recommend that patient   take potassium she has on hand 20 meq at home?   Patient is going to Gyn now to get new pessary .   Per [facility] script for roxicodone must say Hospice patient

## 2017-12-05 NOTE — Telephone Encounter (Addendum)
Script faxed for roxicodone . Did you see message regarding below ? Mitzie advised her to address Gatorade

## 2017-12-05 NOTE — Telephone Encounter (Signed)
Will send in roxicodone.   Please advise for respiratory depression, allergic reaction.   Let us know how she is doing  Case reviewed with supervising, Dr Duncan Dulleresa Tullo, and she and I jointly agreed on management plan.

## 2017-12-05 NOTE — Progress Notes (Signed)
HPI:      [name] is a 77 y.o.  She presents today because her pessary which was replaced yesterday has fallen out. The pessary was cleaned and replaced. It fell out twice more in the office, but the third placement was successful. The patient's hospice nurse was present for her visit. The patient's foley catheter was replaced with the hospice nurse under sterile technique.  Patient's discharge had resolved from her previous visit this week.   PMHx: She  has a past medical history of GERD (gastroesophageal reflux disease), Hypertension, Kidney stones, SVT (supraventricular tachycardia) (HCC), and UTI (urinary tract infection). Also,  has a past surgical history that includes Abdominal hysterectomy and Esophagogastroduodenoscopy (egd) with propofol (N/A, 01/27/2017)., family history includes Cancer (age of onset: 4030) in her sister; Cancer (age of onset: 8488) in her mother; Diabetes in her brother; Hypertension in her brother, father, and mother; Pneumonia in her father.,  reports that  has never smoked. she has never used smokeless tobacco. She reports that she does not drink alcohol or use drugs.  She has a current medication list which includes the following prescription(s): alum & mag hydroxide-simeth, clorazepate, conjugated estrogens, diphenhydramine, docusate sodium, fluconazole, hydrocortisone, hydrocortisone, hydrocortisone cream, preservision areds 2+multi vit, omeprazole, oxybutynin, oxycodone, polyethylene glycol, polyvinyl alcohol, rifaximin, sulfamethoxazole-trimethoprim, and witch hazel-glycerin. Also, has No Known Allergies.  Review of Systems  Constitutional: Negative for chills, fever, malaise/fatigue and weight loss.  HENT: Negative for congestion, hearing loss and sinus pain.   Eyes: Negative for blurred vision and double vision.  Respiratory: Negative for cough, sputum production, shortness of breath and wheezing.   Cardiovascular: Negative for chest pain, palpitations,  orthopnea and leg swelling.  Gastrointestinal: Negative for abdominal pain, constipation, diarrhea, nausea and vomiting.  Genitourinary: Negative for dysuria, flank pain, frequency, hematuria and urgency.  Musculoskeletal: Negative for back pain, falls and joint pain.  Skin: Negative for itching and rash.  Neurological: Negative for dizziness and headaches.  Psychiatric/Behavioral: Negative for depression, substance abuse and suicidal ideas. The patient is not nervous/anxious.     Objective: There were no vitals taken for this visit. Physical Exam  Constitutional: She is oriented to person, place, and time. She appears well-developed.  Genitourinary: Vagina normal and uterus normal. There is no lesion on the right labia. There is no lesion on the left labia. Vagina exhibits no lesion. Right adnexum does not display mass. Left adnexum does not display mass. Cervix does not exhibit motion tenderness.  Genitourinary Comments: Total prolapse of vaginal cuff, cystocele, rectocele, enterocele  HENT:  Head: Normocephalic and atraumatic.  Eyes: EOM are normal.  Neck: Neck supple. No thyromegaly present.  Cardiovascular: Normal rate, regular rhythm and normal heart sounds.  Pulmonary/Chest: Effort normal and breath sounds normal. Right breast exhibits no inverted nipple, no mass, no nipple discharge and no skin change. Left breast exhibits no inverted nipple, no mass, no nipple discharge and no skin change.  Abdominal: Bowel sounds are normal. She exhibits no distension and no mass.  Small distension from ascites.   Neurological: She is alert and oriented to person, place, and time.  Skin: Skin is warm and dry.  Psychiatric: She has a normal mood and affect. Her behavior is normal. Judgment and thought content normal.  Vitals reviewed.  A/P:  Pessary was cleaned and replaced today. Instructions given for care. Concerning symptoms to observe for are counseled to patient. Follow up  scheduled. Continue to replace pessary as needed. Will order Gellhorn and donut pessary  size 7 which could also be tried if this pessary fails again. Replace foley catheter every 30 days.  Adelene Idler MD [facility], [facility] 12/07/2017  1:17 PM

## 2017-12-05 NOTE — Telephone Encounter (Signed)
Mitzie from Hospice called with the same information below-  PT had allergic reaction to hydrocodone- tongue swelling, itchy  She is requesting morphine for the pt.  Her contact number is [phone]

## 2017-12-08 ENCOUNTER — Ambulatory Visit: Payer: Medicare Other

## 2017-12-08 ENCOUNTER — Telehealth: Payer: Self-pay | Admitting: Family

## 2017-12-08 LAB — NUSWAB BV AND CANDIDA, NAA
CANDIDA ALBICANS, NAA: NEGATIVE
CANDIDA GLABRATA, NAA: NEGATIVE

## 2017-12-08 MED ORDER — LACTULOSE 20 GM/30ML PO SOLN: ORAL | 3 refills | Status: DC

## 2017-12-08 NOTE — Telephone Encounter (Signed)
Call pt  I dont have a problem with her drinking gatorade however I would advise moderation. Is she concerned about electrolytes?   With her cirrhosis and kidney disease, she needs to be limit salt, water, and protein.   Does she feel like she has had education regarding diet? I can message GI and see if we can get patient handout for her so we are all on the same page.

## 2017-12-08 NOTE — Telephone Encounter (Signed)
Missy number [phone]

## 2017-12-08 NOTE — Telephone Encounter (Signed)
Missy  Of Hospice notified and medication faxed as requested.

## 2017-12-08 NOTE — Telephone Encounter (Addendum)
Patient having constipation and with Rectocele and pessary patient GYN recommendation no suppositories due to these conditions MIssy from hospice is scared to use enema and patient has stool softener, not working,. Missy ask since Rifaximin 550 mg BID was working can this medication be increased? If not what can be done for constipation. Plus patient also has Hemorrhoid on out side of rectum.

## 2017-12-08 NOTE — Telephone Encounter (Signed)
I was not aware that rifaxamin was used for constipation ,  Only for diarrhea and hepatic encephalopathy. .  I can send an rx for lactulose but I will print it out for you to send wherever

## 2017-12-08 NOTE — Telephone Encounter (Signed)
The pt and her son Carmelia RollerDeWayne called to get message from Rennie PlowmanMargaret Arnett; per [name], "I dont have a problem with her drinking gatorade however I would advise moderation. Is she concerned about electrolytes?" She was leg cramps; "With her cirrhosis and kidney disease, she needs to be limit salt, water, and protein. Does she feel like she has had education regarding diet? I can message GI and see if we can get patient handout for her so we are all on the same page."; the pt and her son would like education regarding diet; the pt's son, Carmelia RollerDeWayne can be reached at [phone]; [facility].

## 2017-12-08 NOTE — Telephone Encounter (Signed)
Left voice mail to call back ok for [facility] to speak to patient 

## 2017-12-09 ENCOUNTER — Telehealth: Payer: Self-pay | Admitting: Family

## 2017-12-09 NOTE — Telephone Encounter (Signed)
Sin is returning call - Susan Greer [phone]

## 2017-12-09 NOTE — Telephone Encounter (Signed)
Call son- reviewing note when mom was seen by liver clinic at [facility] in January  Dr. Waynard ReedsSegovia printed the following for patient.   I have copied the below - feel free to mail if helpful.   Follow a low salt diet - less than 2,000 mg of sodium per day  High protein diet : 80 -100 grams of protein per day  You can try drinking Ensure or Boost  I would NOT recommend that you get the prolapse surgery at this time

## 2017-12-09 NOTE — Telephone Encounter (Signed)
Left voice mail to call back ok for [facility] to speak to patient 

## 2017-12-09 NOTE — Telephone Encounter (Signed)
Reviewed urine culture results;  UC sensitive to septra

## 2017-12-10 ENCOUNTER — Telehealth: Payer: Self-pay

## 2017-12-10 NOTE — Telephone Encounter (Signed)
Dgtr in law states Dr Jerene PitchSchuman contacted them this a.m. To check on patient. She wants to inquire about Dr's thoughts on the catheter that she has in. She had UTI before she was hospitalized & is still being treated for it. After this new catheter was put in by hospice nurse while in our office, patient has had a lot of leaking. Do you believe the catheter should remain or if it possibly isn't placed properly. Cb#[id number]

## 2017-12-10 NOTE — Progress Notes (Signed)
Spoke with patients son Susan Greer and advised of below

## 2017-12-10 NOTE — Telephone Encounter (Signed)
Spoke with Susan Greer patients son and he states patient now having tenderness in mouth , taking fluids in . Problem chewing , however eating soft foods and soups.  Now have bowel movements.

## 2017-12-10 NOTE — Telephone Encounter (Signed)
Returned phone call to family.

## 2017-12-10 NOTE — Telephone Encounter (Signed)
After talking with kristen, patient eating and drinking. Pleased constipation resolved

## 2017-12-11 ENCOUNTER — Telehealth: Payer: Self-pay | Admitting: Family

## 2017-12-11 ENCOUNTER — Other Ambulatory Visit: Payer: Self-pay | Admitting: Nephrology

## 2017-12-11 ENCOUNTER — Other Ambulatory Visit: Payer: Self-pay | Admitting: Obstetrics and Gynecology

## 2017-12-11 DIAGNOSIS — N8110 Cystocele, unspecified: Secondary | ICD-10-CM

## 2017-12-11 DIAGNOSIS — N761 Subacute and chronic vaginitis: Secondary | ICD-10-CM

## 2017-12-11 DIAGNOSIS — R188 Other ascites: Secondary | ICD-10-CM

## 2017-12-11 MED ORDER — FLUCONAZOLE 150 MG PO TABS: 150.0000 mg | ORAL_TABLET | Freq: Every day | ORAL | 3 refills | Status: DC

## 2017-12-11 MED ORDER — DOUCHE/ENEMA/WATER BOTTLE SYS MISC: 1.0000 | Freq: Every day | 1 refills | Status: AC

## 2017-12-11 MED ORDER — POVIDONE-IODINE 0.3 % VA SOLN: 1.0000 | Freq: Every day | VAGINAL | 6 refills | Status: DC

## 2017-12-11 MED ORDER — TERCONAZOLE 0.8 % VA CREA: 1.0000 | TOPICAL_CREAM | VAGINAL | 2 refills | Status: DC

## 2017-12-11 NOTE — Telephone Encounter (Signed)
Not a [facility] patient.

## 2017-12-11 NOTE — Telephone Encounter (Signed)
This is a patient of [name].  Please clarify exactly what is going on and what they need.  Thanks

## 2017-12-11 NOTE — Telephone Encounter (Signed)
Copied from CRM [phone]. Topic: Quick Communication - See Telephone Encounter >> Dec 11, 2017 11:46 AM Cipriano BunkerLambe, Annette S wrote: CRM for notification. See Telephone encounter for:   [facility] [phone] GYN, Dr.  Jerene PitchSchuman requested she take diflucan once a month. She is also asking for verbal to get magic mouth wash to help mouth.    Needs Verbal  12/11/17.

## 2017-12-11 NOTE — Telephone Encounter (Signed)
Spoke to eBayMitzi, hospice nurse, foley catheter had already been removed and pt doing well. Mitzi aware of plan for once daily betadine douche and once weekly terazol. Hospice requests prescription for OTC supplies in order for them to be paid for. Please send rx for betadine, douche applicator and terazol 3. Thank you.

## 2017-12-11 NOTE — Telephone Encounter (Signed)
Please advise 

## 2017-12-11 NOTE — Telephone Encounter (Signed)
Called family and discussed plan of care with betadine douche and once a week Terazol 3 ointment.

## 2017-12-11 NOTE — Telephone Encounter (Signed)
Susan AngstChris Mackley calling to report pt is still having a lot of d/c coming out. She is following up to see if the rx was sent in for the Yeast infection. She is reporting that patient is still having pain/soreness in the whole area (catheter & vaginal area). She is inquiring if they should get the hospice nurse to remove the catheter. [phone]#[phone] ([name]'s house).

## 2017-12-11 NOTE — Telephone Encounter (Signed)
Hospice needing verbals for diflucan once a month and magic mouth wash. Mitzi stated that this was not urgent and could wait until [name] returns tomorrow. Forwarding back to HenriettaKristen.

## 2017-12-12 ENCOUNTER — Ambulatory Visit
Admission: RE | Admit: 2017-12-12 | Discharge: 2017-12-12 | Disposition: A | Discharge status: Home / Self Care | Source: Ambulatory Visit | Attending: Nephrology | Admitting: Nephrology

## 2017-12-12 DIAGNOSIS — R188 Other ascites: Secondary | ICD-10-CM | POA: Insufficient documentation

## 2017-12-12 MED ORDER — MAGIC MOUTHWASH W/LIDOCAINE: 5.0000 mL | Freq: Three times a day (TID) | ORAL | 4 refills | Status: AC | PRN

## 2017-12-12 NOTE — Telephone Encounter (Addendum)
Verbal orders given for magic mouth wash to Mitzi.  Advised Mitzi [facility]osp[facility] RN script for Diflucan was sent into [facility].   Will fax hardcopy script for Diflucan into [facility].

## 2017-12-12 NOTE — Procedures (Signed)
PROCEDURE SUMMARY:  Successful US guided paracentesis from RLQ.  Yielded 4 L of clear yellow fluid.  No immediate complications.  Pt tolerated well.   Specimen was not sent for labs.  Brayton ElBRUNING, Tyffany Waldrop PA-C 12/12/2017 11:33 AM

## 2017-12-19 ENCOUNTER — Ambulatory Visit
Admission: RE | Admit: 2017-12-19 | Discharge: 2017-12-19 | Disposition: A | Discharge status: Home / Self Care | Source: Ambulatory Visit | Attending: Nephrology | Admitting: Nephrology

## 2017-12-19 ENCOUNTER — Ambulatory Visit: Payer: Medicare Other

## 2017-12-19 DIAGNOSIS — R188 Other ascites: Secondary | ICD-10-CM | POA: Insufficient documentation

## 2017-12-19 MED ORDER — ALBUMIN HUMAN 25 % IV SOLN
INTRAVENOUS | Status: AC
  Administered 2017-12-19: 25 g via INTRAVENOUS
  Filled 2017-12-19: qty 100

## 2017-12-19 MED ORDER — ALBUMIN HUMAN 25 % IV SOLN
25.0000 g | Freq: Once | INTRAVENOUS | Status: AC
  Administered 2017-12-19: 25 g via INTRAVENOUS
  Filled 2017-12-19: qty 100

## 2017-12-19 MED ORDER — ALBUMIN HUMAN 25 % IV SOLN: 12.5000 g | Freq: Once | INTRAVENOUS | Status: DC

## 2017-12-19 NOTE — Procedures (Signed)
Ultrasound-guided  therapeutic paracentesis performed yielding 4 liters (maximum ordered) of slightly hazy, yellow fluid. No immediate complications. The pt will receive IV albumin postprocedure.

## 2017-12-19 NOTE — Discharge Instructions (Signed)
Paracentesis °Paracentesis is a procedure to remove excess fluid (ascites) from the belly (abdomen). Ascites can result from certain conditions, such as infection, inflammation, abdominal injury, heart failure, chronic scarring of the liver (cirrhosis), or cancer. Ascites is removed using a needle that is inserted through the skin and tissue into the abdomen. °This procedure may be done: °· To determine the cause of the ascites. °· To relieve symptoms that are caused by the ascites, such as pain or shortness of breath. °· To see if there is bleeding after an abdominal injury. ° °Tell a health care provider about: °· Any allergies you have. °· All medicines you are taking, including vitamins, herbs, eye drops, creams, and over-the-counter medicines. °· Any problems you or family members have had with anesthetic medicines. °· Any blood disorders you have. °· Any surgeries you have had. °· Any medical conditions you have. °· Whether you are pregnant or may be pregnant. °What are the risks? °Generally, this is a safe procedure. However, problems may occur, including: °· Infection. °· Bleeding. °· Injury to an abdominal organ, such as the bowel (large intestine), liver, spleen, or bladder. °· Low blood pressure (hypotension). °· Spreading of cancer, if there are cancer cells in the abdominal fluid. °· Mental status changes in people who have liver disease. These changes would be caused by shifts in the balance of fluids and minerals (electrolytes) in the body. ° °What happens before the procedure? °· Ask your health care provider about: °? Changing or stopping your regular medicines. This is especially important if you are taking diabetes medicines or blood thinners. °? Taking medicines such as aspirin and ibuprofen. These medicines can thin your blood. Do not take these medicines before your procedure if your health care provider instructs you not to. °· A blood sample may be done to determine your blood clotting  time. °· You will be asked to urinate. °What happens during the procedure? °· You may be asked to lie on your back with your head raised (elevated). °· To reduce your risk of infection: °? Your health care team will wash or sanitize their hands. °? Your skin will be washed with soap. °· You will be given a medicine to numb the area (local anesthetic). °· Your abdominal skin will be punctured with a needle or a scalpel. °· A drainage tube will be inserted through the puncture site. Fluid will drain through the tube into a container. °· After enough fluid has been removed, the tube will be removed. °· A sample of the fluid will be sent for examination. °· A bandage (dressing) will be placed over the puncture site. °The procedure may vary among health care providers and hospitals. °What happens after the procedure? °· It is your responsibility to get your test results. Ask your health care provider or the department performing the test when your results will be ready. °This information is not intended to replace advice given to you by your health care provider. Make sure you discuss any questions you have with your health care provider. °Document Released: 04/22/2005 Document Revised: 03/14/2016 Document Reviewed: 12/20/2014 °Elsevier Interactive Patient Education © 2018 Elsevier Inc. ° °

## 2017-12-22 ENCOUNTER — Ambulatory Visit (INDEPENDENT_AMBULATORY_CARE_PROVIDER_SITE_OTHER): Admitting: Obstetrics and Gynecology

## 2017-12-22 ENCOUNTER — Ambulatory Visit: Payer: Medicare Other

## 2017-12-22 ENCOUNTER — Encounter: Payer: Self-pay | Admitting: Obstetrics and Gynecology

## 2017-12-22 VITALS — BP 110/70 | HR 106 | Ht 63.0 in | Wt 98.0 lb

## 2017-12-22 DIAGNOSIS — N8110 Cystocele, unspecified: Secondary | ICD-10-CM

## 2017-12-22 DIAGNOSIS — K623 Rectal prolapse: Secondary | ICD-10-CM

## 2017-12-22 MED ORDER — BENZOCAINE-BENZETHONIUM 20-0.2 % EX AERO: 1.0000 | INHALATION_SPRAY | Freq: Four times a day (QID) | CUTANEOUS | 11 refills | Status: DC | PRN

## 2017-12-22 MED ORDER — TERCONAZOLE 0.8 % VA CREA: 20.0000 | TOPICAL_CREAM | VAGINAL | 2 refills | Status: DC

## 2017-12-22 NOTE — Progress Notes (Signed)
Patient ID: Susan Greer, female   DOB: 10-03-1941, 77 y.o.   MRN: [id number]  Reason for Consult: Follow-up (rectal prolapse)   Referred by Allegra GranaArnett, Margaret G, FNP  Subjective:     HPI:  Susan Greer is a 77 y.o. female. She is in hospice care and is being followed for prolapse. She has a pessary in place which has been in place for more than a week without issue. She was having heavy malodorous discharge, but this has been improved with daily sitz baths. The patient did have a foley in place but this was uncomfortable. The foley was removed and she has been managing well without it. She reports that she is having rectal discomfort from hemorrhoid and rectal prolapse. She reports that she is having easy daily bowel movements with the help of colace. She dies not have constipation. She has not had rectal bleeding.  She reports that the pain is with ambulation and sitting. She has been using hemorrhoid cream. She was using suppositories, but stopped this and does feel like this has improved her situation.  She was treated with terazol for a yeast infection last week. There was confusion with my orders. She took all three dose in a row and not just one dose weekly. I reviewed with the family my management plan for her which is once a week terazol, daily vaginal douching with betadine.   Past Medical History:  Diagnosis Date  . GERD (gastroesophageal reflux disease)   . Hypertension   . Kidney stones   . SVT (supraventricular tachycardia) (HCC)    Dr. Lady [facility]Fath at [facility], s/p adenosine  . UTI (urinary tract infection)    Family History  Problem Relation Age of Onset  . Hypertension Mother   . Cancer Mother 6988       breast  . Hypertension Father   . Pneumonia Father   . Diabetes Brother   . Hypertension Brother   . Cancer Sister 2530       breast   Past Surgical History:  Procedure Laterality Date  . ABDOMINAL HYSTERECTOMY     Dr. Weston AnnaEllington, for fibroid tumor and endometriosis  .  ESOPHAGOGASTRODUODENOSCOPY (EGD) WITH PROPOFOL N/A 01/27/2017   Procedure: ESOPHAGOGASTRODUODENOSCOPY (EGD) WITH PROPOFOL;  Surgeon: Scot Junobert T Elliott, MD;  [facility];  Service: Endoscopy;  Laterality: N/A;    Short Social History:  Social History   Tobacco Use  . Smoking status: Never Smoker  . Smokeless tobacco: Never Used  Substance Use Topics  . Alcohol use: No    No Known Allergies  Current Outpatient Medications  Medication Sig Dispense Refill  . alum & mag hydroxide-simeth (MAALOX/MYLANTA) 200-200-20 MG/5ML suspension Take 30 mLs by mouth every 6 (six) hours as needed for indigestion or heartburn. 355 mL 0  . Benzocaine-Benzethonium (DERMOPLAST ANTIBACTERIAL) 20-0.2 % AERO Apply 1 spray topically 4 (four) times daily as needed. 1 Can 11  . clorazepate (TRANXENE) 7.5 MG tablet Take 7.5 mg by mouth 2 (two) times daily as needed for anxiety.    . clotrimazole (MYCELEX) 10 MG troche     . conjugated estrogens (PREMARIN) vaginal cream Place 1 Applicatorful vaginally daily. 42.5 g 12  . diphenhydrAMINE (BENADRYL) 25 mg capsule Take 1 capsule (25 mg total) by mouth every 8 (eight) hours as needed for itching. 30 capsule 0  . docusate sodium (COLACE) 100 MG capsule Take 1 capsule (100 mg total) by mouth 2 (two) times daily as needed for mild constipation. 10  capsule 0  . fluconazole (DIFLUCAN) 150 MG tablet Take 1 tablet (150 mg total) by mouth daily. 2 tablet 3  . hydrocortisone (ANUSOL-HC) 25 MG suppository Place 1 suppository (25 mg total) rectally 2 (two) times daily. 12 suppository 0  . hydrocortisone cream 0.5 % Apply topically 3 (three) times daily. 30 g 0  . Multiple Vitamins-Minerals (PRESERVISION AREDS 2+MULTI VIT) CAPS Take 1 capsule by mouth 2 (two) times daily.    Marland Kitchen omeprazole (PRILOSEC) 40 MG capsule Take 40 mg by mouth daily.     Marland Kitchen oxyCODONE (ROXICODONE) 5 MG immediate release tablet Take 1 tablet (5 mg total) by mouth every 4 (four) hours as needed for severe  pain. 30 tablet 0  . polyethylene glycol (MIRALAX / GLYCOLAX) packet Take 17 g by mouth daily as needed for mild constipation. 14 each 0  . polyvinyl alcohol (LIQUIFILM TEARS) 1.4 % ophthalmic solution Place 1 drop into both eyes as needed for dry eyes. 15 mL 0  . povidone-iodine (BETADINE) 0.3 % SOLN Place 133 mLs (1 each total) vaginally at bedtime. 30 Bottle 6  . rifaximin (XIFAXAN) 550 MG TABS tablet Take 550 mg by mouth 2 (two) times daily.     Costella Hatcher Goods (DOUCHE/ENEMA/WATER BOTTLE SYS) MISC 1 applicator by Does not apply route at bedtime. 10 each 1  . terconazole (TERAZOL 3) 0.8 % vaginal cream Place 20 applicators vaginally once a week. 20 g 2  . witch hazel-glycerin (TUCKS) pad Apply topically as needed for itching. 40 each 12   No current facility-administered medications for this visit.     REVIEW OF SYSTEMS      Objective:  Objective   Vitals:   12/22/17 1506  BP: 110/70  Pulse: (!) 106  Weight: 98 lb (44.5 kg)  Height: 5\' 3"  (1.6 m)   Body mass index is 17.36 kg/m.  Physical Exam       Assessment/Plan:     Vaginal Prolapse and maloderous vaginal discharge: Pessary in place, in 3 months will remove and clean pessary. Until then recommend once a week terazol, daily vaginal douching with betadine, daily sitz baths, daily vaginal estrogen cream. * Difficulty obtaining betadine douche through pharmacy, Rosanne Sack is calling pharmacies to find douche* * Prescription for terazol and applicators resent. *   Rectal prolapse: Continue colace. May take miralax if this helps prevent constipation. Encouraged fiber rich diet and hydration. Discussed with family that the prolapsing rectum can be tucked inside with a finger gently and then a Vaseline gauze can be placed outside the rectum for comfort. Discussed a donut for seating.  Surgical managment is not an option. Discussed with patient and family kegel exercises to strengthen muscles of the pelvic floor. Okay for TUCKS pads,  Avoid suppositories.      Natale Milch MD [facility] 12/25/17 5:40 PM

## 2017-12-26 ENCOUNTER — Ambulatory Visit
Admission: RE | Admit: 2017-12-26 | Discharge: 2017-12-26 | Disposition: A | Discharge status: Home / Self Care | Source: Ambulatory Visit | Attending: Nephrology | Admitting: Nephrology

## 2017-12-26 DIAGNOSIS — R188 Other ascites: Secondary | ICD-10-CM | POA: Insufficient documentation

## 2017-12-26 MED ORDER — ALBUMIN HUMAN 25 % IV SOLN
25.0000 g | Freq: Once | INTRAVENOUS | Status: AC
Start: 2017-12-26 — End: 2017-12-26
  Administered 2017-12-26: 25 g via INTRAVENOUS
  Filled 2017-12-26: qty 100

## 2017-12-26 MED ORDER — ALBUMIN HUMAN 25 % IV SOLN
INTRAVENOUS | Status: AC
  Administered 2017-12-26: 25 g via INTRAVENOUS
  Filled 2017-12-26: qty 100

## 2017-12-26 NOTE — Procedures (Signed)
Interventional Radiology Procedure Note  Procedure: US guided paracentesis.  Complications: None Recommendations:  - Ok to shower tomorrow - Do not submerge for 7 days - Routine care   Signed,  Aleecia Tapia S. Kimberlyn Quiocho, DO    

## 2017-12-29 ENCOUNTER — Ambulatory Visit: Payer: Medicare Other

## 2018-01-02 ENCOUNTER — Ambulatory Visit: Payer: Medicare Other

## 2018-01-02 ENCOUNTER — Ambulatory Visit
Admission: RE | Admit: 2018-01-02 | Discharge: 2018-01-02 | Disposition: A | Discharge status: Home / Self Care | Source: Ambulatory Visit | Attending: Nephrology | Admitting: Nephrology

## 2018-01-02 DIAGNOSIS — R188 Other ascites: Secondary | ICD-10-CM | POA: Insufficient documentation

## 2018-01-02 MED ORDER — ALBUMIN HUMAN 25 % IV SOLN
INTRAVENOUS | Status: AC
  Administered 2018-01-02: 25 g via INTRAVENOUS
  Filled 2018-01-02: qty 100

## 2018-01-02 MED ORDER — ALBUMIN HUMAN 25 % IV SOLN
25.0000 g | Freq: Once | INTRAVENOUS | Status: AC
Start: 2018-01-02 — End: 2018-01-02
  Administered 2018-01-02: 25 g via INTRAVENOUS
  Filled 2018-01-02: qty 100

## 2018-01-02 NOTE — Procedures (Signed)
PROCEDURE SUMMARY:  Successful US guided paracentesis from right lateral abdomen.  Yielded 4 liters of clear yellow fluid.  No immediate complications.  Pt tolerated well.    Luismario Coston S Shir Bergman PA-C 01/02/2018 12:29 PM

## 2018-01-05 ENCOUNTER — Ambulatory Visit: Payer: Medicare Other | Admitting: Obstetrics and Gynecology

## 2018-01-06 ENCOUNTER — Other Ambulatory Visit: Payer: Self-pay | Admitting: Internal Medicine

## 2018-01-06 DIAGNOSIS — R188 Other ascites: Secondary | ICD-10-CM

## 2018-01-06 NOTE — Discharge Instructions (Signed)
Paracentesis, Care After °Refer to this sheet in the next few weeks. These instructions provide you with information about caring for yourself after your procedure. Your health care provider may also give you more specific instructions. Your treatment has been planned according to current medical practices, but problems sometimes occur. Call your health care provider if you have any problems or questions after your procedure. °What can I expect after the procedure? °After your procedure, it is common to have a small amount of clear fluid coming from the puncture site. °Follow these instructions at home: °· Return to your normal activities as told by your health care provider. Ask your health care provider what activities are safe for you. °· Take over-the-counter and prescription medicines only as told by your health care provider. °· Do not take baths, swim, or use a hot tub until your health care provider approves. °· Follow instructions from your health care provider about: °? How to take care of your puncture site. °? When and how you should change your bandage (dressing). °? When you should remove your dressing. °· Check your puncture area every day signs of infection. Watch for: °? Redness, swelling, or pain. °? Fluid, blood, or pus. °· Keep all follow-up visits as told by your health care provider. This is important. °Contact a health care provider if: °· You have redness, swelling, or pain at your puncture site. °· You start to have more clear fluid coming from your puncture site. °· You have blood or pus coming from your puncture site. °· You have chills. °· You have a fever. °Get help right away if: °· You develop chest pain or shortness of breath. °· You develop increasing pain, discomfort, or swelling in your abdomen. °· You feel dizzy or light-headed or you pass out. °This information is not intended to replace advice given to you by your health care provider. Make sure you discuss any questions you  have with your health care provider. °Document Released: 02/21/2015 Document Revised: 03/14/2016 Document Reviewed: 12/20/2014 °Elsevier Interactive Patient Education © 2018 Elsevier Inc. ° °

## 2018-01-08 ENCOUNTER — Other Ambulatory Visit: Payer: Self-pay | Admitting: Nephrology

## 2018-01-08 ENCOUNTER — Ambulatory Visit
Admission: RE | Admit: 2018-01-08 | Discharge: 2018-01-08 | Disposition: A | Discharge status: Home / Self Care | Source: Ambulatory Visit | Attending: Internal Medicine | Admitting: Internal Medicine

## 2018-01-08 DIAGNOSIS — R188 Other ascites: Secondary | ICD-10-CM | POA: Diagnosis present

## 2018-01-08 MED ORDER — ALBUMIN HUMAN 25 % IV SOLN
25.0000 g | Freq: Once | INTRAVENOUS | Status: AC
  Administered 2018-01-08: 25 g via INTRAVENOUS
  Filled 2018-01-08: qty 100

## 2018-01-08 MED ORDER — ALBUMIN HUMAN 25 % IV SOLN
INTRAVENOUS | Status: AC
  Filled 2018-01-08: qty 100

## 2018-01-08 NOTE — Procedures (Signed)
US guided paracentesis.  Removed 5 liters of yellow fluid without complication.  Minimal blood loss.

## 2018-01-09 ENCOUNTER — Other Ambulatory Visit: Payer: Self-pay | Admitting: Nephrology

## 2018-01-09 DIAGNOSIS — R188 Other ascites: Secondary | ICD-10-CM

## 2018-01-13 ENCOUNTER — Ambulatory Visit
Admission: RE | Admit: 2018-01-13 | Discharge: 2018-01-13 | Disposition: A | Discharge status: Home / Self Care | Source: Ambulatory Visit | Attending: Nephrology | Admitting: Nephrology

## 2018-01-13 DIAGNOSIS — R188 Other ascites: Secondary | ICD-10-CM | POA: Diagnosis present

## 2018-01-13 MED ORDER — ALBUMIN HUMAN 25 % IV SOLN
25.0000 g | Freq: Once | INTRAVENOUS | Status: AC
  Administered 2018-01-13: 25 g via INTRAVENOUS
  Filled 2018-01-13: qty 100

## 2018-01-13 MED ORDER — ALBUMIN HUMAN 25 % IV SOLN
INTRAVENOUS | Status: AC
  Administered 2018-01-13: 25 g via INTRAVENOUS
  Filled 2018-01-13: qty 100

## 2018-01-13 NOTE — Procedures (Signed)
PROCEDURE SUMMARY:  Successful US guided paracentesis from right lateral abdomen.  Yielded 4 liters of clear yellow fluid.  No immediate complications.  Patient tolerated well.   Carmellia Kreisler S Kieran Nachtigal PA-C 01/13/2018 10:31 AM

## 2018-01-15 ENCOUNTER — Other Ambulatory Visit: Payer: Self-pay | Admitting: Nephrology

## 2018-01-15 ENCOUNTER — Ambulatory Visit: Payer: Medicare Other

## 2018-01-15 DIAGNOSIS — R188 Other ascites: Secondary | ICD-10-CM

## 2018-01-16 ENCOUNTER — Ambulatory Visit
Admission: RE | Admit: 2018-01-16 | Discharge: 2018-01-16 | Disposition: A | Discharge status: Home / Self Care | Source: Ambulatory Visit | Attending: Nephrology | Admitting: Nephrology

## 2018-01-16 DIAGNOSIS — R188 Other ascites: Secondary | ICD-10-CM | POA: Diagnosis not present

## 2018-01-16 MED ORDER — ALBUMIN HUMAN 25 % IV SOLN
INTRAVENOUS | Status: AC
  Filled 2018-01-16: qty 100

## 2018-01-16 MED ORDER — ALBUMIN HUMAN 25 % IV SOLN
25.0000 g | Freq: Once | INTRAVENOUS | Status: AC
Start: 2018-01-16 — End: 2018-01-16
  Administered 2018-01-16: 25 g via INTRAVENOUS
  Filled 2018-01-16: qty 100

## 2018-01-16 MED ORDER — SODIUM CHLORIDE FLUSH 0.9 % IV SOLN
INTRAVENOUS | Status: AC
  Filled 2018-01-16: qty 10

## 2018-01-16 NOTE — Procedures (Signed)
Ultrasound-guided  therapeutic paracentesis performed yielding 4 liters (maximum ordered) of hazy,yellow fluid. No immediate complications.  The patient will receive IV albumin infusion postprocedure.

## 2018-01-19 ENCOUNTER — Ambulatory Visit: Admission: RE | Admit: 2018-01-19 | Payer: Medicare Other | Source: Ambulatory Visit

## 2018-01-19 ENCOUNTER — Ambulatory Visit
Admission: RE | Admit: 2018-01-19 | Discharge: 2018-01-19 | Disposition: A | Discharge status: Home / Self Care | Source: Ambulatory Visit | Attending: Nephrology | Admitting: Nephrology

## 2018-01-19 DIAGNOSIS — R188 Other ascites: Secondary | ICD-10-CM | POA: Diagnosis not present

## 2018-01-19 MED ORDER — ALBUMIN HUMAN 25 % IV SOLN: 25.0000 g | Freq: Once | INTRAVENOUS | Status: DC

## 2018-01-19 MED ORDER — ALBUMIN HUMAN 25 % IV SOLN
INTRAVENOUS | Status: AC
  Filled 2018-01-19: qty 100

## 2018-01-19 NOTE — Procedures (Signed)
US guided paracentesis.  Removed 3.2 liters of cloudy yellow fluid.  Minimal blood loss and no immediate complication.

## 2018-01-20 ENCOUNTER — Other Ambulatory Visit: Payer: Self-pay | Admitting: Nephrology

## 2018-01-20 DIAGNOSIS — R188 Other ascites: Secondary | ICD-10-CM

## 2018-01-20 NOTE — Discharge Instructions (Signed)
Paracentesis, Care After °Refer to this sheet in the next few weeks. These instructions provide you with information about caring for yourself after your procedure. Your health care provider may also give you more specific instructions. Your treatment has been planned according to current medical practices, but problems sometimes occur. Call your health care provider if you have any problems or questions after your procedure. °What can I expect after the procedure? °After your procedure, it is common to have a small amount of clear fluid coming from the puncture site. °Follow these instructions at home: °· Return to your normal activities as told by your health care provider. Ask your health care provider what activities are safe for you. °· Take over-the-counter and prescription medicines only as told by your health care provider. °· Do not take baths, swim, or use a hot tub until your health care provider approves. °· Follow instructions from your health care provider about: °? How to take care of your puncture site. °? When and how you should change your bandage (dressing). °? When you should remove your dressing. °· Check your puncture area every day signs of infection. Watch for: °? Redness, swelling, or pain. °? Fluid, blood, or pus. °· Keep all follow-up visits as told by your health care provider. This is important. °Contact a health care provider if: °· You have redness, swelling, or pain at your puncture site. °· You start to have more clear fluid coming from your puncture site. °· You have blood or pus coming from your puncture site. °· You have chills. °· You have a fever. °Get help right away if: °· You develop chest pain or shortness of breath. °· You develop increasing pain, discomfort, or swelling in your abdomen. °· You feel dizzy or light-headed or you pass out. °This information is not intended to replace advice given to you by your health care provider. Make sure you discuss any questions you  have with your health care provider. °Document Released: 02/21/2015 Document Revised: 03/14/2016 Document Reviewed: 12/20/2014 °Elsevier Interactive Patient Education © 2018 Elsevier Inc. ° °

## 2018-01-22 ENCOUNTER — Ambulatory Visit
Admission: RE | Admit: 2018-01-22 | Discharge: 2018-01-22 | Disposition: A | Discharge status: Home / Self Care | Source: Ambulatory Visit | Attending: Nephrology | Admitting: Nephrology

## 2018-01-22 DIAGNOSIS — R188 Other ascites: Secondary | ICD-10-CM | POA: Diagnosis present

## 2018-01-22 MED ORDER — ALBUMIN HUMAN 25 % IV SOLN
25.0000 g | Freq: Once | INTRAVENOUS | Status: AC
  Administered 2018-01-22: 25 g via INTRAVENOUS
  Filled 2018-01-22: qty 100

## 2018-01-22 MED ORDER — ALBUMIN HUMAN 25 % IV SOLN
INTRAVENOUS | Status: AC
  Administered 2018-01-22: 25 g via INTRAVENOUS
  Filled 2018-01-22: qty 100

## 2018-01-22 NOTE — Discharge Instructions (Signed)
Paracentesis, Care After °Refer to this sheet in the next few weeks. These instructions provide you with information about caring for yourself after your procedure. Your health care provider may also give you more specific instructions. Your treatment has been planned according to current medical practices, but problems sometimes occur. Call your health care provider if you have any problems or questions after your procedure. °What can I expect after the procedure? °After your procedure, it is common to have a small amount of clear fluid coming from the puncture site. °Follow these instructions at home: °· Return to your normal activities as told by your health care provider. Ask your health care provider what activities are safe for you. °· Take over-the-counter and prescription medicines only as told by your health care provider. °· Do not take baths, swim, or use a hot tub until your health care provider approves. °· Follow instructions from your health care provider about: °? How to take care of your puncture site. °? When and how you should change your bandage (dressing). °? When you should remove your dressing. °· Check your puncture area every day signs of infection. Watch for: °? Redness, swelling, or pain. °? Fluid, blood, or pus. °· Keep all follow-up visits as told by your health care provider. This is important. °Contact a health care provider if: °· You have redness, swelling, or pain at your puncture site. °· You start to have more clear fluid coming from your puncture site. °· You have blood or pus coming from your puncture site. °· You have chills. °· You have a fever. °Get help right away if: °· You develop chest pain or shortness of breath. °· You develop increasing pain, discomfort, or swelling in your abdomen. °· You feel dizzy or light-headed or you pass out. °This information is not intended to replace advice given to you by your health care provider. Make sure you discuss any questions you  have with your health care provider. °Document Released: 02/21/2015 Document Revised: 03/14/2016 Document Reviewed: 12/20/2014 °Elsevier Interactive Patient Education © 2018 Elsevier Inc. ° °

## 2018-01-22 NOTE — Procedures (Signed)
  Procedure: US paracentesis RLQ EBL:   minimal Complications:  none immediate  See full dictation in [facility] PACS.  D. Hatem Cull MD Main # [phone] Pager  [phone]    

## 2018-01-23 ENCOUNTER — Ambulatory Visit (INDEPENDENT_AMBULATORY_CARE_PROVIDER_SITE_OTHER): Admitting: Vascular Surgery

## 2018-01-23 ENCOUNTER — Telehealth: Payer: Self-pay | Admitting: Family

## 2018-01-23 ENCOUNTER — Encounter (INDEPENDENT_AMBULATORY_CARE_PROVIDER_SITE_OTHER): Payer: Self-pay | Admitting: Vascular Surgery

## 2018-01-23 VITALS — BP 115/65 | HR 103 | Resp 12 | Ht 63.0 in | Wt 99.0 lb

## 2018-01-23 DIAGNOSIS — K7460 Unspecified cirrhosis of liver: Secondary | ICD-10-CM

## 2018-01-23 DIAGNOSIS — N390 Urinary tract infection, site not specified: Secondary | ICD-10-CM

## 2018-01-23 DIAGNOSIS — R188 Other ascites: Secondary | ICD-10-CM

## 2018-01-23 DIAGNOSIS — I10 Essential (primary) hypertension: Secondary | ICD-10-CM | POA: Diagnosis not present

## 2018-01-23 DIAGNOSIS — N185 Chronic kidney disease, stage 5: Secondary | ICD-10-CM | POA: Insufficient documentation

## 2018-01-23 MED ORDER — AMOXICILLIN-POT CLAVULANATE 875-125 MG PO TABS: 1.0000 | ORAL_TABLET | Freq: Two times a day (BID) | ORAL | 0 refills | Status: DC

## 2018-01-23 MED ORDER — FOSFOMYCIN TROMETHAMINE 3 G PO PACK: 3.0000 g | PACK | Freq: Once | ORAL | 0 refills | Status: AC

## 2018-01-23 NOTE — Assessment & Plan Note (Signed)
Creating marked ascites requiring multiple paracentesis weekly.  Peritoneal dialysis catheter would potentially address this issue as well.

## 2018-01-23 NOTE — Telephone Encounter (Signed)
Spoke with Susan Greer, advised by Dr Jerene PitchSchuman to order urine.   On rifaximin.  Per nurse, ax0x4. Example given that she will say something out of context, such getting days of when procedures are confused. Gradual over last couple of weeks.    C & S, not sensitive to bactrim per nurse.   Spoke with patient and son, she does not feel confused. Feels tingling with burning  Sent in monurol one time dose and told her to take if gets worse as unable to see UC ( disucssed with son is this infection versus colonization?)  Advised son to NOT pick up augmentin, only to take monurol.  spoke with pharmacy to ensure they know  As well  Of note:     treated with bactrim and diflucan 2/13/ 2019.

## 2018-01-23 NOTE — Progress Notes (Signed)
Patient ID: Susan Greer, female   DOB: [birth year], 77 y.o.   MRN: [id number]  Chief Complaint  Patient presents with  . New Patient (Initial Visit)    Discuss cath placement    HPI Susan Greer is a 77 y.o. female.  I am asked to see the patient by Dr. Holley Raring for evaluation of PD catheter placement.  The patient reports having to get paracentesis twice a week for significant ascites.  She has cirrhosis and multiple other issues.  She has progressive chronic kidney disease and now has a GFR of only 11 consistent with stage V chronic kidney disease.  Dr. Holley Raring is now discussed dialysis with her.  She is adamant that she would not have hemodialysis.  She had a family member who underwent that, and does not want to consider hemodialysis.  Since she is already getting paracentesis twice a week, her hospice nurse told her that she could draw fluid if she had a peritoneal dialysis catheter.  This would also potentially take care of her dialysis needs for her severe chronic kidney disease nearing end-stage renal failure.   Past Medical History:  Diagnosis Date  . GERD (gastroesophageal reflux disease)   . Hypertension   . Kidney stones   . SVT (supraventricular tachycardia) (HCC)    Dr. Ubaldo Glassing at [place], s/p adenosine  . UTI (urinary tract infection)     Past Surgical History:  Procedure Laterality Date  . ABDOMINAL HYSTERECTOMY     Dr. Buford Dresser, for fibroid tumor and endometriosis  . ESOPHAGOGASTRODUODENOSCOPY (EGD) WITH PROPOFOL N/A 01/27/2017   Procedure: ESOPHAGOGASTRODUODENOSCOPY (EGD) WITH PROPOFOL;  Surgeon: Manya Silvas, MD;  Location: [facility];  Service: Endoscopy;  Laterality: N/A;    Family History  Problem Relation Age of Onset  . Hypertension Mother   . Cancer Mother 54       breast  . Hypertension Father   . Pneumonia Father   . Diabetes Brother   . Hypertension Brother   . Cancer Sister 14       breast     Social History Social History   Tobacco  Use  . Smoking status: Never Smoker  . Smokeless tobacco: Never Used  Substance Use Topics  . Alcohol use: No  . Drug use: No     No Known Allergies  Current Outpatient Medications  Medication Sig Dispense Refill  . alum & mag hydroxide-simeth (MAALOX/MYLANTA) 200-200-20 MG/5ML suspension Take 30 mLs by mouth every 6 (six) hours as needed for indigestion or heartburn. 355 mL 0  . Benzocaine-Benzethonium (DERMOPLAST ANTIBACTERIAL) 20-0.2 % AERO Apply 1 spray topically 4 (four) times daily as needed. 1 Can 11  . clorazepate (TRANXENE) 7.5 MG tablet Take 7.5 mg by mouth 2 (two) times daily as needed for anxiety.    . clotrimazole (MYCELEX) 10 MG troche     . conjugated estrogens (PREMARIN) vaginal cream Place 1 Applicatorful vaginally daily. 42.5 g 12  . diphenhydrAMINE (BENADRYL) 25 mg capsule Take 1 capsule (25 mg total) by mouth every 8 (eight) hours as needed for itching. 30 capsule 0  . docusate sodium (COLACE) 100 MG capsule Take 1 capsule (100 mg total) by mouth 2 (two) times daily as needed for mild constipation. 10 capsule 0  . fluconazole (DIFLUCAN) 150 MG tablet Take 1 tablet (150 mg total) by mouth daily. 2 tablet 3  . hydrocortisone (ANUSOL-HC) 25 MG suppository Place 1 suppository (25 mg total) rectally 2 (two) times daily. 12 suppository 0  .  hydrocortisone cream 0.5 % Apply topically 3 (three) times daily. 30 g 0  . Multiple Vitamins-Minerals (PRESERVISION AREDS 2+MULTI VIT) CAPS Take 1 capsule by mouth 2 (two) times daily.    Marland Kitchen omeprazole (PRILOSEC) 40 MG capsule Take 40 mg by mouth daily.     Marland Kitchen oxyCODONE (ROXICODONE) 5 MG immediate release tablet Take 1 tablet (5 mg total) by mouth every 4 (four) hours as needed for severe pain. 30 tablet 0  . polyethylene glycol (MIRALAX / GLYCOLAX) packet Take 17 g by mouth daily as needed for mild constipation. 14 each 0  . polyvinyl alcohol (LIQUIFILM TEARS) 1.4 % ophthalmic solution Place 1 drop into both eyes as needed for dry eyes. 15  mL 0  . rifaximin (XIFAXAN) 550 MG TABS tablet Take 550 mg by mouth 2 (two) times daily.     Marland Kitchen terconazole (TERAZOL 3) 0.8 % vaginal cream Place 20 applicators vaginally once a week. 20 g 2  . witch hazel-glycerin (TUCKS) pad Apply topically as needed for itching. 40 each 12  . povidone-iodine (BETADINE) 0.3 % SOLN Place 133 mLs (1 each total) vaginally at bedtime. 30 Bottle 6   No current facility-administered medications for this visit.       REVIEW OF SYSTEMS (Negative unless checked)  Constitutional: _0 Weight loss  _1 Fever  _2 Chills Cardiac: _3 Chest pain   _4 Chest pressure   _5 Palpitations   _6 Shortness of breath when laying flat   _7 Shortness of breath at rest   _8 Shortness of breath with exertion. Vascular:  _9 Pain in legs with walking   _10 Pain in legs at rest   _11 Pain in legs when laying flat   _12 Claudication   _13 Pain in feet when walking  _14 Pain in feet at rest  _15 Pain in feet when laying flat   _16 History of DVT   _17 Phlebitis   _18 Swelling in legs   _19 Varicose veins   _20 Non-healing ulcers Pulmonary:   _21 Uses home oxygen   _22 Productive cough   _23 Hemoptysis   _24 Wheeze  _25 COPD   _26 Asthma Neurologic:  _27 Dizziness  _28 Blackouts   _29 Seizures   _30 History of stroke   _31 History of TIA  _32 Aphasia   _33 Temporary blindness   _34 Dysphagia   _35 Weakness or numbness in arms   _36 Weakness or numbness in legs Musculoskeletal:  _37 Arthritis   _38 Joint swelling   _39 Joint pain   _40 Low back pain Hematologic:  _41 Easy bruising  _42 Easy bleeding   _43 Hypercoagulable state   _44 Anemic  _45 Hepatitis Gastrointestinal:  _46 Blood in stool   _47 Vomiting blood  _48 Gastroesophageal reflux/heartburn   _49 Abdominal pain Genitourinary:  _50 Chronic kidney disease   _51 Difficult urination  _52 Frequent urination  _53 Burning with urination   _54 Hematuria Skin:  _55 Rashes   _56 Ulcers   _57 Wounds Psychological:  _58 History of anxiety   _59  History of major depression.    Physical Exam BP 115/65 (BP Location: Left Arm, Patient Position:  Sitting)   Pulse (!) 103   Resp 12   Ht _60  (1.6 m)   Wt 44.9 kg (99 lb)   BMI 17.54 kg/m  Gen: Thin, debilitated appearing, NAD Head: [place]/AT, + temporalis wasting.  Ear/Nose/Throat: Hearing grossly intact, nares w/o erythema or drainage, oropharynx w/o Erythema/Exudate Eyes: Conjunctiva clear, sclera non-icteric  Neck: trachea midline.  No JVD.  Pulmonary:  Good air movement, respirations not labored, no use of accessory muscles Cardiac: Irregular Vascular:  Vessel Right Left  Radial Palpable Palpable  Gastrointestinal: soft, non-tender. Distention from ascites present Musculoskeletal: M/S 5/5 throughout.  Extremities without ischemic changes.  No deformity or atrophy. Mild LE edema. Neurologic: Sensation grossly intact in extremities.  Symmetrical.  Speech is fluent. Motor exam as listed above. Psychiatric: Judgment intact, Mood & affect appropriate for pt's clinical situation. Dermatologic: No rashes or ulcers noted.  No cellulitis or open wounds.  Radiology US Paracentesis  Result Date: 01/22/2018 CLINICAL DATA:  Cirrhosis, recurrent large volume ascites. EXAM: ULTRASOUND GUIDED PARACENTESIS TECHNIQUE: The procedure, risks (including but not limited to bleeding, infection, organ damage ), benefits, and alternatives were explained to the patient. Questions regarding the procedure were encouraged and answered. The patient understands and consents to the procedure. Survey ultrasound of the abdomen was performed and an appropriate skin entry site in the right lower abdomen was selected. Skin site was marked, prepped with chlorhexadine, draped in usual sterile fashion, and infiltrated locally with 1% lidocaine. A Safe-T-Centesis needle was advanced into the peritoneal space until fluid could be aspirated. The sheath was advanced and the needle removed. 4 L of clear yellow ascites were aspirated. A small amount of residual ascites was noted on post  aspiration imaging. The patient tolerated the procedure well. COMPLICATIONS: COMPLICATIONS none IMPRESSION: Technically successful ultrasound guided paracentesis, removing 4 L ascites. Electronically Signed   By: Lucrezia Europe M.D.   On: 01/22/2018 15:10   US Paracentesis  Result Date: 01/19/2018 INDICATION: 77 year old with recurrent ascites. Plan for therapeutic paracentesis. EXAM: ULTRASOUND GUIDED THERAPEUTIC PARACENTESIS MEDICATIONS: None. COMPLICATIONS: None immediate. PROCEDURE: Informed written consent was obtained from the patient after a discussion of the risks, benefits and alternatives to treatment. A timeout was performed prior to the initiation of the procedure. Initial ultrasound scanning demonstrates a large amount of ascites within the right lower abdominal quadrant. The right lower abdomen was prepped and draped in the usual sterile fashion. 1% lidocaine was used for local anesthesia. Following this, a 6 Fr Safe-T-Centesis catheter was introduced. An ultrasound image was saved for documentation purposes. The paracentesis was performed. The catheter was removed and a dressing was applied. The patient tolerated the procedure well without immediate post procedural complication. FINDINGS: A total of approximately 3.2 L of cloudy yellow fluid was removed. IMPRESSION: Successful ultrasound-guided paracentesis yielding 3.2 liters of peritoneal fluid. Electronically Signed   By: Markus Daft M.D.   On: 01/19/2018 11:59   US Paracentesis  Result Date: 01/16/2018 INDICATION: Cirrhosis, recurrent ascites. Request made for therapeutic paracentesis up to 4 liters. EXAM: ULTRASOUND GUIDED THERAPEUTIC PARACENTESIS MEDICATIONS: None COMPLICATIONS: None immediate. PROCEDURE: Informed written consent was obtained from the patient after a discussion of the risks, benefits and alternatives to treatment. A timeout was performed prior to the initiation of the procedure. Initial ultrasound scanning demonstrates a large  amount of ascites within the right mid to lower abdominal quadrant. The right mid to lower abdomen was prepped and draped in the usual sterile fashion. 1% lidocaine was used for local anesthesia. Following this, a 6 Fr Safe-T-Centesis catheter was introduced. An ultrasound image was saved for documentation purposes. The paracentesis was performed. The catheter was removed and a dressing was applied. The patient tolerated the procedure well without immediate post procedural complication. FINDINGS: A total of approximately 4 liters of hazy, yellow fluid was removed. IMPRESSION: Successful ultrasound-guided therapeutic paracentesis yielding 4 liters of peritoneal fluid. Patient will receive IV albumin infusion postprocedure. Read by: Rowe Robert, PA-C Electronically Signed   By: Inez Catalina M.D.   On: 01/16/2018 11:03  US Paracentesis  Result Date: 01/13/2018 INDICATION: Cirrhosis. Recurrent ascites. Request for therapeutic paracentesis up to 4 liters. EXAM: ULTRASOUND GUIDED RIGHT LATERAL ABDOMEN PARACENTESIS MEDICATIONS: 1% Lidocaine = 10 mL COMPLICATIONS: None immediate. PROCEDURE: Informed written consent was obtained from the patient after a discussion of the risks, benefits and alternatives to treatment. A timeout was performed prior to the initiation of the procedure. Initial ultrasound scanning demonstrates a large amount of ascites within the right lateral abdomen. The right lateral abdomen was prepped and draped in the usual sterile fashion. 1% lidocaine with epinephrine was used for local anesthesia. Following this, a 6 Fr Safe-T-Centesis catheter was introduced. An ultrasound image was saved for documentation purposes. The paracentesis was performed. The catheter was removed and a dressing was applied. The patient tolerated the procedure well without immediate post procedural complication. FINDINGS: A total of approximately 4 liters of clear yellow fluid was removed. IMPRESSION: Successful  ultrasound-guided paracentesis yielding 4 liters of peritoneal fluid. Read by: Gareth Eagle, PA-C Electronically Signed   By: Marybelle Killings M.D.   On: 01/13/2018 10:30   US Paracentesis  Result Date: 01/08/2018 INDICATION: 77 year old with recurrent ascites. EXAM: ULTRASOUND GUIDED PARACENTESIS MEDICATIONS: None. COMPLICATIONS: None immediate. PROCEDURE: Informed written consent was obtained from the patient after a discussion of the risks, benefits and alternatives to treatment. A timeout was performed prior to the initiation of the procedure. Initial ultrasound scanning demonstrates a large amount of ascites within the right lower abdominal quadrant. The right lower abdomen was prepped and draped in the usual sterile fashion. 1% lidocaine was used for local anesthesia. Following this, a 6 Fr Safe-T-Centesis catheter was introduced. An ultrasound image was saved for documentation purposes. The paracentesis was performed. The catheter was removed and a dressing was applied. The patient tolerated the procedure well without immediate post procedural complication. FINDINGS: A total of approximately 5 L of yellow fluid was removed. IMPRESSION: Successful ultrasound-guided paracentesis yielding 5 liters of peritoneal fluid. Electronically Signed   By: Markus Daft M.D.   On: 01/08/2018 10:52   US Paracentesis  Result Date: 01/02/2018 INDICATION: Cirrhosis. Recurrent ascites. Request for therapeutic paracentesis up to 4 liters. EXAM: ULTRASOUND GUIDED RIGHT LATERAL ABDOMEN PARACENTESIS MEDICATIONS: 1% Lidocaine = 10 mL COMPLICATIONS: None immediate. PROCEDURE: Informed written consent was obtained from the patient after a discussion of the risks, benefits and alternatives to treatment. A timeout was performed prior to the initiation of the procedure. Initial ultrasound scanning demonstrates a large amount of ascites within the right lateral abdomen. The right lateral abdomen was prepped and draped in the usual sterile  fashion. 1% lidocaine with epinephrine was used for local anesthesia. Following this, a 6 Fr Safe-T-Centesis catheter was introduced. An ultrasound image was saved for documentation purposes. The paracentesis was performed. The catheter was removed and a dressing was applied. The patient tolerated the procedure well without immediate post procedural complication. FINDINGS: A total of approximately 4 liters of clear yellow fluid was removed. IMPRESSION: Successful ultrasound-guided paracentesis yielding 4 liters of peritoneal fluid. Read by: Gareth Eagle, PA-C Electronically Signed   By: Sandi Mariscal M.D.   On: 01/02/2018 12:29   US Paracentesis  Result Date: 12/26/2017 INDICATION: 77 year old female with a history of ascites EXAM: ULTRASOUND GUIDED  PARACENTESIS MEDICATIONS: None. COMPLICATIONS: None PROCEDURE: Informed written consent was obtained from the patient after a discussion of the risks, benefits and alternatives to treatment. A timeout was performed prior to the initiation of the procedure. Initial ultrasound scanning demonstrates a large amount  of ascites within the right lower abdominal quadrant. The right lower abdomen was prepped and draped in the usual sterile fashion. 1% lidocaine with epinephrine was used for local anesthesia. Following this, a 8 Fr Safe-T-Centesis catheter was introduced. An ultrasound image was saved for documentation purposes. The paracentesis was performed. The catheter was removed and a dressing was applied. The patient tolerated the procedure well without immediate post procedural complication. FINDINGS: A total of approximately 4.0 L of thin yellow fluid was removed. None IMPRESSION: Status post ultrasound-guided paracentesis. Signed, Dulcy Fanny. Earleen Newport, DO [facility] Radiology Electronically Signed   By: Corrie Mckusick D.O.   On: 12/26/2017 13:28    Labs Recent Results (from the past 2160 hour(s))  Urinalysis, Complete w  Microscopic     Status: Abnormal   Collection Time: 11/17/17  4:56 PM  Result Value Ref Range   Color, Urine YELLOW (A) YELLOW   APPearance TURBID (A) CLEAR   Specific Gravity, Urine 1.012 1.005 - 1.030   pH 5.0 5.0 - 8.0   Glucose, UA NEGATIVE NEGATIVE mg/dL   Hgb urine dipstick LARGE (A) NEGATIVE   Bilirubin Urine NEGATIVE NEGATIVE   Ketones, ur NEGATIVE NEGATIVE mg/dL   Protein, ur 30 (A) NEGATIVE mg/dL   Nitrite NEGATIVE NEGATIVE   Leukocytes, UA LARGE (A) NEGATIVE   RBC / HPF TOO NUMEROUS TO COUNT 0 - 5 RBC/hpf   WBC, UA TOO NUMEROUS TO COUNT 0 - 5 WBC/hpf   Bacteria, UA NONE SEEN NONE SEEN   Squamous Epithelial / LPF NONE SEEN NONE SEEN   WBC Clumps PRESENT    Budding Yeast PRESENT     Comment: Performed at [facility], [address] [place], [place] [address]  Comprehensive metabolic panel     Status: Abnormal   Collection Time: 11/17/17  5:10 PM  Result Value Ref Range   Sodium 136 135 - 145 mmol/L   Potassium 4.9 3.5 - 5.1 mmol/L   Chloride 103 101 - 111 mmol/L   CO2 24 22 - 32 mmol/L   Glucose, Bld 138 (H) 65 - 99 mg/dL   BUN 85 (H) 6 - 20 mg/dL   Creatinine, Ser 4.41 (H) 0.44 - 1.00 mg/dL   Calcium 8.3 (L) 8.9 - 10.3 mg/dL   Total Protein 7.3 6.5 - 8.1 g/dL   Albumin 2.1 (L) 3.5 - 5.0 g/dL   AST 43 (H) 15 - 41 U/L   ALT 20 14 - 54 U/L   Alkaline Phosphatase 174 (H) 38 - 126 U/L   Total Bilirubin 1.7 (H) 0.3 - 1.2 mg/dL   GFR calc non Af Amer 9 (L) >60 mL/min   GFR calc Af Amer 10 (L) >60 mL/min    Comment: (NOTE) The eGFR has been calculated using the CKD EPI equation. This calculation has not been validated in all clinical situations. eGFR's persistently <60 mL/min signify possible Chronic Kidney Disease.    Anion gap 9 5 - 15    Comment: Performed at [facility], [address] [place],  [address]  CBC with Differential/Platelet     Status: Abnormal   Collection Time: 11/17/17  5:10 PM  Result Value Ref Range   WBC 9.9  3.6 - 11.0 K/uL   RBC 3.79 (L) 3.80 - 5.20 MIL/uL   Hemoglobin 13.0 12.0 - 16.0 g/dL   HCT 38.1 35.0 - 47.0 %   MCV 100.7 (H) 80.0 - 100.0 fL   MCH 34.3 (H) 26.0 -  34.0 pg   MCHC 34.1 32.0 - 36.0 g/dL   RDW 16.4 (H) 11.5 - 14.5 %   Platelets 141 (L) 150 - 440 K/uL   Neutrophils Relative % 73 %   Neutro Abs 7.3 (H) 1.4 - 6.5 K/uL   Lymphocytes Relative 12 %   Lymphs Abs 1.2 1.0 - 3.6 K/uL   Monocytes Relative 10 %   Monocytes Absolute 1.0 (H) 0.2 - 0.9 K/uL   Eosinophils Relative 4 %   Eosinophils Absolute 0.4 0 - 0.7 K/uL   Basophils Relative 1 %   Basophils Absolute 0.1 0 - 0.1 K/uL    Comment: Performed at [facility], [address], [place], [place] [address]  Basic metabolic panel     Status: Abnormal   Collection Time: 11/18/17  6:14 AM  Result Value Ref Range   Sodium 135 135 - 145 mmol/L   Potassium 4.5 3.5 - 5.1 mmol/L   Chloride 107 101 - 111 mmol/L   CO2 22 22 - 32 mmol/L   Glucose, Bld 102 (H) 65 - 99 mg/dL   BUN 85 (H) 6 - 20 mg/dL   Creatinine, Ser 4.31 (H) 0.44 - 1.00 mg/dL   Calcium 8.0 (L) 8.9 - 10.3 mg/dL   GFR calc non Af Amer 9 (L) >60 mL/min   GFR calc Af Amer 11 (L) >60 mL/min    Comment: (NOTE) The eGFR has been calculated using the CKD EPI equation. This calculation has not been validated in all clinical situations. eGFR's persistently <60 mL/min signify possible Chronic Kidney Disease.    Anion gap 6 5 - 15    Comment: Performed at [facility], [address] [place], [place] [address]  CBC     Status: Abnormal   Collection Time: 11/18/17  6:14 AM  Result Value Ref Range   WBC 8.7 3.6 - 11.0 K/uL   RBC 3.52 (L) 3.80 - 5.20 MIL/uL   Hemoglobin 11.9 (L) 12.0 - 16.0 g/dL   HCT 35.4 35.0 - 47.0 %   MCV 100.8 (H) 80.0 - 100.0 fL   MCH 33.9 26.0 - 34.0 pg   MCHC 33.6 32.0 - 36.0 g/dL   RDW 16.1 (H) 11.5 - 14.5 %   Platelets 130 (L) 150 - 440 K/uL    Comment: Performed at [facility], [address]  [place], [place] [address]  Body fluid cell count with differential     Status: Abnormal   Collection Time: 11/18/17 12:00 PM  Result Value Ref Range   Fluid Type-FCT PERITONEAL     Comment: CORRECTED ON 01/29 AT 1239: PREVIOUSLY REPORTED AS CYTOPERI   Color, Fluid YELLOW (A) YELLOW   Appearance, Fluid HAZY (A) CLEAR   WBC, Fluid 233 cu mm   Neutrophil Count, Fluid 29 %   Lymphs, Fluid 59 %   Monocyte-Macrophage-Serous Fluid 12 %   Eos, Fluid 0 %    Comment: Performed at [facility], [address] [place], [place] [address]  Body fluid culture     Status: None   Collection Time: 11/18/17 12:00 PM  Result Value Ref Range   Specimen Description      PERITONEAL Performed at [facility], [address] [place], [place] [address]    Special Requests      NONE Performed at [facility], [address] [place], [place] [address]    Gram Stain NO WBC SEEN NO ORGANISMS SEEN     Culture  No growth aerobically or anaerobically. Performed at [facility], [place] [address], [place], [place] [address]    Report Status 11/22/2017 FINAL   Albumin, pleural or peritoneal fluid     Status: None   Collection Time: 11/18/17 12:00 PM  Result Value Ref Range   Albumin, Fluid <1.0 g/dL    Comment: (NOTE) No normal range established for this test Results should be evaluated in conjunction with serum values    Fluid Type-FALB PERITONEAL     Comment: Performed at  [facility], [address] [place], [place] [address] CORRECTED ON 01/29 AT 1239: PREVIOUSLY REPORTED AS CYTOPERI   Glucose, pleural or peritoneal fluid     Status: None   Collection Time: 11/18/17 12:00 PM  Result Value Ref Range   Glucose, Fluid 132 mg/dL    Comment: (NOTE) No normal range established for this test Results should be evaluated in conjunction with serum values    Fluid Type-FGLU PERITONEAL     Comment: Performed at [facility], [address]  [place], [place] [address] CORRECTED ON 01/29 AT 1239: PREVIOUSLY REPORTED AS CYTOPERI   Pathologist smear review     Status: None   Collection Time: 11/18/17 12:00 PM  Result Value Ref Range   Path Review Cytospin of peritoneal fluid is reviewed.     Comment: Negative for malignancy. Absolute neutrophil count 68 cells per cu mm. Reviewed by Dellia Nims Reuel Derby, M.D. Performed at [facility], [address] [place], [place] [address]   CBC     Status: Abnormal   Collection Time: 11/19/17  7:30 AM  Result Value Ref Range   WBC 6.9 3.6 - 11.0 K/uL   RBC 3.17 (L) 3.80 - 5.20 MIL/uL   Hemoglobin 10.9 (L) 12.0 - 16.0 g/dL   HCT 31.9 (L) 35.0 - 47.0 %   MCV 100.6 (H) 80.0 - 100.0 fL   MCH 34.4 (H) 26.0 - 34.0 pg   MCHC 34.2 32.0 - 36.0 g/dL   RDW 16.3 (H) 11.5 - 14.5 %   Platelets 79 (L) 150 - 440 K/uL    Comment: Performed at [facility], [address] [place], [place] [address]  Comprehensive metabolic panel     Status: Abnormal   Collection Time: 11/19/17  7:30 AM  Result Value Ref Range   Sodium 135 135 - 145 mmol/L   Potassium 4.4 3.5 - 5.1 mmol/L   Chloride 106 101 - 111 mmol/L   CO2 21 (L) 22 - 32 mmol/L   Glucose, Bld 106 (H) 65 - 99 mg/dL   BUN 85 (H) 6 - 20 mg/dL   Creatinine, Ser 4.07 (H) 0.44 - 1.00 mg/dL   Calcium 8.3 (L) 8.9 - 10.3 mg/dL   Total Protein 6.2 (L) 6.5 - 8.1 g/dL   Albumin 2.9 (L) 3.5 - 5.0 g/dL   AST 30 15 - 41 U/L   ALT 14 14 - 54 U/L   Alkaline Phosphatase 135 (H) 38 - 126 U/L   Total Bilirubin 2.0 (H) 0.3 - 1.2 mg/dL   GFR calc non Af Amer 10 (L) >60 mL/min   GFR calc Af Amer 11 (L) >60 mL/min    Comment: (NOTE) The eGFR has been calculated using the CKD EPI equation. This calculation has not been validated in all clinical situations. eGFR's persistently <60 mL/min signify possible Chronic Kidney Disease.    Anion gap 8 5 - 15    Comment: Performed at [facility],  [place], [place] [address]    Comprehensive metabolic panel     Status: Abnormal   Collection Time: 11/20/17  4:12 AM  Result Value Ref Range   Sodium 140 135 - 145 mmol/L   Potassium 4.7 3.5 - 5.1 mmol/L   Chloride 108 101 - 111 mmol/L   CO2 20 (L) 22 - 32 mmol/L   Glucose, Bld 110 (H) 65 - 99 mg/dL   BUN 87 (H) 6 - 20 mg/dL   Creatinine, Ser 4.30 (H) 0.44 - 1.00 mg/dL   Calcium 8.4 (L) 8.9 - 10.3 mg/dL   Total Protein 6.1 (L) 6.5 - 8.1 g/dL   Albumin 3.2 (L) 3.5 - 5.0 g/dL   AST 27 15 - 41 U/L   ALT 12 (L) 14 - 54 U/L   Alkaline Phosphatase 125 38 - 126 U/L   Total Bilirubin 1.9 (H) 0.3 - 1.2 mg/dL   GFR calc non Af Amer 9 (L) >60 mL/min   GFR calc Af Amer 11 (L) >60 mL/min    Comment: (NOTE) The eGFR has been calculated using the CKD EPI equation. This calculation has not been validated in all clinical situations. eGFR's persistently <60 mL/min signify possible Chronic Kidney Disease.    Anion gap 12 5 - 15    Comment: Performed at [facility], [address] [place], [place] [address]  Comprehensive metabolic panel     Status: Abnormal   Collection Time: 11/21/17  6:03 AM  Result Value Ref Range   Sodium 139 135 - 145 mmol/L   Potassium 4.6 3.5 - 5.1 mmol/L   Chloride 106 101 - 111 mmol/L   CO2 22 22 - 32 mmol/L   Glucose, Bld 106 (H) 65 - 99 mg/dL   BUN 86 (H) 6 - 20 mg/dL   Creatinine, Ser 4.28 (H) 0.44 - 1.00 mg/dL   Calcium 8.9 8.9 - 10.3 mg/dL   Total Protein 6.8 6.5 - 8.1 g/dL   Albumin 4.4 3.5 - 5.0 g/dL   AST 24 15 - 41 U/L   ALT 11 (L) 14 - 54 U/L   Alkaline Phosphatase 84 38 - 126 U/L   Total Bilirubin 2.4 (H) 0.3 - 1.2 mg/dL   GFR calc non Af Amer 9 (L) >60 mL/min   GFR calc Af Amer 11 (L) >60 mL/min    Comment: (NOTE) The eGFR has been calculated using the CKD EPI equation. This calculation has not been validated in all clinical situations. eGFR's persistently <60 mL/min signify possible Chronic Kidney Disease.    Anion gap 11 5 - 15    Comment: Performed at  [facility], [address] [place], [place] [address]  CBC     Status: Abnormal   Collection Time: 11/21/17  6:03 AM  Result Value Ref Range   WBC 8.7 3.6 - 11.0 K/uL   RBC 3.10 (L) 3.80 - 5.20 MIL/uL   Hemoglobin 10.5 (L) 12.0 - 16.0 g/dL   HCT 31.4 (L) 35.0 - 47.0 %   MCV 101.2 (H) 80.0 - 100.0 fL   MCH 34.0 26.0 - 34.0 pg   MCHC 33.6 32.0 - 36.0 g/dL   RDW 16.5 (H) 11.5 - 14.5 %   Platelets 85 (L) 150 - 440 K/uL    Comment: Performed at [facility], [address] [place], [place] [address]  Comprehensive metabolic panel     Status: Abnormal   Collection Time: 11/22/17  5:16 AM  Result Value  Ref Range   Sodium 139 135 - 145 mmol/L   Potassium 4.7 3.5 - 5.1 mmol/L   Chloride 106 101 - 111 mmol/L   CO2 22 22 - 32 mmol/L   Glucose, Bld 93 65 - 99 mg/dL   BUN 89 (H) 6 - 20 mg/dL   Creatinine, Ser 4.59 (H) 0.44 - 1.00 mg/dL   Calcium 8.6 (L) 8.9 - 10.3 mg/dL   Total Protein 6.1 (L) 6.5 - 8.1 g/dL   Albumin 3.5 3.5 - 5.0 g/dL   AST 27 15 - 41 U/L   ALT 12 (L) 14 - 54 U/L   Alkaline Phosphatase 128 (H) 38 - 126 U/L   Total Bilirubin 2.2 (H) 0.3 - 1.2 mg/dL   GFR calc non Af Amer 9 (L) >60 mL/min   GFR calc Af Amer 10 (L) >60 mL/min    Comment: (NOTE) The eGFR has been calculated using the CKD EPI equation. This calculation has not been validated in all clinical situations. eGFR's persistently <60 mL/min signify possible Chronic Kidney Disease.    Anion gap 11 5 - 15    Comment: Performed at [facility], [address] [place], [place] [address]  Comprehensive metabolic panel     Status: Abnormal   Collection Time: 11/23/17  5:15 AM  Result Value Ref Range   Sodium 137 135 - 145 mmol/L   Potassium 4.7 3.5 - 5.1 mmol/L   Chloride 105 101 - 111 mmol/L   CO2 20 (L) 22 - 32 mmol/L   Glucose, Bld 95 65 - 99 mg/dL   BUN 95 (H) 6 - 20 mg/dL   Creatinine, Ser 4.59 (H) 0.44 - 1.00 mg/dL   Calcium 8.7 (L) 8.9 - 10.3 mg/dL   Total Protein 6.4  (L) 6.5 - 8.1 g/dL   Albumin 3.3 (L) 3.5 - 5.0 g/dL   AST 31 15 - 41 U/L   ALT 14 14 - 54 U/L   Alkaline Phosphatase 141 (H) 38 - 126 U/L   Total Bilirubin 2.8 (H) 0.3 - 1.2 mg/dL   GFR calc non Af Amer 9 (L) >60 mL/min   GFR calc Af Amer 10 (L) >60 mL/min    Comment: (NOTE) The eGFR has been calculated using the CKD EPI equation. This calculation has not been validated in all clinical situations. eGFR's persistently <60 mL/min signify possible Chronic Kidney Disease.    Anion gap 12 5 - 15    Comment: Performed at [facility], [address] [place], [place] [address]  CBC     Status: Abnormal   Collection Time: 11/24/17  3:15 AM  Result Value Ref Range   WBC 8.7 3.6 - 11.0 K/uL   RBC 3.29 (L) 3.80 - 5.20 MIL/uL   Hemoglobin 11.3 (L) 12.0 - 16.0 g/dL   HCT 33.5 (L) 35.0 - 47.0 %   MCV 101.8 (H) 80.0 - 100.0 fL   MCH 34.5 (H) 26.0 - 34.0 pg   MCHC 33.9 32.0 - 36.0 g/dL   RDW 16.6 (H) 11.5 - 14.5 %   Platelets 77 (L) 150 - 440 K/uL    Comment: Performed at [facility], [address] [place], [place] [address]  NuSwab BV and Candida, NAA     Status: None   Collection Time: 12/04/17  2:52 PM  Result Value Ref Range   Atopobium vaginae Low - 0 Score   BVAB 2 Low - 0 Score   Megasphaera 1 Low - 0 Score  Comment: Calculate total score by adding the 3 individual bacterial vaginosis (BV) marker scores together.  Total score is interpreted as follows: Total score 0-1: Indicates the absence of BV. Total score   2: Indeterminate for BV. Additional clinical                  data should be evaluated to establish a                  diagnosis. Total score 3-6: Indicates the presence of BV. This test was developed and its performance characteristics determined by [facility].  It has not been cleared or approved by the Food and Drug Administration.  The FDA has determined that such clearance or approval is not necessary.    Candida albicans, NAA Negative  Negative   Candida glabrata, NAA Negative Negative    Comment: This test was developed and its performance characteristics determined by [facility].  It has not been cleared or approved by the Food and Drug Administration.  The FDA has determined that such clearance or approval is not necessary.     Assessment/Plan:  Cirrhosis of liver ([place]) Creating marked ascites requiring multiple paracentesis weekly.  Peritoneal dialysis catheter would potentially address this issue as well.  Essential hypertension, benign Likely an underlying cause of renal failure and blood pressure control important in reducing the progression of atherosclerotic disease. On appropriate oral medications.   CKD (chronic kidney disease) stage 5, GFR less than 15 ml/min (HCC) The patient now has a GFR of 11 and is in need of some sort of dialysis.  She has refused hemodialysis.  Peritoneal dialysis would potentially serve 2 purposes with the draining of her ascites as well as her peritoneal dialysis needs for chronic kidney disease.  That sounds like a reasonable option although infection is at a higher risk with ascites.  At this point, the patient is agreeable to try this.  Risks and benefits of peritoneal dialysis catheter placement were discussed with the patient and she is agreeable to proceed.      Leotis Pain 01/23/2018, 4:45 PM   This note was created with Dragon medical transcription system.  Any errors from dictation are unintentional.

## 2018-01-23 NOTE — Assessment & Plan Note (Signed)
The patient now has a GFR of 11 and is in need of some sort of dialysis.  She has refused hemodialysis.  Peritoneal dialysis would potentially serve 2 purposes with the draining of her ascites as well as her peritoneal dialysis needs for chronic kidney disease.  That sounds like a reasonable option although infection is at a higher risk with ascites.  At this point, the patient is agreeable to try this.  Risks and benefits of peritoneal dialysis catheter placement were discussed with the patient and she is agreeable to proceed.

## 2018-01-23 NOTE — Assessment & Plan Note (Signed)
Likely an underlying cause of renal failure and blood pressure control important in reducing the progression of atherosclerotic disease. On appropriate oral medications.  

## 2018-01-23 NOTE — Telephone Encounter (Signed)
Copied from CRM [phone]. Topic: Quick Communication - See Telephone Encounter >> Jan 23, 2018  4:20 PM Floria RavelingStovall, Shana A wrote: CRM for notification. See Telephone encounter for: 01/23/18. Mitzi with Hospice [phone]  Pt has been confused. Pt was positive UTI , e. Coli , she is sensitive to every antibiotic but suffer drugs   Need order to Antibiotics

## 2018-01-23 NOTE — Patient Instructions (Signed)
Peritoneal Dialysis Catheter Placement Peritoneal dialysis catheter placement is a surgery to insert a thin, flexible tube (catheter) in your abdomen. This surgery must be done before you begin peritoneal dialysis. Dialysis is a treatment that replaces some of the work that healthy kidneys do. During dialysis, wastes, salt, and extra water are removed from the blood. In peritoneal dialysis, these tasks are performed by transferring a fluid called dialysate to and from your abdomen during each session. The fluid goes through the catheter to enter the abdomen at the start of each dialysis session, and it drains out of the body through the catheter at the end of each session. The catheter will be small, soft, and easy to conceal. The surgery is usually done at least 2 weeks before you begin dialysis. Tell a health care provider about:  Any allergies you have.  All medicines you are taking, including vitamins, herbs, eye drops, creams, and over-the-counter medicines.  Use of steroids (by mouth or as creams).  Any problems you or family members have had with anesthetic medicines.  Any blood disorders you have.  Any surgeries you have had.  Smoking history.  Possibility of pregnancy, if this applies.  Any medical conditions you have. What are the risks? Generally, peritoneal dialysis catheter placement is a safe procedure. However, as with any procedure, complications can occur. Possible complications include:  Excessive bleeding.  Pain.  A collection of blood near the incision (hematoma).  Infection.  Slow healing.  Catheter problems after the surgery, such as the catheter becoming blocked, the catheter moving out of place, the catheter poking into or wrapping around intestines, or fluid leaking around the catheter.  Scarring.  Skin damage.  Damage to blood vessels, tissues, or organs (such as the intestines) in the abdomen area.  What happens before the procedure?  Ask your  health care provider about changing or stopping your regular medicines. You may need to stop taking certain medicines up to 2 weeks before the procedure.  Do not eat or drink anything for at least 8 hours before the procedure.  You may be asked to wash with an antibacterial soap the night before or the morning of the procedure.  Make plans to have someone drive you home after the procedure or after your hospital stay. Ask your health care provider whether you should expect to stay in the hospital overnight or go home the same day. What happens during the procedure? The surgeon may use either an open or laparoscopic technique for this surgery.  Open surgery. ? You will be given a medicine to make you sleep through the procedure (general anesthetic). ? Once you are asleep, the surgeon will make a small cut (incision) in your abdomen. ? The catheter will be put in place. ? The incision will be closed with stitches or staples.  Laparoscopic surgery. ? You will be given a medicine to numb the site selected for the incision (local anesthetic). ? When the area is numb, the surgeon will make a small incision in your abdomen. ? A thin, lighted tube with a tiny camera on the end (laparoscope) will be put through the incision. The camera sends pictures to a video screen in the operating room. This lets the surgeon see inside the abdomen during the procedure. ? The catheter will be put in place. ? The incision will be closed with stitches or staples.  What happens after the procedure?  You will be monitored closely in a recovery area. You may feel groggy   from the anesthetic.  You may have some pain. If so, you will be given pain medicine.  You may be able to go home the same day as the procedure, or you may need to stay in the hospital overnight.  You will be given instructions on how to care for the catheter and how it is used for the dialysis process.  Your body will heal around the catheter  to hold it in place. This information is not intended to replace advice given to you by your health care provider. Make sure you discuss any questions you have with your health care provider. Document Released: 09/25/2009 Document Revised: 03/14/2016 Document Reviewed: 03/29/2013 Elsevier Interactive Patient Education  2017 Elsevier Inc.  

## 2018-01-23 NOTE — Telephone Encounter (Signed)
Please advise 

## 2018-01-24 ENCOUNTER — Other Ambulatory Visit: Payer: Self-pay | Admitting: Internal Medicine

## 2018-01-26 ENCOUNTER — Ambulatory Visit
Admission: RE | Admit: 2018-01-26 | Discharge: 2018-01-26 | Disposition: A | Discharge status: Home / Self Care | Source: Ambulatory Visit | Attending: Nephrology | Admitting: Nephrology

## 2018-01-26 ENCOUNTER — Telehealth: Payer: Self-pay | Admitting: Family

## 2018-01-26 ENCOUNTER — Other Ambulatory Visit: Payer: Self-pay | Admitting: Nephrology

## 2018-01-26 ENCOUNTER — Encounter (INDEPENDENT_AMBULATORY_CARE_PROVIDER_SITE_OTHER): Payer: Self-pay

## 2018-01-26 DIAGNOSIS — R188 Other ascites: Secondary | ICD-10-CM | POA: Diagnosis not present

## 2018-01-26 DIAGNOSIS — K7460 Unspecified cirrhosis of liver: Secondary | ICD-10-CM

## 2018-01-26 MED ORDER — ONDANSETRON HCL 4 MG PO TABS: 4.0000 mg | ORAL_TABLET | Freq: Three times a day (TID) | ORAL | 0 refills | Status: DC | PRN

## 2018-01-26 NOTE — Telephone Encounter (Signed)
Copied from CRM [phone]. Topic: Quick Communication - Rx Refill/Question >> Jan 26, 2018 10:30 AM Alexander BergeronBarksdale, Harvey B wrote: Medication: rifaximin (XIFAXAN) 550 MG TABS tablet [[id number]][[id number]]   [facility] called b/c family is requesting for the pt to go back to taking 2 pills daily b/c pt fell over the weekend; hospice also mentioned pt is taking the antibiotic for UTI, pt's family states pt's confusion is not getting better and pt is wandering around @ night as well also refuses to use her walker,   Contact hospice @ [phone] [facility]

## 2018-01-26 NOTE — Discharge Instructions (Signed)
Paracentesis, Care After °Refer to this sheet in the next few weeks. These instructions provide you with information about caring for yourself after your procedure. Your health care provider may also give you more specific instructions. Your treatment has been planned according to current medical practices, but problems sometimes occur. Call your health care provider if you have any problems or questions after your procedure. °What can I expect after the procedure? °After your procedure, it is common to have a small amount of clear fluid coming from the puncture site. °Follow these instructions at home: °· Return to your normal activities as told by your health care provider. Ask your health care provider what activities are safe for you. °· Take over-the-counter and prescription medicines only as told by your health care provider. °· Do not take baths, swim, or use a hot tub until your health care provider approves. °· Follow instructions from your health care provider about: °? How to take care of your puncture site. °? When and how you should change your bandage (dressing). °? When you should remove your dressing. °· Check your puncture area every day signs of infection. Watch for: °? Redness, swelling, or pain. °? Fluid, blood, or pus. °· Keep all follow-up visits as told by your health care provider. This is important. °Contact a health care provider if: °· You have redness, swelling, or pain at your puncture site. °· You start to have more clear fluid coming from your puncture site. °· You have blood or pus coming from your puncture site. °· You have chills. °· You have a fever. °Get help right away if: °· You develop chest pain or shortness of breath. °· You develop increasing pain, discomfort, or swelling in your abdomen. °· You feel dizzy or light-headed or you pass out. °This information is not intended to replace advice given to you by your health care provider. Make sure you discuss any questions you  have with your health care provider. °Document Released: 02/21/2015 Document Revised: 03/14/2016 Document Reviewed: 12/20/2014 °Elsevier Interactive Patient Education © 2018 Elsevier Inc. ° °

## 2018-01-26 NOTE — Telephone Encounter (Signed)
Please advise 

## 2018-01-26 NOTE — Telephone Encounter (Signed)
[facility] [phone] Reason for call: wandering ,  Symptoms: got up 3:00 am to take shower, walked  out of bathroom left heater  running and shower running, two falls, went into laundry room and fell into laundry basket no injury, went into laundry room and was over dog food bowls and couldn't get up 5:00am and went to  front porch to make bacon , family is worn out, Patient will not use walker She reorientes well.    Mitzi wants to know if can increase  Xifaxan 550 mg bid , currently on 1 day.  Decreased to once due to nausea, want to know if can  premedicated with halidol due to nausea.  ?   Not sure if symptoms are related to UTI.  Only 1 liter of fluid drained of this morning .   No paracentsis  .   Dr Cherylann RatelLateef told patient drain put drain that it would be one that she could  have dialysis.   Mitzi states that they could do supply with Zofran or Haldol.   Duration weekend

## 2018-01-26 NOTE — Telephone Encounter (Signed)
Looks like a historical medication in the pt's medication list.

## 2018-01-26 NOTE — Telephone Encounter (Signed)
Copied from CRM [phone]. Topic: Quick Communication - See Telephone Encounter >> Jan 26, 2018  3:53 PM Lorrine KinMcGee, Marcos Ruelas B, NT wrote: CRM for notification. See Telephone encounter for: 01/26/18.  Sam, pharmacist from [facility] [place] calling to inform Arnett that the clorazepate (TRANXENE-T) 3.75 MG tablet [[id number]][[id number]] is on back order. Upon looking in her medication list, I could not find where this medication was prescribed. He stated that the prescription was prescribed on 02/05/18. Please advise.

## 2018-01-26 NOTE — Telephone Encounter (Signed)
Spoke with Susan Greer  Advised to take haldol 0.19m if needed- this in comfort kit- for confusion, agitation  Called in zofran for nausea in hope patient can tolerate BID dosing of xifaxan.   Susan Greer will speak with patient and family regarding in patient hospice.   Explained she has been treated for UTI and Susan Greer doesn't feel we need to recheck urine at this time. No new patient symptoms. Will continue to monitor

## 2018-01-27 MED ORDER — LORAZEPAM 0.5 MG PO TABS: 0.5000 mg | ORAL_TABLET | Freq: Two times a day (BID) | ORAL | 0 refills | Status: DC | PRN

## 2018-01-27 NOTE — Telephone Encounter (Signed)
I called [facility] to find the source of who prescribed this medication ( Tranxene), because I did not see it in the patients medication list either nor historical med list. The pharmacist stated that this medication was called in on 12-29-17 my Misty the hospice nurse ( per Dr. Darrick Huntsmanullo). I see no record of that but I could have over looked it. The medication is on back order and they are wanting to know if can be substituted for something else.

## 2018-01-27 NOTE — Telephone Encounter (Signed)
[facility]

## 2018-01-27 NOTE — Telephone Encounter (Signed)
Baxter HireKristen,   I will write the rx to be signed based on margaret's request

## 2018-01-27 NOTE — Telephone Encounter (Signed)
Script faxed to [facility].  [facility] RN advised of below.

## 2018-01-27 NOTE — Telephone Encounter (Signed)
See attached regarding Tranxene

## 2018-01-27 NOTE — Telephone Encounter (Signed)
Baxter HireKristen, can you print an Rx for Ativan 0.5mg  BID prn for anxiety , 30 tablets, 1 refill.   We can start with that dose, frequency.  I would prefer something with less metabolites in context of her end stage cirrhosis, renal failure.   Would you print ? I can sign tomorrow or you could see if Dr Darrick Huntsmanullo would sign on behalf?  Thanks both of you for your help with her!

## 2018-01-29 ENCOUNTER — Ambulatory Visit
Admission: RE | Admit: 2018-01-29 | Discharge: 2018-01-29 | Disposition: A | Discharge status: Home / Self Care | Source: Ambulatory Visit | Attending: Nephrology | Admitting: Nephrology

## 2018-01-29 DIAGNOSIS — R188 Other ascites: Secondary | ICD-10-CM | POA: Diagnosis present

## 2018-01-29 MED ORDER — ALBUMIN HUMAN 25 % IV SOLN
INTRAVENOUS | Status: AC
  Administered 2018-01-29: 25 g via INTRAVENOUS
  Filled 2018-01-29: qty 100

## 2018-01-29 MED ORDER — ALBUMIN HUMAN 25 % IV SOLN
25.0000 g | Freq: Once | INTRAVENOUS | Status: AC
  Administered 2018-01-29: 25 g via INTRAVENOUS

## 2018-02-02 ENCOUNTER — Other Ambulatory Visit (INDEPENDENT_AMBULATORY_CARE_PROVIDER_SITE_OTHER): Payer: Self-pay | Admitting: Vascular Surgery

## 2018-02-02 ENCOUNTER — Ambulatory Visit
Admission: RE | Admit: 2018-02-02 | Discharge: 2018-02-02 | Disposition: A | Discharge status: Home / Self Care | Source: Ambulatory Visit | Attending: Nephrology | Admitting: Nephrology

## 2018-02-02 DIAGNOSIS — R188 Other ascites: Secondary | ICD-10-CM | POA: Insufficient documentation

## 2018-02-02 MED ORDER — ALBUMIN HUMAN 25 % IV SOLN
25.0000 g | Freq: Once | INTRAVENOUS | Status: AC
  Administered 2018-02-02: 25 g via INTRAVENOUS

## 2018-02-02 MED ORDER — ALBUMIN HUMAN 25 % IV SOLN
INTRAVENOUS | Status: AC
Start: 2018-02-02 — End: 2018-02-02
  Filled 2018-02-02: qty 100

## 2018-02-02 NOTE — Procedures (Signed)
Interventional Radiology Procedure Note  Procedure: US guided para.  Complications: None Recommendations:  - Ok to shower tomorrow - Do not submerge for 7 days - Routine care   Signed,  Enedelia Martorelli S. Krystian Ferrentino, DO    

## 2018-02-04 ENCOUNTER — Encounter
Admission: RE | Admit: 2018-02-04 | Discharge: 2018-02-04 | Disposition: A | Discharge status: Home / Self Care | Source: Ambulatory Visit | Attending: Vascular Surgery | Admitting: Vascular Surgery

## 2018-02-04 ENCOUNTER — Inpatient Hospital Stay: Admission: RE | Admit: 2018-02-04 | Source: Ambulatory Visit

## 2018-02-04 ENCOUNTER — Other Ambulatory Visit: Payer: Self-pay | Admitting: Obstetrics and Gynecology

## 2018-02-04 ENCOUNTER — Telehealth: Payer: Self-pay

## 2018-02-04 ENCOUNTER — Other Ambulatory Visit: Payer: Self-pay

## 2018-02-04 DIAGNOSIS — Z01810 Encounter for preprocedural cardiovascular examination: Secondary | ICD-10-CM | POA: Insufficient documentation

## 2018-02-04 DIAGNOSIS — R000 Tachycardia, unspecified: Secondary | ICD-10-CM | POA: Insufficient documentation

## 2018-02-04 DIAGNOSIS — B3731 Acute candidiasis of vulva and vagina: Secondary | ICD-10-CM

## 2018-02-04 DIAGNOSIS — Z01812 Encounter for preprocedural laboratory examination: Secondary | ICD-10-CM | POA: Insufficient documentation

## 2018-02-04 DIAGNOSIS — B373 Candidiasis of vulva and vagina: Secondary | ICD-10-CM

## 2018-02-04 DIAGNOSIS — N186 End stage renal disease: Secondary | ICD-10-CM | POA: Diagnosis not present

## 2018-02-04 HISTORY — DX: Personal history of urinary calculi: Z87.442

## 2018-02-04 HISTORY — DX: Cellulitis of left lower limb: L03.116

## 2018-02-04 HISTORY — DX: Chronic kidney disease, stage 5: N18.5

## 2018-02-04 HISTORY — DX: Unspecified cirrhosis of liver: K74.60

## 2018-02-04 LAB — CBC WITH DIFFERENTIAL/PLATELET
BASOS PCT: 1 %
Basophils Absolute: 0.1 10*3/uL (ref 0–0.1)
EOS ABS: 0.4 10*3/uL (ref 0–0.7)
EOS PCT: 4 %
HCT: 36.7 % (ref 35.0–47.0)
HEMOGLOBIN: 12.4 g/dL (ref 12.0–16.0)
Lymphocytes Relative: 9 %
Lymphs Abs: 0.9 10*3/uL — ABNORMAL LOW (ref 1.0–3.6)
MCH: 33.1 pg (ref 26.0–34.0)
MCHC: 33.8 g/dL (ref 32.0–36.0)
MCV: 97.9 fL (ref 80.0–100.0)
Monocytes Absolute: 0.7 10*3/uL (ref 0.2–0.9)
Monocytes Relative: 7 %
NEUTROS PCT: 79 %
Neutro Abs: 7.7 10*3/uL — ABNORMAL HIGH (ref 1.4–6.5)
PLATELETS: 166 10*3/uL (ref 150–440)
RBC: 3.75 MIL/uL — AB (ref 3.80–5.20)
RDW: 14.5 % (ref 11.5–14.5)
WBC: 9.8 10*3/uL (ref 3.6–11.0)

## 2018-02-04 LAB — BASIC METABOLIC PANEL
Anion gap: 7 (ref 5–15)
BUN: 56 mg/dL — ABNORMAL HIGH (ref 6–20)
CALCIUM: 8.5 mg/dL — AB (ref 8.9–10.3)
CO2: 24 mmol/L (ref 22–32)
CREATININE: 2.8 mg/dL — AB (ref 0.44–1.00)
Chloride: 105 mmol/L (ref 101–111)
GFR calc Af Amer: 18 mL/min — ABNORMAL LOW (ref >60)
GFR calc non Af Amer: 15 mL/min — ABNORMAL LOW (ref >60)
Glucose, Bld: 164 mg/dL — ABNORMAL HIGH (ref 65–99)
Potassium: 3.6 mmol/L (ref 3.5–5.1)
SODIUM: 136 mmol/L (ref 135–145)

## 2018-02-04 LAB — PROTIME-INR
INR: 1.18
PROTHROMBIN TIME: 14.9 s (ref 11.4–15.2)

## 2018-02-04 LAB — APTT: APTT: 31 s (ref 24–36)

## 2018-02-04 MED ORDER — FLUCONAZOLE 150 MG PO TABS: 150.0000 mg | ORAL_TABLET | ORAL | 0 refills | Status: DC

## 2018-02-04 NOTE — Telephone Encounter (Signed)
Pt daughter Susan Ohm(chris) calling regarding her mother. States CS has been seeing pt for prolapse/pessary. She has been having issues with yeast infection. CS had sent in a cream to be used once a week. shes having trouble with the cream for yeast infection and wondering if there was anything else they could try to treat the yeast infection because she feels like she has another one now. CB [phone]

## 2018-02-04 NOTE — Patient Instructions (Signed)
Your procedure is scheduled on:  Wednesday, February 11, 2018 [facility] To find out your arrival time, please call [phone] between 1PM - 3PM on: Tuesday, February 10, 2018  REMEMBER: Instructions that are not followed completely may result in serious medical risk, up to and including death; or upon the discretion of your surgeon and anesthesiologist your surgery may need to be rescheduled.  Do not eat food after midnight the night before your procedure.  No gum chewing, lozengers or hard candies.  You may however, drink CLEAR liquids up to 2 hours before you are scheduled to arrive for your surgery. Do not drink anything within 2 hours of the start of your surgery.  Clear liquids include: - water  - apple juice without pulp - clear gatorade - black coffee or tea (Do NOT add anything to the coffee or tea) Do NOT drink anything that is not on this list.  No Alcohol for 24 hours before or after surgery.  No Smoking including e-cigarettes for 24 hours prior to surgery.  No chewable tobacco products for at least 6 hours prior to surgery.  No nicotine patches on the day of surgery.  On the morning of surgery brush your teeth with toothpaste and water, you may rinse your mouth with mouthwash if you wish. Do not swallow any toothpaste or mouthwash.  Notify your doctor if there is any change in your medical condition (cold, fever, infection).  Do not wear jewelry, make-up, hairpins, clips or nail polish.  Do not wear lotions, powders, or perfumes. You may wear deodorant.  Do not shave 48 hours prior to surgery.   Contacts and dentures may not be worn into surgery.  Do not bring valuables to the hospital, including drivers license, insurance or credit cards.  [facility] is not responsible for any belongings or valuables.   TAKE THESE MEDICATIONS THE MORNING OF SURGERY:  1.  Lorazepam (if needed for anxiety) 2.  Omeprazole (take one  the night before surgery and one the morning of surgery - helps to prevent nausea) 3.  Rifaximin  Use CHG Soap as directed on instruction sheet.  NOW!  Stop Anti-inflammatories (NSAIDS) such as Advil, Aleve, Ibuprofen, Motrin, Naproxen, Naprosyn and Aspirin based products such as Excedrin, Goodys Powder, BC Powder. (May take Tylenol or Acetaminophen if needed.)  NOW!  Stop ANY OVER THE COUNTER supplements until after surgery.  Wear comfortable clothing (specific to your surgery type) to the hospital.  If you are being discharged the day of surgery, you will not be allowed to drive home. You will need a responsible adult to drive you home and stay with you that night.   If you are taking public transportation, you will need to have a responsible adult with you. Please confirm with your physician that it is acceptable to use public transportation.   Please call [phone] if you have any questions about these instructions.

## 2018-02-04 NOTE — Telephone Encounter (Signed)
Pt's daughter, Thayer OhmChris, is returning CS's call.  Please call her at [phone]

## 2018-02-04 NOTE — Telephone Encounter (Signed)
Called and left message.Will send prescription for diflucan, but would like to speak with daughter about the symptoms. Thank you, Dr. Jerene PitchSchuman

## 2018-02-05 ENCOUNTER — Ambulatory Visit
Admission: RE | Admit: 2018-02-05 | Discharge: 2018-02-05 | Disposition: A | Discharge status: Home / Self Care | Source: Ambulatory Visit | Attending: Nephrology | Admitting: Nephrology

## 2018-02-05 DIAGNOSIS — R188 Other ascites: Secondary | ICD-10-CM | POA: Diagnosis present

## 2018-02-05 MED ORDER — ALBUMIN HUMAN 25 % IV SOLN
INTRAVENOUS | Status: AC
  Administered 2018-02-05: 25 g via INTRAVENOUS
  Filled 2018-02-05: qty 100

## 2018-02-05 MED ORDER — ALBUMIN HUMAN 25 % IV SOLN
25.0000 g | Freq: Once | INTRAVENOUS | Status: AC
  Administered 2018-02-05: 25 g via INTRAVENOUS

## 2018-02-05 NOTE — Telephone Encounter (Signed)
Pt's daughter, Thayer OhmChris, calling for CS to return call at [phone] as they are at that # today.  Chris's cell# is [phone]

## 2018-02-05 NOTE — Procedures (Signed)
Pre Procedural Dx: Symptomatic Ascites Post Procedural Dx: Same  Successful US guided paracentesis yielding 4 L of serous ascitic fluid. Sample sent to laboratory as requested.  EBL: None  Complications: None immediate  Jay Maxum Cassarino, MD Pager #: [phone]   

## 2018-02-05 NOTE — Telephone Encounter (Signed)
Spoke with Thayer Ohmhris for over 1 hour about Darel HongJudy and her care. She is having vaginal discharge, non-bothersome to the patient. Recommended betadine or 1/2 water 1/2 apple cider vinegar vaginal douches. The family recently purchased a bidet to help with bottom care. Discussed using vaseline gauze to tuck in rectal prolapse. Patient now having twice a week paracentesis. Would like to see Darel HongJudy in June for a pessary check. - Dr. Jerene PitchSchuman

## 2018-02-09 ENCOUNTER — Ambulatory Visit
Admission: RE | Admit: 2018-02-09 | Discharge: 2018-02-09 | Disposition: A | Discharge status: Home / Self Care | Source: Ambulatory Visit | Attending: Nephrology | Admitting: Nephrology

## 2018-02-09 ENCOUNTER — Other Ambulatory Visit: Payer: Self-pay | Admitting: Nephrology

## 2018-02-09 DIAGNOSIS — R188 Other ascites: Secondary | ICD-10-CM | POA: Insufficient documentation

## 2018-02-09 NOTE — Discharge Instructions (Signed)
Paracentesis, Care After °Refer to this sheet in the next few weeks. These instructions provide you with information about caring for yourself after your procedure. Your health care provider may also give you more specific instructions. Your treatment has been planned according to current medical practices, but problems sometimes occur. Call your health care provider if you have any problems or questions after your procedure. °What can I expect after the procedure? °After your procedure, it is common to have a small amount of clear fluid coming from the puncture site. °Follow these instructions at home: °· Return to your normal activities as told by your health care provider. Ask your health care provider what activities are safe for you. °· Take over-the-counter and prescription medicines only as told by your health care provider. °· Do not take baths, swim, or use a hot tub until your health care provider approves. °· Follow instructions from your health care provider about: °? How to take care of your puncture site. °? When and how you should change your bandage (dressing). °? When you should remove your dressing. °· Check your puncture area every day signs of infection. Watch for: °? Redness, swelling, or pain. °? Fluid, blood, or pus. °· Keep all follow-up visits as told by your health care provider. This is important. °Contact a health care provider if: °· You have redness, swelling, or pain at your puncture site. °· You start to have more clear fluid coming from your puncture site. °· You have blood or pus coming from your puncture site. °· You have chills. °· You have a fever. °Get help right away if: °· You develop chest pain or shortness of breath. °· You develop increasing pain, discomfort, or swelling in your abdomen. °· You feel dizzy or light-headed or you pass out. °This information is not intended to replace advice given to you by your health care provider. Make sure you discuss any questions you  have with your health care provider. °Document Released: 02/21/2015 Document Revised: 03/14/2016 Document Reviewed: 12/20/2014 °Elsevier Interactive Patient Education © 2018 Elsevier Inc. ° °

## 2018-02-11 ENCOUNTER — Encounter: Admission: RE | Payer: Self-pay | Source: Ambulatory Visit

## 2018-02-11 ENCOUNTER — Ambulatory Visit: Admission: RE | Admit: 2018-02-11 | Payer: Medicare Other | Source: Ambulatory Visit | Admitting: Vascular Surgery

## 2018-02-11 ENCOUNTER — Other Ambulatory Visit: Payer: Self-pay | Admitting: Nephrology

## 2018-02-11 DIAGNOSIS — R188 Other ascites: Secondary | ICD-10-CM

## 2018-02-11 LAB — TYPE AND SCREEN
ABO/RH(D): O POS
ANTIBODY SCREEN: NEGATIVE

## 2018-02-11 SURGERY — LAPAROSCOPIC INSERTION CONTINUOUS AMBULATORY PERITONEAL DIALYSIS  (CAPD) CATHETER
Anesthesia: Epidural

## 2018-02-12 ENCOUNTER — Telehealth: Payer: Self-pay

## 2018-02-12 ENCOUNTER — Ambulatory Visit
Admission: RE | Admit: 2018-02-12 | Discharge: 2018-02-12 | Disposition: A | Discharge status: Home / Self Care | Source: Ambulatory Visit | Attending: Nephrology | Admitting: Nephrology

## 2018-02-12 DIAGNOSIS — R188 Other ascites: Secondary | ICD-10-CM | POA: Insufficient documentation

## 2018-02-12 DIAGNOSIS — K7460 Unspecified cirrhosis of liver: Secondary | ICD-10-CM

## 2018-02-12 MED ORDER — ALBUMIN HUMAN 25 % IV SOLN
INTRAVENOUS | Status: AC
  Administered 2018-02-12: 25 g via INTRAVENOUS
  Filled 2018-02-12: qty 100

## 2018-02-12 MED ORDER — ALBUMIN HUMAN 25 % IV SOLN
25.0000 g | Freq: Once | INTRAVENOUS | Status: AC
  Administered 2018-02-12: 25 g via INTRAVENOUS

## 2018-02-12 NOTE — Procedures (Signed)
Interventional Radiology Procedure Note  Procedure: US guided paracentesis.  Complications: None Recommendations:  - Ok to shower tomorrow - Do not submerge for 7 days - Routine care   Signed,  Ming Kunka S. Mykah Bellomo, DO    

## 2018-02-12 NOTE — Telephone Encounter (Signed)
Copied from CRM [id number]. Topic: Inquiry >> Feb 12, 2018  3:42 PM Yvonna Alanisobinson, Andra M wrote: Reason for CRM: [facility]itzi with [facility] [phone]) called requesting permission from Rennie PlowmanMargaret Arnett to prescribe Questran Powder to the patient for itching. The itching is a result of cirrhosis of the liver. Mitzi would like a return call at [phone].       Thank You!!!

## 2018-02-13 ENCOUNTER — Other Ambulatory Visit: Payer: Self-pay | Admitting: Nephrology

## 2018-02-13 DIAGNOSIS — R188 Other ascites: Secondary | ICD-10-CM

## 2018-02-13 MED ORDER — CHOLESTYRAMINE 4 G PO PACK: 4.0000 g | PACK | Freq: Every day | ORAL | 0 refills | Status: DC

## 2018-02-13 NOTE — Telephone Encounter (Signed)
Call mitzi, I have sent in Rx for Questran.

## 2018-02-13 NOTE — Addendum Note (Signed)
Addended by: Allegra GranaARNETT, Carlito Bogert G on: 02/13/2018 09:49 AM   Modules accepted: Orders

## 2018-02-13 NOTE — Telephone Encounter (Signed)
Mitzi advised of below, she wanted you to know that it she adjusted to script to a can so it would covered under Hospice .

## 2018-02-16 ENCOUNTER — Ambulatory Visit
Admission: RE | Admit: 2018-02-16 | Discharge: 2018-02-16 | Disposition: A | Discharge status: Home / Self Care | Source: Ambulatory Visit | Attending: Nephrology | Admitting: Nephrology

## 2018-02-16 DIAGNOSIS — R188 Other ascites: Secondary | ICD-10-CM | POA: Diagnosis present

## 2018-02-16 MED ORDER — ALBUMIN HUMAN 25 % IV SOLN
INTRAVENOUS | Status: AC
  Administered 2018-02-16: 25 g
  Filled 2018-02-16: qty 100

## 2018-02-16 MED ORDER — ALBUMIN HUMAN 25 % IV SOLN: 25.0000 g | Freq: Once | INTRAVENOUS | Status: DC

## 2018-02-16 NOTE — Procedures (Signed)
Pre Procedural Dx: Symptomatic Ascites Post Procedural Dx: Same  Successful US guided paracentesis yielding 4 L of serous ascitic fluid. Sample sent to laboratory as requested.  EBL: None  Complications: None immediate  Jay Raghav Verrilli, MD Pager #: [phone]   

## 2018-02-17 ENCOUNTER — Other Ambulatory Visit: Payer: Self-pay | Admitting: Internal Medicine

## 2018-02-18 NOTE — Telephone Encounter (Signed)
Refilled: 01/27/2018 Last OV: 12/01/2017 Next OV: not scheduled

## 2018-02-18 NOTE — Telephone Encounter (Signed)
Ativan refilled by tullo, she filled 02/17/18 Has she run out? How is she taking ativan?

## 2018-02-19 ENCOUNTER — Other Ambulatory Visit: Payer: Self-pay | Admitting: Nephrology

## 2018-02-19 ENCOUNTER — Ambulatory Visit
Admission: RE | Admit: 2018-02-19 | Discharge: 2018-02-19 | Disposition: A | Discharge status: Home / Self Care | Source: Ambulatory Visit | Attending: Nephrology | Admitting: Nephrology

## 2018-02-19 DIAGNOSIS — R188 Other ascites: Secondary | ICD-10-CM

## 2018-02-19 MED ORDER — ALBUMIN HUMAN 25 % IV SOLN
INTRAVENOUS | Status: AC
  Administered 2018-02-19: 25 g via INTRAVENOUS
  Filled 2018-02-19: qty 100

## 2018-02-19 MED ORDER — ALBUMIN HUMAN 25 % IV SOLN
25.0000 g | Freq: Once | INTRAVENOUS | Status: AC
  Administered 2018-02-19: 25 g via INTRAVENOUS

## 2018-02-19 NOTE — Telephone Encounter (Signed)
Spoke with Thayer Ohm daughter in law , per verbally authorization from patient she just picked up from pharmacy

## 2018-02-23 ENCOUNTER — Ambulatory Visit
Admission: RE | Admit: 2018-02-23 | Discharge: 2018-02-23 | Disposition: A | Discharge status: Home / Self Care | Source: Ambulatory Visit | Attending: Nephrology | Admitting: Nephrology

## 2018-02-23 DIAGNOSIS — R188 Other ascites: Secondary | ICD-10-CM | POA: Insufficient documentation

## 2018-02-25 ENCOUNTER — Other Ambulatory Visit: Payer: Self-pay | Admitting: Nephrology

## 2018-02-25 DIAGNOSIS — R188 Other ascites: Secondary | ICD-10-CM

## 2018-02-26 ENCOUNTER — Ambulatory Visit
Admission: RE | Admit: 2018-02-26 | Discharge: 2018-02-26 | Disposition: A | Discharge status: Home / Self Care | Source: Ambulatory Visit | Attending: Nephrology | Admitting: Nephrology

## 2018-02-26 DIAGNOSIS — R188 Other ascites: Secondary | ICD-10-CM | POA: Insufficient documentation

## 2018-02-26 NOTE — Procedures (Signed)
US guided paracentesis.  Removed 2.4 liters without complication.  No blood loss.

## 2018-03-02 ENCOUNTER — Ambulatory Visit: Admission: RE | Admit: 2018-03-02 | Source: Ambulatory Visit

## 2018-03-05 ENCOUNTER — Ambulatory Visit
Admission: RE | Admit: 2018-03-05 | Discharge: 2018-03-05 | Disposition: A | Discharge status: Home / Self Care | Source: Ambulatory Visit | Attending: Nephrology | Admitting: Nephrology

## 2018-03-05 DIAGNOSIS — R188 Other ascites: Secondary | ICD-10-CM | POA: Insufficient documentation

## 2018-03-05 NOTE — Procedures (Signed)
US guided paracentesis.  Removed 3.7 liters of cloudy amber fluid.  Minimal blood loss and no immediate complication.

## 2018-03-05 NOTE — Discharge Instructions (Signed)
Paracentesis, Care After °Refer to this sheet in the next few weeks. These instructions provide you with information about caring for yourself after your procedure. Your health care provider may also give you more specific instructions. Your treatment has been planned according to current medical practices, but problems sometimes occur. Call your health care provider if you have any problems or questions after your procedure. °What can I expect after the procedure? °After your procedure, it is common to have a small amount of clear fluid coming from the puncture site. °Follow these instructions at home: °· Return to your normal activities as told by your health care provider. Ask your health care provider what activities are safe for you. °· Take over-the-counter and prescription medicines only as told by your health care provider. °· Do not take baths, swim, or use a hot tub until your health care provider approves. °· Follow instructions from your health care provider about: °? How to take care of your puncture site. °? When and how you should change your bandage (dressing). °? When you should remove your dressing. °· Check your puncture area every day signs of infection. Watch for: °? Redness, swelling, or pain. °? Fluid, blood, or pus. °· Keep all follow-up visits as told by your health care provider. This is important. °Contact a health care provider if: °· You have redness, swelling, or pain at your puncture site. °· You start to have more clear fluid coming from your puncture site. °· You have blood or pus coming from your puncture site. °· You have chills. °· You have a fever. °Get help right away if: °· You develop chest pain or shortness of breath. °· You develop increasing pain, discomfort, or swelling in your abdomen. °· You feel dizzy or light-headed or you pass out. °This information is not intended to replace advice given to you by your health care provider. Make sure you discuss any questions you  have with your health care provider. °Document Released: 02/21/2015 Document Revised: 03/14/2016 Document Reviewed: 12/20/2014 °Elsevier Interactive Patient Education © 2018 Elsevier Inc. ° °

## 2018-03-09 ENCOUNTER — Ambulatory Visit
Admission: RE | Admit: 2018-03-09 | Discharge: 2018-03-09 | Disposition: A | Discharge status: Home / Self Care | Source: Ambulatory Visit | Attending: Nephrology | Admitting: Nephrology

## 2018-03-09 ENCOUNTER — Other Ambulatory Visit: Payer: Self-pay | Admitting: Nephrology

## 2018-03-09 DIAGNOSIS — R188 Other ascites: Secondary | ICD-10-CM

## 2018-03-12 ENCOUNTER — Ambulatory Visit
Admission: RE | Admit: 2018-03-12 | Discharge: 2018-03-12 | Disposition: A | Discharge status: Home / Self Care | Source: Ambulatory Visit | Attending: Nephrology | Admitting: Nephrology

## 2018-03-12 ENCOUNTER — Other Ambulatory Visit: Payer: Self-pay | Admitting: Nephrology

## 2018-03-12 DIAGNOSIS — R188 Other ascites: Secondary | ICD-10-CM

## 2018-03-12 MED ORDER — ALBUMIN HUMAN 25 % IV SOLN
INTRAVENOUS | Status: AC
  Administered 2018-03-12: 25 g via INTRAVENOUS
  Filled 2018-03-12: qty 100

## 2018-03-12 MED ORDER — ALBUMIN HUMAN 25 % IV SOLN
25.0000 g | Freq: Once | INTRAVENOUS | Status: AC
  Administered 2018-03-12: 25 g via INTRAVENOUS

## 2018-03-12 NOTE — Discharge Instructions (Signed)
Paracentesis, Care After °Refer to this sheet in the next few weeks. These instructions provide you with information about caring for yourself after your procedure. Your health care provider may also give you more specific instructions. Your treatment has been planned according to current medical practices, but problems sometimes occur. Call your health care provider if you have any problems or questions after your procedure. °What can I expect after the procedure? °After your procedure, it is common to have a small amount of clear fluid coming from the puncture site. °Follow these instructions at home: °· Return to your normal activities as told by your health care provider. Ask your health care provider what activities are safe for you. °· Take over-the-counter and prescription medicines only as told by your health care provider. °· Do not take baths, swim, or use a hot tub until your health care provider approves. °· Follow instructions from your health care provider about: °? How to take care of your puncture site. °? When and how you should change your bandage (dressing). °? When you should remove your dressing. °· Check your puncture area every day signs of infection. Watch for: °? Redness, swelling, or pain. °? Fluid, blood, or pus. °· Keep all follow-up visits as told by your health care provider. This is important. °Contact a health care provider if: °· You have redness, swelling, or pain at your puncture site. °· You start to have more clear fluid coming from your puncture site. °· You have blood or pus coming from your puncture site. °· You have chills. °· You have a fever. °Get help right away if: °· You develop chest pain or shortness of breath. °· You develop increasing pain, discomfort, or swelling in your abdomen. °· You feel dizzy or light-headed or you pass out. °This information is not intended to replace advice given to you by your health care provider. Make sure you discuss any questions you  have with your health care provider. °Document Released: 02/21/2015 Document Revised: 03/14/2016 Document Reviewed: 12/20/2014 °Elsevier Interactive Patient Education © 2018 Elsevier Inc. ° °

## 2018-03-13 NOTE — Discharge Instructions (Signed)
Paracentesis, Care After °Refer to this sheet in the next few weeks. These instructions provide you with information about caring for yourself after your procedure. Your health care provider may also give you more specific instructions. Your treatment has been planned according to current medical practices, but problems sometimes occur. Call your health care provider if you have any problems or questions after your procedure. °What can I expect after the procedure? °After your procedure, it is common to have a small amount of clear fluid coming from the puncture site. °Follow these instructions at home: °· Return to your normal activities as told by your health care provider. Ask your health care provider what activities are safe for you. °· Take over-the-counter and prescription medicines only as told by your health care provider. °· Do not take baths, swim, or use a hot tub until your health care provider approves. °· Follow instructions from your health care provider about: °? How to take care of your puncture site. °? When and how you should change your bandage (dressing). °? When you should remove your dressing. °· Check your puncture area every day signs of infection. Watch for: °? Redness, swelling, or pain. °? Fluid, blood, or pus. °· Keep all follow-up visits as told by your health care provider. This is important. °Contact a health care provider if: °· You have redness, swelling, or pain at your puncture site. °· You start to have more clear fluid coming from your puncture site. °· You have blood or pus coming from your puncture site. °· You have chills. °· You have a fever. °Get help right away if: °· You develop chest pain or shortness of breath. °· You develop increasing pain, discomfort, or swelling in your abdomen. °· You feel dizzy or light-headed or you pass out. °This information is not intended to replace advice given to you by your health care provider. Make sure you discuss any questions you  have with your health care provider. °Document Released: 02/21/2015 Document Revised: 03/14/2016 Document Reviewed: 12/20/2014 °Elsevier Interactive Patient Education © 2018 Elsevier Inc. ° °

## 2018-03-17 ENCOUNTER — Other Ambulatory Visit: Payer: Self-pay | Admitting: Nephrology

## 2018-03-17 ENCOUNTER — Ambulatory Visit
Admission: RE | Admit: 2018-03-17 | Discharge: 2018-03-17 | Disposition: A | Discharge status: Home / Self Care | Source: Ambulatory Visit | Attending: Nephrology | Admitting: Nephrology

## 2018-03-17 DIAGNOSIS — R188 Other ascites: Secondary | ICD-10-CM

## 2018-03-17 MED ORDER — ALBUMIN HUMAN 25 % IV SOLN: 12.5000 g | Freq: Once | INTRAVENOUS | Status: DC

## 2018-03-17 MED ORDER — ALBUMIN HUMAN 25 % IV SOLN
25.0000 g | Freq: Once | INTRAVENOUS | Status: AC
  Administered 2018-03-17: 25 g via INTRAVENOUS

## 2018-03-17 MED ORDER — ALBUMIN HUMAN 25 % IV SOLN
INTRAVENOUS | Status: AC
  Filled 2018-03-17: qty 100

## 2018-03-17 NOTE — Procedures (Signed)
Pre Procedural Dx: Symptomatic Ascites Post Procedural Dx: Same  Successful US guided paracentesis yielding 4 L of serous ascitic fluid.  EBL: None Complications: None immediate  Jay Darren Nodal, MD Pager #: [phone]   

## 2018-03-19 ENCOUNTER — Ambulatory Visit
Admission: RE | Admit: 2018-03-19 | Discharge: 2018-03-19 | Disposition: A | Discharge status: Home / Self Care | Source: Ambulatory Visit | Attending: Nephrology | Admitting: Nephrology

## 2018-03-19 DIAGNOSIS — R188 Other ascites: Secondary | ICD-10-CM | POA: Insufficient documentation

## 2018-03-19 NOTE — Procedures (Signed)
Pre Procedural Dx: Symptomatic Ascites Post Procedural Dx: Same  Successful US guided paracentesis yielding 3.8 L of serous ascitic fluid.  EBL: None  Complications: None immediate  Jay Kenda Kloehn, MD Pager #: [phone]   

## 2018-03-23 ENCOUNTER — Ambulatory Visit
Admission: RE | Admit: 2018-03-23 | Discharge: 2018-03-23 | Disposition: A | Discharge status: Home / Self Care | Source: Ambulatory Visit | Attending: Nephrology | Admitting: Nephrology

## 2018-03-23 DIAGNOSIS — R188 Other ascites: Secondary | ICD-10-CM | POA: Insufficient documentation

## 2018-03-23 NOTE — Procedures (Signed)
Pre Procedural Dx: Symptomatic Ascites Post Procedural Dx: Same  Successful US guided paracentesis yielding 3.1 L of serous ascitic fluid. Sample sent to laboratory as requested.  EBL: None  Complications: None immediate  Jay Gergory Biello, MD Pager #: [phone]   

## 2018-03-25 ENCOUNTER — Telehealth: Payer: Self-pay | Admitting: Family

## 2018-03-25 ENCOUNTER — Other Ambulatory Visit: Payer: Self-pay | Admitting: Internal Medicine

## 2018-03-25 DIAGNOSIS — F411 Generalized anxiety disorder: Secondary | ICD-10-CM

## 2018-03-25 NOTE — Telephone Encounter (Signed)
Copied from CRM [id number]. Topic: Quick Communication - Rx Refill/Question >> Mar 25, 2018  4:02 PM Louie BunPalacios Medina, Rosey Batheresa D wrote: Medication: Morphine Sulfate (MORPHINE CONCENTRATE) 10 mg / 0.5 ml concentrated solution  Has the patient contacted their pharmacy? Yes (Agent: If no, request that the patient contact the pharmacy for the refill.) (Agent: If yes, when and what did the pharmacy advise?)  Preferred Pharmacy (with phone number or street name): [facility] - [place], [place] - [address]  Agent: Please be advised that RX refills may take up to 3 business days. We ask that you follow-up with your pharmacy.

## 2018-03-26 ENCOUNTER — Ambulatory Visit
Admission: RE | Admit: 2018-03-26 | Discharge: 2018-03-26 | Disposition: A | Discharge status: Home / Self Care | Source: Ambulatory Visit | Attending: Nephrology | Admitting: Nephrology

## 2018-03-26 DIAGNOSIS — R188 Other ascites: Secondary | ICD-10-CM | POA: Diagnosis not present

## 2018-03-26 NOTE — Telephone Encounter (Signed)
It looks like this medication was prescribed by another provider, Would [name] refill?

## 2018-03-26 NOTE — Telephone Encounter (Signed)
Morphine Sulfate refill Last Refill:02/04/18 by historical provider Last OV: 12/01/17 PCP: Susan BarriosMargaret Arnett,NP Pharmacy:[facility]  [place]    [address]

## 2018-03-26 NOTE — Telephone Encounter (Signed)
Refilled: 01/27/2018 Last OV: 12/01/2017 Next OV: not scheduled

## 2018-03-26 NOTE — Procedures (Signed)
RECURRENT ASCITES  S/p US 2L PARA  No comp Stable EBL 0 Full report in pacs

## 2018-03-27 ENCOUNTER — Encounter: Payer: Self-pay | Admitting: Obstetrics and Gynecology

## 2018-03-27 ENCOUNTER — Ambulatory Visit: Payer: Medicare Other | Admitting: Obstetrics and Gynecology

## 2018-03-27 VITALS — BP 112/72 | HR 115 | Ht 63.0 in | Wt 91.0 lb

## 2018-03-27 DIAGNOSIS — K623 Rectal prolapse: Secondary | ICD-10-CM

## 2018-03-27 DIAGNOSIS — N8110 Cystocele, unspecified: Secondary | ICD-10-CM

## 2018-03-27 NOTE — Telephone Encounter (Signed)
Patient in hospice care  Assessed [place] Controlled Substance Reporting System and did not see suspicious activity at this time under patient's name and address in Epic.

## 2018-03-27 NOTE — Progress Notes (Signed)
Patient is doing well.  She is having rectal pain from prolapsed rectum. Discussed gauze application, will delay pessary change for 2 months.   Adelene Idlerhristanna Aayan Haskew MD [facility], [facility] 03/27/18 2:45 PM

## 2018-03-30 ENCOUNTER — Telehealth: Payer: Self-pay

## 2018-03-30 ENCOUNTER — Other Ambulatory Visit: Payer: Self-pay | Admitting: Nephrology

## 2018-03-30 ENCOUNTER — Ambulatory Visit
Admission: RE | Admit: 2018-03-30 | Discharge: 2018-03-30 | Disposition: A | Discharge status: Home / Self Care | Source: Ambulatory Visit | Attending: Nephrology | Admitting: Nephrology

## 2018-03-30 DIAGNOSIS — R188 Other ascites: Secondary | ICD-10-CM

## 2018-03-30 MED ORDER — MORPHINE SULFATE (CONCENTRATE) 10 MG /0.5 ML PO SOLN: 5.0000 mg | ORAL | 0 refills | Status: DC | PRN

## 2018-03-30 NOTE — Telephone Encounter (Signed)
Copied from CRM [id number]. Topic: General - Other >> Mar 30, 2018  4:44 PM Debroah LoopLander, Lumin L wrote: Reason for CRM: Mitzi, [facility] calling to notify NP Arnett that the patient is getting more nauseous so  Zofran has been increased to 4mg  every 4hours as needed.

## 2018-03-30 NOTE — Telephone Encounter (Signed)
Spoke with Mitzi this is just [employer]

## 2018-03-30 NOTE — Telephone Encounter (Signed)
Left voice mail for patient to call back ok for [facility] to speak to patient    

## 2018-03-30 NOTE — Telephone Encounter (Signed)
Per Mitzi , Hospice  son Dwayne aware .

## 2018-03-30 NOTE — Telephone Encounter (Signed)
Call pt Let her know I refilled    Notes for me: Hospice patient  Dosing reviewed with supervising, Dr Duncan Dulleresa Tullo, and she and I jointly agreed on management plan.

## 2018-03-31 NOTE — Discharge Instructions (Signed)
Paracentesis, Care After °Refer to this sheet in the next few weeks. These instructions provide you with information about caring for yourself after your procedure. Your health care provider may also give you more specific instructions. Your treatment has been planned according to current medical practices, but problems sometimes occur. Call your health care provider if you have any problems or questions after your procedure. °What can I expect after the procedure? °After your procedure, it is common to have a small amount of clear fluid coming from the puncture site. °Follow these instructions at home: °· Return to your normal activities as told by your health care provider. Ask your health care provider what activities are safe for you. °· Take over-the-counter and prescription medicines only as told by your health care provider. °· Do not take baths, swim, or use a hot tub until your health care provider approves. °· Follow instructions from your health care provider about: °? How to take care of your puncture site. °? When and how you should change your bandage (dressing). °? When you should remove your dressing. °· Check your puncture area every day signs of infection. Watch for: °? Redness, swelling, or pain. °? Fluid, blood, or pus. °· Keep all follow-up visits as told by your health care provider. This is important. °Contact a health care provider if: °· You have redness, swelling, or pain at your puncture site. °· You start to have more clear fluid coming from your puncture site. °· You have blood or pus coming from your puncture site. °· You have chills. °· You have a fever. °Get help right away if: °· You develop chest pain or shortness of breath. °· You develop increasing pain, discomfort, or swelling in your abdomen. °· You feel dizzy or light-headed or you pass out. °This information is not intended to replace advice given to you by your health care provider. Make sure you discuss any questions you  have with your health care provider. °Document Released: 02/21/2015 Document Revised: 03/14/2016 Document Reviewed: 12/20/2014 °Elsevier Interactive Patient Education © 2018 Elsevier Inc. ° °

## 2018-04-01 NOTE — Telephone Encounter (Signed)
noted 

## 2018-04-02 ENCOUNTER — Ambulatory Visit
Admission: RE | Admit: 2018-04-02 | Discharge: 2018-04-02 | Disposition: A | Discharge status: Home / Self Care | Source: Ambulatory Visit | Attending: Nephrology | Admitting: Nephrology

## 2018-04-02 DIAGNOSIS — R188 Other ascites: Secondary | ICD-10-CM | POA: Diagnosis not present

## 2018-04-02 MED ORDER — ALBUMIN HUMAN 25 % IV SOLN
INTRAVENOUS | Status: AC
  Filled 2018-04-02: qty 100

## 2018-04-02 MED ORDER — ONDANSETRON HCL 4 MG/2ML IJ SOLN: 4.0000 mg | INTRAMUSCULAR | Status: DC | PRN

## 2018-04-02 MED ORDER — ONDANSETRON HCL 4 MG/2ML IJ SOLN
INTRAMUSCULAR | Status: AC
  Administered 2018-04-02: 4 mg via INTRAVENOUS
  Filled 2018-04-02: qty 2

## 2018-04-02 MED ORDER — ALBUMIN HUMAN 25 % IV SOLN
25.0000 g | Freq: Once | INTRAVENOUS | Status: AC
  Administered 2018-04-02: 25 g via INTRAVENOUS

## 2018-04-06 ENCOUNTER — Ambulatory Visit
Admission: RE | Admit: 2018-04-06 | Discharge: 2018-04-06 | Disposition: A | Discharge status: Home / Self Care | Source: Ambulatory Visit | Attending: Nephrology | Admitting: Nephrology

## 2018-04-06 DIAGNOSIS — R188 Other ascites: Secondary | ICD-10-CM | POA: Diagnosis not present

## 2018-04-06 NOTE — Procedures (Signed)
US guided paracentesis.  Removed 3.2 liters of amber colored fluid.  Minima blood loss and no immediate complication.

## 2018-04-08 ENCOUNTER — Other Ambulatory Visit: Payer: Self-pay | Admitting: Nephrology

## 2018-04-08 DIAGNOSIS — R188 Other ascites: Secondary | ICD-10-CM

## 2018-04-09 ENCOUNTER — Other Ambulatory Visit: Payer: Self-pay | Admitting: Nephrology

## 2018-04-09 ENCOUNTER — Ambulatory Visit
Admission: RE | Admit: 2018-04-09 | Discharge: 2018-04-09 | Disposition: A | Discharge status: Home / Self Care | Source: Ambulatory Visit | Attending: Nephrology | Admitting: Nephrology

## 2018-04-09 DIAGNOSIS — R188 Other ascites: Secondary | ICD-10-CM | POA: Insufficient documentation

## 2018-04-13 ENCOUNTER — Ambulatory Visit
Admission: RE | Admit: 2018-04-13 | Discharge: 2018-04-13 | Disposition: A | Discharge status: Home / Self Care | Source: Ambulatory Visit | Attending: Nephrology | Admitting: Nephrology

## 2018-04-13 DIAGNOSIS — R188 Other ascites: Secondary | ICD-10-CM | POA: Insufficient documentation

## 2018-04-13 MED ORDER — ALBUMIN HUMAN 25 % IV SOLN
INTRAVENOUS | Status: AC
  Filled 2018-04-13: qty 100

## 2018-04-13 MED ORDER — ALBUMIN HUMAN 25 % IV SOLN
25.0000 g | Freq: Once | INTRAVENOUS | Status: AC
  Administered 2018-04-13: 25 g via INTRAVENOUS

## 2018-04-13 NOTE — Progress Notes (Signed)
Albumin infusion complete. Pt. Tolerated well. DC instructions reviewed with pt. With verbalized understanding. Stable for DC home with care giver.

## 2018-04-13 NOTE — Progress Notes (Signed)
Pt. Brought from Ultrasound (post paracentesis) to [facility] for Albumin Infusion. Pt. sittting up in lounge chair. Given snack and drink. IV infusing without difficulties at present.

## 2018-04-13 NOTE — Procedures (Signed)
Recurrent ascites  S/p lg vol para  No comp Stable Full report in pacs

## 2018-04-14 NOTE — Discharge Instructions (Signed)
Paracentesis, Care After °Refer to this sheet in the next few weeks. These instructions provide you with information about caring for yourself after your procedure. Your health care provider may also give you more specific instructions. Your treatment has been planned according to current medical practices, but problems sometimes occur. Call your health care provider if you have any problems or questions after your procedure. °What can I expect after the procedure? °After your procedure, it is common to have a small amount of clear fluid coming from the puncture site. °Follow these instructions at home: °· Return to your normal activities as told by your health care provider. Ask your health care provider what activities are safe for you. °· Take over-the-counter and prescription medicines only as told by your health care provider. °· Do not take baths, swim, or use a hot tub until your health care provider approves. °· Follow instructions from your health care provider about: °? How to take care of your puncture site. °? When and how you should change your bandage (dressing). °? When you should remove your dressing. °· Check your puncture area every day signs of infection. Watch for: °? Redness, swelling, or pain. °? Fluid, blood, or pus. °· Keep all follow-up visits as told by your health care provider. This is important. °Contact a health care provider if: °· You have redness, swelling, or pain at your puncture site. °· You start to have more clear fluid coming from your puncture site. °· You have blood or pus coming from your puncture site. °· You have chills. °· You have a fever. °Get help right away if: °· You develop chest pain or shortness of breath. °· You develop increasing pain, discomfort, or swelling in your abdomen. °· You feel dizzy or light-headed or you pass out. °This information is not intended to replace advice given to you by your health care provider. Make sure you discuss any questions you  have with your health care provider. °Document Released: 02/21/2015 Document Revised: 03/14/2016 Document Reviewed: 12/20/2014 °Elsevier Interactive Patient Education © 2018 Elsevier Inc. ° °

## 2018-04-16 ENCOUNTER — Ambulatory Visit
Admission: RE | Admit: 2018-04-16 | Discharge: 2018-04-16 | Disposition: A | Discharge status: Home / Self Care | Source: Ambulatory Visit | Attending: Nephrology | Admitting: Nephrology

## 2018-04-16 DIAGNOSIS — R188 Other ascites: Secondary | ICD-10-CM | POA: Insufficient documentation

## 2018-04-16 NOTE — Procedures (Signed)
US guided paracentesis.  Removed 3.4 liters of cloudy yellow fluid.  Minima blood loss and no immediate complication.

## 2018-04-17 ENCOUNTER — Other Ambulatory Visit: Payer: Self-pay | Admitting: Family

## 2018-04-17 DIAGNOSIS — K7460 Unspecified cirrhosis of liver: Secondary | ICD-10-CM

## 2018-04-20 ENCOUNTER — Other Ambulatory Visit: Payer: Self-pay | Admitting: Nephrology

## 2018-04-20 ENCOUNTER — Ambulatory Visit
Admission: RE | Admit: 2018-04-20 | Discharge: 2018-04-20 | Disposition: A | Discharge status: Home / Self Care | Source: Ambulatory Visit | Attending: Nephrology | Admitting: Nephrology

## 2018-04-20 DIAGNOSIS — R188 Other ascites: Secondary | ICD-10-CM | POA: Diagnosis not present

## 2018-04-20 MED ORDER — ALBUMIN HUMAN 25 % IV SOLN
25.0000 g | Freq: Once | INTRAVENOUS | Status: AC
  Administered 2018-04-20: 25 g via INTRAVENOUS

## 2018-04-20 MED ORDER — ALBUMIN HUMAN 25 % IV SOLN
INTRAVENOUS | Status: AC
  Administered 2018-04-20: 25 g via INTRAVENOUS
  Filled 2018-04-20: qty 100

## 2018-04-20 NOTE — Telephone Encounter (Signed)
Last refill:04/08/18

## 2018-04-24 ENCOUNTER — Ambulatory Visit
Admission: RE | Admit: 2018-04-24 | Discharge: 2018-04-24 | Disposition: A | Discharge status: Home / Self Care | Source: Ambulatory Visit | Attending: Nephrology | Admitting: Nephrology

## 2018-04-24 DIAGNOSIS — R188 Other ascites: Secondary | ICD-10-CM | POA: Insufficient documentation

## 2018-04-26 ENCOUNTER — Emergency Department
Admission: EM | Admit: 2018-04-26 | Discharge: 2018-04-26 | Disposition: A | Discharge status: Home / Self Care | Attending: Emergency Medicine | Admitting: Emergency Medicine

## 2018-04-26 ENCOUNTER — Encounter: Payer: Self-pay | Admitting: Emergency Medicine

## 2018-04-26 ENCOUNTER — Emergency Department

## 2018-04-26 ENCOUNTER — Other Ambulatory Visit: Payer: Self-pay

## 2018-04-26 DIAGNOSIS — N185 Chronic kidney disease, stage 5: Secondary | ICD-10-CM | POA: Diagnosis not present

## 2018-04-26 DIAGNOSIS — Z79899 Other long term (current) drug therapy: Secondary | ICD-10-CM | POA: Insufficient documentation

## 2018-04-26 DIAGNOSIS — R188 Other ascites: Secondary | ICD-10-CM

## 2018-04-26 DIAGNOSIS — E860 Dehydration: Secondary | ICD-10-CM | POA: Insufficient documentation

## 2018-04-26 DIAGNOSIS — I120 Hypertensive chronic kidney disease with stage 5 chronic kidney disease or end stage renal disease: Secondary | ICD-10-CM | POA: Diagnosis not present

## 2018-04-26 DIAGNOSIS — R112 Nausea with vomiting, unspecified: Secondary | ICD-10-CM

## 2018-04-26 DIAGNOSIS — K7460 Unspecified cirrhosis of liver: Secondary | ICD-10-CM | POA: Insufficient documentation

## 2018-04-26 DIAGNOSIS — R1110 Vomiting, unspecified: Secondary | ICD-10-CM | POA: Diagnosis present

## 2018-04-26 LAB — LIPASE, BLOOD: Lipase: 62 U/L — ABNORMAL HIGH (ref 11–51)

## 2018-04-26 LAB — COMPREHENSIVE METABOLIC PANEL
ALT: 29 U/L (ref 0–44)
AST: 67 U/L — AB (ref 15–41)
Albumin: 2.7 g/dL — ABNORMAL LOW (ref 3.5–5.0)
Alkaline Phosphatase: 163 U/L — ABNORMAL HIGH (ref 38–126)
Anion gap: 15 (ref 5–15)
BILIRUBIN TOTAL: 2.1 mg/dL — AB (ref 0.3–1.2)
BUN: 64 mg/dL — ABNORMAL HIGH (ref 8–23)
CALCIUM: 8.6 mg/dL — AB (ref 8.9–10.3)
CO2: 26 mmol/L (ref 22–32)
Chloride: 92 mmol/L — ABNORMAL LOW (ref 98–111)
Creatinine, Ser: 4.13 mg/dL — ABNORMAL HIGH (ref 0.44–1.00)
GFR calc Af Amer: 11 mL/min — ABNORMAL LOW (ref >60)
GFR, EST NON AFRICAN AMERICAN: 10 mL/min — AB (ref >60)
GLUCOSE: 173 mg/dL — AB (ref 70–99)
Potassium: 3.6 mmol/L (ref 3.5–5.1)
Sodium: 133 mmol/L — ABNORMAL LOW (ref 135–145)
TOTAL PROTEIN: 6.9 g/dL (ref 6.5–8.1)

## 2018-04-26 LAB — CBC
HCT: 37.8 % (ref 35.0–47.0)
HEMOGLOBIN: 13.1 g/dL (ref 12.0–16.0)
MCH: 31.4 pg (ref 26.0–34.0)
MCHC: 34.6 g/dL (ref 32.0–36.0)
MCV: 90.8 fL (ref 80.0–100.0)
Platelets: 231 10*3/uL (ref 150–440)
RBC: 4.16 MIL/uL (ref 3.80–5.20)
RDW: 15.6 % — AB (ref 11.5–14.5)
WBC: 13.6 10*3/uL — ABNORMAL HIGH (ref 3.6–11.0)

## 2018-04-26 LAB — AMMONIA: AMMONIA: 25 umol/L (ref 9–35)

## 2018-04-26 MED ORDER — FAMOTIDINE IN NACL 20-0.9 MG/50ML-% IV SOLN
20.0000 mg | Freq: Once | INTRAVENOUS | Status: AC
  Administered 2018-04-26: 20 mg via INTRAVENOUS
  Filled 2018-04-26: qty 50

## 2018-04-26 MED ORDER — LOPERAMIDE HCL 2 MG PO TABS: 4.0000 mg | ORAL_TABLET | Freq: Four times a day (QID) | ORAL | 0 refills | Status: AC | PRN

## 2018-04-26 MED ORDER — METOCLOPRAMIDE HCL 10 MG PO TABS: 10.0000 mg | ORAL_TABLET | Freq: Four times a day (QID) | ORAL | 0 refills | Status: DC | PRN

## 2018-04-26 MED ORDER — METOCLOPRAMIDE HCL 5 MG/ML IJ SOLN
10.0000 mg | Freq: Once | INTRAMUSCULAR | Status: AC
  Administered 2018-04-26: 10 mg via INTRAVENOUS
  Filled 2018-04-26: qty 2

## 2018-04-26 MED ORDER — SODIUM CHLORIDE 0.9 % IV SOLN
1000.0000 mL | Freq: Once | INTRAVENOUS | Status: AC
  Administered 2018-04-26: 1000 mL via INTRAVENOUS

## 2018-04-26 MED ORDER — SODIUM CHLORIDE 0.9 % IV BOLUS
500.0000 mL | Freq: Once | INTRAVENOUS | Status: AC
  Administered 2018-04-26: 500 mL via INTRAVENOUS

## 2018-04-26 MED ORDER — ONDANSETRON HCL 4 MG/2ML IJ SOLN
4.0000 mg | Freq: Once | INTRAMUSCULAR | Status: AC
Start: 2018-04-26 — End: 2018-04-26
  Administered 2018-04-26: 4 mg via INTRAVENOUS
  Filled 2018-04-26: qty 2

## 2018-04-26 NOTE — ED Provider Notes (Signed)
[facility] Emergency Department Provider Note  ____________________________________________  Time seen: Approximately 6:56 PM  I have reviewed the triage vital signs and the nursing notes.   HISTORY  Chief Complaint Emesis    HPI Susan Greer is a 77 y.o. female with a history of CKD, liver cirrhosis requiring frequent paracentesis hypertension and palliative care under hospice care who complains of vomiting that started this morning.  Does report some right lower quadrant abdominal pain as well.  No fevers chills or sweats.  No recent falls or trauma.  No dysuria frequency urgency.  No diarrhea or constipation.  Pain is constant, mild intensity, waxing waning, nonradiating, no aggravating or alleviating factors.      Past Medical History:  Diagnosis Date  . Cellulitis of left leg   . Chronic kidney disease (CKD), stage V (HCC)   . Cirrhosis of liver (HCC)   . GERD (gastroesophageal reflux disease)   . History of kidney stones   . Hypertension   . Kidney stones   . SVT (supraventricular tachycardia) (HCC)    Dr. Lady Gary at [place], s/p adenosine  . UTI (urinary tract infection)      Patient Active Problem List   Diagnosis Date Noted  . CKD (chronic kidney disease) stage 5, GFR less than 15 ml/min (HCC) 01/23/2018  . Partial rectal prolapse 12/04/2017  . Hospice care 12/04/2017  . Hemorrhoids 12/01/2017  . Acute renal failure (HCC)   . Palliative care by specialist   . Ascites   . AKI (acute kidney injury) (HCC) 11/17/2017  . Cellulitis 07/29/2017  . Diarrhea 07/08/2017  . History of shingles 06/03/2016  . Cirrhosis of liver (HCC) 06/03/2016  . Elevated LFTs 01/11/2016  . Macular degeneration, left eye 01/11/2016  . Vaginal prolapse 01/11/2016  . Goals of care, counseling/discussion 02/23/2014  . Depression 02/23/2014  . Anxiety state 02/23/2014  . Essential hypertension, benign 04/12/2013  . Female bladder prolapse 04/12/2013     Past  Surgical History:  Procedure Laterality Date  . ABDOMINAL HYSTERECTOMY     Dr. Weston Anna, for fibroid tumor and endometriosis  . ESOPHAGOGASTRODUODENOSCOPY (EGD) WITH PROPOFOL N/A 01/27/2017   Procedure: ESOPHAGOGASTRODUODENOSCOPY (EGD) WITH PROPOFOL;  Surgeon: Scot Jun, MD;  Location: [facility] ENDOSCOPY;  Service: Endoscopy;  Laterality: N/A;     Prior to Admission medications   Medication Sig Start Date End Date Taking? Authorizing Provider  cholestyramine (QUESTRAN) 4 g packet Take 1 packet (4 g total) by mouth daily. Patient not taking: Reported on 03/27/2018 02/13/18   Allegra Grana, FNP  conjugated estrogens (PREMARIN) vaginal cream Place 1 Applicatorful vaginally daily. 11/25/17   Gouru, Deanna Artis, MD  docusate sodium (COLACE) 100 MG capsule TAKE 1 CAPSULE BY MOUTH TWICE DAILY AS NEEDED CONTSIPATION 02/18/18   Sherlene Shams, MD  loperamide (IMODIUM A-D) 2 MG tablet Take 2 tablets (4 mg total) by mouth 4 (four) times daily as needed for diarrhea or loose stools. 04/26/18   Sharman Cheek, MD  LORazepam (ATIVAN) 0.5 MG tablet TAKE 1 TABLET BY MOUTH TWICE DAILY AS NEEDED ANXIETY 03/27/18   Allegra Grana, FNP  metoCLOPramide (REGLAN) 10 MG tablet Take 1 tablet (10 mg total) by mouth every 6 (six) hours as needed. 04/26/18   Sharman Cheek, MD  Morphine Sulfate (MORPHINE CONCENTRATE) 10 mg / 0.5 ml concentrated solution Take 0.25 mLs (5 mg total) by mouth every 2 (two) hours as needed for severe pain. 03/30/18   Allegra Grana, FNP  ondansetron [facility])  4 MG tablet TAKE 1 TABLET BY MOUTH EVERY 8 HOURS AS NEEDED NAUSEA AND VOMITING 04/20/18   Allegra GranaArnett, Margaret G, FNP  polyethylene glycol (MIRALAX / GLYCOLAX) packet Take 17 g by mouth daily as needed for mild constipation. 11/24/17   Ramonita LabGouru, Aruna, MD  polyvinyl alcohol (LIQUIFILM TEARS) 1.4 % ophthalmic solution Place 1 drop into both eyes as needed for dry eyes. 11/24/17   Gouru, Deanna ArtisAruna, MD  rifaximin (XIFAXAN) 550 MG TABS tablet Take 550 mg by  mouth 2 (two) times daily.  10/08/17   [provider]  sodium bicarbonate 650 MG tablet Take 650 mg by mouth 2 (two) times daily.    [provider]  terconazole (TERAZOL 3) 0.8 % vaginal cream Place 20 applicators vaginally once a week. 12/22/17   Natale MilchSchuman, Christanna R, MD  witch hazel-glycerin (TUCKS) pad Apply topically as needed for itching. 11/24/17   Ramonita LabGouru, Aruna, MD     Allergies Tape   Family History  Problem Relation Age of Onset  . Hypertension Mother   . Cancer Mother 588       breast  . Hypertension Father   . Pneumonia Father   . Diabetes Brother   . Hypertension Brother   . Cancer Sister [phone]       breast    Social History Social History   Tobacco Use  . Smoking status: Never Smoker  . Smokeless tobacco: Never Used  Substance Use Topics  . Alcohol use: No  . Drug use: No    Review of Systems  Constitutional:   No fever or chills.  ENT:   No sore throat. No rhinorrhea. Cardiovascular:   No chest pain or syncope. Respiratory:   No dyspnea or cough. Gastrointestinal:   Positive as above for abdominal pain and vomiting Musculoskeletal:   Negative for focal pain or swelling All other systems reviewed and are negative except as documented above in ROS and HPI.  ____________________________________________   PHYSICAL EXAM:  VITAL SIGNS: ED Triage Vitals  Enc Vitals Group     BP 04/26/18 1402 (!) 149/56     Pulse Rate 04/26/18 1402 (!) 123     Resp 04/26/18 1402 18     Temp 04/26/18 1402 97.9 F (36.6 C)     Temp Source 04/26/18 1402 Oral     SpO2 04/26/18 1402 97 %     Weight 04/26/18 1405 91 lb (41.3 kg)     Height 04/26/18 1405 5\' 2"  (1.575 m)     Head Circumference --      Peak Flow --      Pain Score 04/26/18 1405 0     Pain Loc --      Pain Edu? --      Excl. in GC? --     Vital signs reviewed, nursing assessments reviewed.   Constitutional:   Alert and oriented.  Eyes:   Conjunctivae are normal. EOMI. PERRL. ENT       Head:   Normocephalic and atraumatic.      Nose:   No congestion/rhinnorhea.       Mouth/Throat:   Mucous membranes, no pharyngeal erythema. No peritonsillar mass.       Neck:   No meningismus. Full ROM. Hematological/Lymphatic/Immunilogical:   No cervical lymphadenopathy. Cardiovascular:   Tachycardia heart rate 110. Symmetric bilateral radial and DP pulses.  No murmurs.  Respiratory:   Normal respiratory effort without tachypnea/retractions. Breath sounds are clear and equal bilaterally. No wheezes/rales/rhonchi. Gastrointestinal:   Soft with  right lower quadrant pain, not at McBurney's point. Non distended. There is no CVA tenderness.  No rebound, rigidity, or guarding. Musculoskeletal:   Normal range of motion in all extremities. No joint effusions.  No lower extremity tenderness.  No edema. Neurologic:   Normal speech and language.  Motor grossly intact. No acute focal neurologic deficits are appreciated.  Skin:    Skin is warm, dry and intact. No rash noted.  No petechiae, purpura, or bullae.  ____________________________________________    LABS (pertinent positives/negatives) (all labs ordered are listed, but only abnormal results are displayed) Labs Reviewed  LIPASE, BLOOD - Abnormal; Notable for the following components:      Result Value   Lipase 62 (*)    All other components within normal limits  COMPREHENSIVE METABOLIC PANEL - Abnormal; Notable for the following components:   Sodium 133 (*)    Chloride 92 (*)    Glucose, Bld 173 (*)    BUN 64 (*)    Creatinine, Ser 4.13 (*)    Calcium 8.6 (*)    Albumin 2.7 (*)    AST 67 (*)    Alkaline Phosphatase 163 (*)    Total Bilirubin 2.1 (*)    GFR calc non Af Amer 10 (*)    GFR calc Af Amer 11 (*)    All other components within normal limits  CBC - Abnormal; Notable for the following components:   WBC 13.6 (*)    RDW 15.6 (*)    All other components within normal limits  AMMONIA  URINALYSIS, COMPLETE (UACMP) WITH  MICROSCOPIC   ____________________________________________   EKG  Interpreted by me Sinus tachycardia rate 111, normal axis intervals QRS ST segments and T waves  ____________________________________________    RADIOLOGY  Ct Abdomen Pelvis Wo Contrast  Result Date: 04/26/2018 CLINICAL DATA:  Vomiting and decreased appetite for several days. EXAM: CT ABDOMEN AND PELVIS WITHOUT CONTRAST TECHNIQUE: Multidetector CT imaging of the abdomen and pelvis was performed following the standard protocol without IV contrast. COMPARISON:  CT abdomen and pelvis 10/22/2004 FINDINGS: Lower chest: Heart size is normal. No pericardial effusion. Calcific aortic and coronary atherosclerosis is identified. There is some dependent atelectasis in the lung bases. Hepatobiliary: The liver is shrunken with a nodular border consistent with cirrhosis. There is recanalization of the umbilical vein and gastric and splenic variceal formation. No focal liver lesion is identified. There is some sludge and a single stone in the gallbladder. Pancreas: A few small calcifications are seen in the body of the pancreas consistent with remote pancreatitis. The pancreas is otherwise unremarkable. Spleen: Normal in size without focal abnormality. Adrenals/Urinary Tract: Small cyst off the lower pole of the right kidney is noted. The kidneys are otherwise unremarkable. Ureters and urinary bladder appear normal. The adrenal glands appear normal. Stomach/Bowel: Mild thickening of the walls of the ascending colon is likely related to cirrhosis. The colon otherwise appears normal. The patient has a small hiatal hernia. The stomach is otherwise unremarkable. Small bowel appears normal. No evidence of appendicitis. Vascular/Lymphatic: Aortic atherosclerosis. No enlarged abdominal or pelvic lymph nodes. Reproductive: Status post hysterectomy. No adnexal masses. Pessary device is in place. Other: Moderate to moderately large volume of ascites is  identified. Musculoskeletal: Negative. IMPRESSION: No acute abnormality. Cirrhotic liver and findings consistent with portal venous hypertension. Moderate to moderately large volume of abdominal and pelvic ascites is present. Small hiatal hernia. Gallbladder sludge and a single small stone without evidence of cholecystitis. Calcific aortic and coronary atherosclerosis. Electronically  Signed   By: Drusilla Kanner M.D.   On: 04/26/2018 15:54   Dg Chest 2 View  Result Date: 04/26/2018 CLINICAL DATA:  Cough, weakness, tachycardia EXAM: CHEST - 2 VIEW COMPARISON:  07/23/2017 chest radiograph. FINDINGS: Normal heart size. New smooth bulge at the right anterior costophrenic mediastinal contour. Otherwise stable normal mediastinal contour. No pneumothorax. No pleural effusion. Lungs appear clear, with no acute consolidative airspace disease and no pulmonary edema. IMPRESSION: 1. No active cardiopulmonary disease. 2. 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 Electronically Signed   By: Delbert Phenix M.D.   On: 04/26/2018 16:00    ____________________________________________   PROCEDURES Procedures  ____________________________________________  DIFFERENTIAL DIAGNOSIS   Appendicitis, diverticulitis, hepatic encephalopathy, dehydration, GERD  CLINICAL IMPRESSION / ASSESSMENT AND PLAN / ED COURSE  Pertinent labs & imaging results that were available during my care of the patient were reviewed by me and considered in my medical decision making (see chart for details).    Patient with significant comorbidities presents with right lower quadrant pain and vomiting.  I will obtain a CT scan to evaluate for appendicitis versus other intra-abdominal pathology.  IV fluids for hydration, Zofran for vomiting control.  She is not  septic.  Clinical Course as of Apr 27 1855  Sun Apr 26, 2018  1517 HR improving with IVF, down to 100 after 1 L ns bolus.    [PS]    Clinical Course User Index [PS] Sharman Cheek, MD     ----------------------------------------- 6:56 PM on 04/26/2018 -----------------------------------------  Results discussed with the patient and 3 family members at the bedside.  Offered admission for treatment of dehydration vomiting and tachycardia.  Patient states that she could eat if she goes home and wants to go home.  She declines hospitalization at this time has Zofran at home and will follow up with her doctor.  She has medical decision-making capacity, nontoxic-appearing and was actually able to sit herself upright and scoot herself to the edge of the bed with good effort and energy and does appear to be feeling much better with hydration.  I will discharge home, return precautions discussed.  ____________________________________________   FINAL CLINICAL IMPRESSION(S) / ED DIAGNOSES    Final diagnoses:  Cirrhosis of liver with ascites, unspecified hepatic cirrhosis type (HCC)  Non-intractable vomiting with nausea, unspecified vomiting type  Dehydration     ED Discharge Orders        Ordered    metoCLOPramide (REGLAN) 10 MG tablet  Every 6 hours PRN     04/26/18 1856    loperamide (IMODIUM A-D) 2 MG tablet  4 times daily PRN     04/26/18 1856      Portions of this note were generated with dragon dictation software. Dictation errors may occur despite best attempts at proofreading.    Sharman Cheek, MD 04/26/18 1859

## 2018-04-26 NOTE — ED Triage Notes (Addendum)
Pt arrived via POV with son with reports vomiting for several days and decreased appetite.  Denies any diarrhea. Pt did have minimal amount of vomiting in triage.   Pt has cirrhosis of the liver and renal failure. Has to come frequently for paracentesis. Pt is under hospice care.

## 2018-04-26 NOTE — ED Notes (Signed)
.   Pt is resting, Respirations even and unlabored, NAD. Stretcher lowest postion and locked. Call bell within reach. Denies any needs at this time RN will continue to monitor.    

## 2018-04-27 ENCOUNTER — Ambulatory Visit: Admission: RE | Admit: 2018-04-27 | Source: Ambulatory Visit

## 2018-04-30 ENCOUNTER — Ambulatory Visit
Admission: RE | Admit: 2018-04-30 | Discharge: 2018-04-30 | Disposition: A | Discharge status: Home / Self Care | Source: Ambulatory Visit | Attending: Nephrology | Admitting: Nephrology

## 2018-04-30 DIAGNOSIS — R188 Other ascites: Secondary | ICD-10-CM | POA: Diagnosis present

## 2018-05-01 ENCOUNTER — Telehealth: Payer: Self-pay | Admitting: Family

## 2018-05-01 ENCOUNTER — Encounter: Payer: Self-pay | Admitting: Internal Medicine

## 2018-05-01 DIAGNOSIS — R300 Dysuria: Secondary | ICD-10-CM

## 2018-05-01 NOTE — Telephone Encounter (Signed)
Order placed What symptoms?  Please bring specimen

## 2018-05-01 NOTE — Telephone Encounter (Signed)
Copied from CRM [id number]. Topic: General - Other >> May 01, 2018 12:51 PM Percival SpanishKennedy, Cheryl W wrote:   Mitzi with [facility] cal lto say pt is presenting symptons of a UTI  requesting antibiotic order  [phone]

## 2018-05-01 NOTE — Telephone Encounter (Signed)
Patient is more confused , urinary frequency and vaginal itching? Related to pessiary  Symptoms present for  3 days. No fever. Patient was unable to leave specimen when RN was there, patient has specimen container for collection . She will alert on call to go pick up specimen. Per [name] FNP, [facility] Diflucan 150 1 po once repeat in 72 hours if needed.

## 2018-05-01 NOTE — Telephone Encounter (Signed)
Noted  Suspect confusion complicated by disease process ( cirrhosis, ammonia)  Will await urine

## 2018-05-04 ENCOUNTER — Ambulatory Visit: Admission: RE | Admit: 2018-05-04 | Source: Ambulatory Visit

## 2018-05-05 ENCOUNTER — Telehealth: Payer: Self-pay

## 2018-05-05 NOTE — Telephone Encounter (Addendum)
Received copy of patient's urine culture report.  She does not appear to have a UTI.  Please contact the patient or hospice to see if she has had any additional symptoms or if she has had persistent symptoms.  If she has she will need to be evaluated.  She does appear to have some white blood cells and red blood cells in her urine and will need to have this followed up on by her PCP with her potentially seeing nephrology or urology for further evaluation if deemed appropriate. Please get her set up with [name] for follow-up of this some time in the next 2-3 weeks. Thanks.

## 2018-05-05 NOTE — Telephone Encounter (Signed)
Copied from CRM [id number]. Topic: Quick Communication - See Telephone Encounter >> May 04, 2018  4:19 PM Luanna Coleawoud, Jessica L wrote: CRM for notification. See Telephone encounter for: 05/04/18. Davanea from [facility] is calling to make sure we have received a fax that was sent over for labs results [phone]

## 2018-05-05 NOTE — Telephone Encounter (Signed)
Spoke with daughter Thayer OhmChris. Advised of below.   She states confusion is symptoms are gone,  Still has some urinary frequency no other symptoms  .  She has appointment to follow up with nephrology on Tuesday.  Appointment scheduled to follow up with [name] .

## 2018-05-06 ENCOUNTER — Ambulatory Visit
Admission: RE | Admit: 2018-05-06 | Discharge: 2018-05-06 | Disposition: A | Discharge status: Home / Self Care | Source: Ambulatory Visit | Attending: Nephrology | Admitting: Nephrology

## 2018-05-06 DIAGNOSIS — R188 Other ascites: Secondary | ICD-10-CM | POA: Diagnosis present

## 2018-05-06 MED ORDER — ALBUMIN HUMAN 25 % IV SOLN
INTRAVENOUS | Status: AC
  Filled 2018-05-06: qty 100

## 2018-05-06 MED ORDER — ALBUMIN HUMAN 25 % IV SOLN
25.0000 g | Freq: Once | INTRAVENOUS | Status: AC
  Administered 2018-05-06: 25 g via INTRAVENOUS

## 2018-05-06 NOTE — Telephone Encounter (Signed)
[facility]  and advised her of Dr Birdie SonsSonnenberg recommendations in regards to the urine culture.  I advised her that Dr Birdie SonsSonnenberg didn't see a UTI, however urine  showed WBC and RBC.  I  spoke with daughter Thayer OhmChris on 05/05/18 and she told me patients confusion had resolved and patient was having some urinary frequency this was normal  For her . She has finished diflucan. She is to follow up with nephology on Tuesday on 05/12/18.   Patient will then follow up with [name] in August.   She [facility] she is out of office. FYI

## 2018-05-06 NOTE — Procedures (Signed)
Pre Procedural Dx: Symptomatic Ascites Post Procedural Dx: Same  Successful US guided paracentesis yielding 4 L of serous ascitic fluid.  EBL: None Complications: None immediate  Jay Jerre Vandrunen, MD Pager #: [phone]   

## 2018-05-07 ENCOUNTER — Ambulatory Visit: Admission: RE | Admit: 2018-05-07 | Source: Ambulatory Visit

## 2018-05-07 ENCOUNTER — Ambulatory Visit

## 2018-05-11 ENCOUNTER — Ambulatory Visit
Admission: RE | Admit: 2018-05-11 | Discharge: 2018-05-11 | Disposition: A | Discharge status: Home / Self Care | Source: Ambulatory Visit | Attending: Nephrology | Admitting: Nephrology

## 2018-05-11 ENCOUNTER — Other Ambulatory Visit: Payer: Self-pay | Admitting: Nephrology

## 2018-05-11 DIAGNOSIS — R188 Other ascites: Secondary | ICD-10-CM

## 2018-05-11 MED ORDER — SODIUM CHLORIDE FLUSH 0.9 % IV SOLN
INTRAVENOUS | Status: AC
  Filled 2018-05-11: qty 10

## 2018-05-11 MED ORDER — FENTANYL CITRATE (PF) 100 MCG/2ML IJ SOLN
50.0000 ug | Freq: Once | INTRAMUSCULAR | Status: AC
  Administered 2018-05-11: 50 ug via INTRAVENOUS

## 2018-05-11 MED ORDER — FENTANYL CITRATE (PF) 100 MCG/2ML IJ SOLN
INTRAMUSCULAR | Status: AC
  Filled 2018-05-11: qty 2

## 2018-05-11 MED ORDER — ALBUMIN HUMAN 25 % IV SOLN
25.0000 g | Freq: Once | INTRAVENOUS | Status: AC
Start: 2018-05-11 — End: 2018-05-11
  Administered 2018-05-11: 25 g via INTRAVENOUS

## 2018-05-11 MED ORDER — ALBUMIN HUMAN 25 % IV SOLN
INTRAVENOUS | Status: AC
  Administered 2018-05-11: 25 g via INTRAVENOUS
  Filled 2018-05-11: qty 100

## 2018-05-11 MED ORDER — ONDANSETRON HCL 4 MG/2ML IJ SOLN
INTRAMUSCULAR | Status: AC
  Filled 2018-05-11: qty 2

## 2018-05-11 MED ORDER — SODIUM CHLORIDE 0.9 % IV SOLN: INTRAVENOUS | Status: DC

## 2018-05-11 MED ORDER — ONDANSETRON HCL 4 MG/2ML IJ SOLN
4.0000 mg | Freq: Once | INTRAMUSCULAR | Status: AC
  Administered 2018-05-11: 4 mg via INTRAVENOUS

## 2018-05-11 NOTE — Telephone Encounter (Signed)
Noted Sonnenberg's advice. Glad she is at her baseline now  Thank you. I will see patient in august

## 2018-05-11 NOTE — Procedures (Signed)
Pre Procedural Dx: Symptomatic Ascites Post Procedural Dx: Same  Successful US guided paracentesis yielding 4 L of serous ascitic fluid.  EBL: None Complications: None immediate  Susan Lilyahna Sirmon, MD Pager #: [phone]   

## 2018-05-14 ENCOUNTER — Ambulatory Visit

## 2018-05-17 ENCOUNTER — Encounter: Payer: Self-pay | Admitting: Emergency Medicine

## 2018-05-17 ENCOUNTER — Other Ambulatory Visit: Payer: Self-pay

## 2018-05-17 ENCOUNTER — Inpatient Hospital Stay
Admission: EM | Admit: 2018-05-17 | Discharge: 2018-05-20 | DRG: 377 | Disposition: A | Discharge status: Hospice | Attending: Specialist | Admitting: Specialist

## 2018-05-17 DIAGNOSIS — E871 Hypo-osmolality and hyponatremia: Secondary | ICD-10-CM | POA: Diagnosis present

## 2018-05-17 DIAGNOSIS — K920 Hematemesis: Secondary | ICD-10-CM | POA: Diagnosis present

## 2018-05-17 DIAGNOSIS — R945 Abnormal results of liver function studies: Secondary | ICD-10-CM | POA: Diagnosis present

## 2018-05-17 DIAGNOSIS — F419 Anxiety disorder, unspecified: Secondary | ICD-10-CM | POA: Diagnosis present

## 2018-05-17 DIAGNOSIS — Z681 Body mass index (BMI) 19 or less, adult: Secondary | ICD-10-CM

## 2018-05-17 DIAGNOSIS — K210 Gastro-esophageal reflux disease with esophagitis: Secondary | ICD-10-CM | POA: Diagnosis present

## 2018-05-17 DIAGNOSIS — E869 Volume depletion, unspecified: Secondary | ICD-10-CM | POA: Diagnosis present

## 2018-05-17 DIAGNOSIS — Z66 Do not resuscitate: Secondary | ICD-10-CM | POA: Diagnosis present

## 2018-05-17 DIAGNOSIS — N185 Chronic kidney disease, stage 5: Secondary | ICD-10-CM | POA: Diagnosis present

## 2018-05-17 DIAGNOSIS — R410 Disorientation, unspecified: Secondary | ICD-10-CM | POA: Diagnosis present

## 2018-05-17 DIAGNOSIS — K922 Gastrointestinal hemorrhage, unspecified: Secondary | ICD-10-CM | POA: Diagnosis not present

## 2018-05-17 DIAGNOSIS — D631 Anemia in chronic kidney disease: Secondary | ICD-10-CM | POA: Diagnosis present

## 2018-05-17 DIAGNOSIS — E872 Acidosis: Secondary | ICD-10-CM | POA: Diagnosis not present

## 2018-05-17 DIAGNOSIS — F329 Major depressive disorder, single episode, unspecified: Secondary | ICD-10-CM | POA: Diagnosis present

## 2018-05-17 DIAGNOSIS — Z515 Encounter for palliative care: Secondary | ICD-10-CM | POA: Diagnosis present

## 2018-05-17 DIAGNOSIS — E43 Unspecified severe protein-calorie malnutrition: Secondary | ICD-10-CM | POA: Diagnosis present

## 2018-05-17 DIAGNOSIS — D72828 Other elevated white blood cell count: Secondary | ICD-10-CM

## 2018-05-17 DIAGNOSIS — R112 Nausea with vomiting, unspecified: Secondary | ICD-10-CM

## 2018-05-17 DIAGNOSIS — N139 Obstructive and reflux uropathy, unspecified: Secondary | ICD-10-CM | POA: Diagnosis present

## 2018-05-17 DIAGNOSIS — G9340 Encephalopathy, unspecified: Secondary | ICD-10-CM

## 2018-05-17 DIAGNOSIS — Z79899 Other long term (current) drug therapy: Secondary | ICD-10-CM

## 2018-05-17 DIAGNOSIS — K7031 Alcoholic cirrhosis of liver with ascites: Secondary | ICD-10-CM | POA: Diagnosis present

## 2018-05-17 DIAGNOSIS — Z91048 Other nonmedicinal substance allergy status: Secondary | ICD-10-CM

## 2018-05-17 DIAGNOSIS — F1010 Alcohol abuse, uncomplicated: Secondary | ICD-10-CM | POA: Diagnosis present

## 2018-05-17 DIAGNOSIS — D72829 Elevated white blood cell count, unspecified: Secondary | ICD-10-CM | POA: Diagnosis present

## 2018-05-17 DIAGNOSIS — R195 Other fecal abnormalities: Secondary | ICD-10-CM | POA: Diagnosis present

## 2018-05-17 DIAGNOSIS — I120 Hypertensive chronic kidney disease with stage 5 chronic kidney disease or end stage renal disease: Secondary | ICD-10-CM | POA: Diagnosis present

## 2018-05-17 DIAGNOSIS — K7290 Hepatic failure, unspecified without coma: Secondary | ICD-10-CM | POA: Diagnosis present

## 2018-05-17 DIAGNOSIS — K760 Fatty (change of) liver, not elsewhere classified: Secondary | ICD-10-CM | POA: Diagnosis present

## 2018-05-17 DIAGNOSIS — R188 Other ascites: Secondary | ICD-10-CM | POA: Diagnosis not present

## 2018-05-17 DIAGNOSIS — Z7189 Other specified counseling: Secondary | ICD-10-CM

## 2018-05-17 DIAGNOSIS — N179 Acute kidney failure, unspecified: Secondary | ICD-10-CM | POA: Diagnosis present

## 2018-05-17 LAB — CBC WITH DIFFERENTIAL/PLATELET
Basophils Absolute: 0.1 10*3/uL (ref 0–0.1)
Basophils Relative: 0 %
Eosinophils Absolute: 0 10*3/uL (ref 0–0.7)
Eosinophils Relative: 0 %
HEMATOCRIT: 33.5 % — AB (ref 35.0–47.0)
HEMOGLOBIN: 11.7 g/dL — AB (ref 12.0–16.0)
LYMPHS ABS: 0.9 10*3/uL — AB (ref 1.0–3.6)
Lymphocytes Relative: 5 %
MCH: 32 pg (ref 26.0–34.0)
MCHC: 34.9 g/dL (ref 32.0–36.0)
MCV: 91.5 fL (ref 80.0–100.0)
Monocytes Absolute: 1.3 10*3/uL — ABNORMAL HIGH (ref 0.2–0.9)
Monocytes Relative: 7 %
NEUTROS ABS: 16.5 10*3/uL — AB (ref 1.4–6.5)
NEUTROS PCT: 88 %
Platelets: 200 10*3/uL (ref 150–440)
RBC: 3.66 MIL/uL — AB (ref 3.80–5.20)
RDW: 15.2 % — ABNORMAL HIGH (ref 11.5–14.5)
WBC: 18.7 10*3/uL — AB (ref 3.6–11.0)

## 2018-05-17 LAB — LIPASE, BLOOD: Lipase: 40 U/L (ref 11–51)

## 2018-05-17 LAB — COMPREHENSIVE METABOLIC PANEL
ALK PHOS: 125 U/L (ref 38–126)
ALT: 25 U/L (ref 0–44)
ANION GAP: 15 (ref 5–15)
AST: 55 U/L — ABNORMAL HIGH (ref 15–41)
Albumin: 3.1 g/dL — ABNORMAL LOW (ref 3.5–5.0)
BUN: 76 mg/dL — ABNORMAL HIGH (ref 8–23)
CHLORIDE: 91 mmol/L — AB (ref 98–111)
CO2: 22 mmol/L (ref 22–32)
Calcium: 9 mg/dL (ref 8.9–10.3)
Creatinine, Ser: 5.19 mg/dL — ABNORMAL HIGH (ref 0.44–1.00)
GFR calc non Af Amer: 7 mL/min — ABNORMAL LOW (ref >60)
GFR, EST AFRICAN AMERICAN: 8 mL/min — AB (ref >60)
GLUCOSE: 139 mg/dL — AB (ref 70–99)
POTASSIUM: 5 mmol/L (ref 3.5–5.1)
Sodium: 128 mmol/L — ABNORMAL LOW (ref 135–145)
Total Bilirubin: 2.8 mg/dL — ABNORMAL HIGH (ref 0.3–1.2)
Total Protein: 6.8 g/dL (ref 6.5–8.1)

## 2018-05-17 LAB — TYPE AND SCREEN
ABO/RH(D): O POS
Antibody Screen: NEGATIVE

## 2018-05-17 LAB — HEMOGLOBIN
HEMOGLOBIN: 11.3 g/dL — AB (ref 12.0–16.0)
Hemoglobin: 10.6 g/dL — ABNORMAL LOW (ref 12.0–16.0)

## 2018-05-17 LAB — PROTIME-INR
INR: 1.15
Prothrombin Time: 14.6 seconds (ref 11.4–15.2)

## 2018-05-17 LAB — TROPONIN I: Troponin I: <0.03 ng/mL (ref <0.03)

## 2018-05-17 MED ORDER — MORPHINE SULFATE (CONCENTRATE) 10 MG/0.5ML PO SOLN: 5.0000 mg | ORAL | Status: DC | PRN

## 2018-05-17 MED ORDER — SODIUM BICARBONATE 650 MG PO TABS
650.0000 mg | ORAL_TABLET | Freq: Two times a day (BID) | ORAL | Status: DC
  Administered 2018-05-17 – 2018-05-20 (×4): 650 mg via ORAL
  Filled 2018-05-17 (×4): qty 1

## 2018-05-17 MED ORDER — LORAZEPAM 0.5 MG PO TABS
0.5000 mg | ORAL_TABLET | Freq: Two times a day (BID) | ORAL | Status: DC | PRN
  Administered 2018-05-17 – 2018-05-20 (×2): 0.5 mg via ORAL
  Filled 2018-05-17 (×2): qty 1

## 2018-05-17 MED ORDER — BISACODYL 5 MG PO TBEC: 5.0000 mg | DELAYED_RELEASE_TABLET | Freq: Every day | ORAL | Status: DC | PRN

## 2018-05-17 MED ORDER — ACETAMINOPHEN 650 MG RE SUPP: 650.0000 mg | Freq: Four times a day (QID) | RECTAL | Status: DC | PRN

## 2018-05-17 MED ORDER — POLYVINYL ALCOHOL 1.4 % OP SOLN
1.0000 [drp] | OPHTHALMIC | Status: DC | PRN
  Administered 2018-05-19 (×2): 1 [drp] via OPHTHALMIC
  Filled 2018-05-17 (×2): qty 15

## 2018-05-17 MED ORDER — POLYETHYLENE GLYCOL 3350 17 G PO PACK: 17.0000 g | PACK | Freq: Every day | ORAL | Status: DC | PRN

## 2018-05-17 MED ORDER — ONDANSETRON HCL 4 MG/2ML IJ SOLN: 4.0000 mg | Freq: Four times a day (QID) | INTRAMUSCULAR | Status: DC | PRN

## 2018-05-17 MED ORDER — ALBUTEROL SULFATE (2.5 MG/3ML) 0.083% IN NEBU: 2.5000 mg | INHALATION_SOLUTION | RESPIRATORY_TRACT | Status: DC | PRN

## 2018-05-17 MED ORDER — OCTREOTIDE LOAD VIA INFUSION
50.0000 ug | Freq: Once | INTRAVENOUS | Status: AC
  Administered 2018-05-18: 50 ug via INTRAVENOUS
  Filled 2018-05-17: qty 25

## 2018-05-17 MED ORDER — WITCH HAZEL-GLYCERIN EX PADS
MEDICATED_PAD | CUTANEOUS | Status: DC | PRN
  Administered 2018-05-17: 22:00:00 via TOPICAL
  Filled 2018-05-17: qty 100

## 2018-05-17 MED ORDER — SODIUM CHLORIDE 0.9 % IV SOLN
80.0000 mg | Freq: Once | INTRAVENOUS | Status: AC
  Administered 2018-05-17: 80 mg via INTRAVENOUS
  Filled 2018-05-17: qty 80

## 2018-05-17 MED ORDER — HYDROCODONE-ACETAMINOPHEN 5-325 MG PO TABS
1.0000 | ORAL_TABLET | ORAL | Status: DC | PRN
  Administered 2018-05-17 – 2018-05-19 (×2): 1 via ORAL
  Filled 2018-05-17: qty 1
  Filled 2018-05-17: qty 2

## 2018-05-17 MED ORDER — SENNOSIDES-DOCUSATE SODIUM 8.6-50 MG PO TABS: 1.0000 | ORAL_TABLET | Freq: Every evening | ORAL | Status: DC | PRN

## 2018-05-17 MED ORDER — RIFAXIMIN 550 MG PO TABS
550.0000 mg | ORAL_TABLET | Freq: Two times a day (BID) | ORAL | Status: DC
  Administered 2018-05-17 – 2018-05-20 (×6): 550 mg via ORAL
  Filled 2018-05-17 (×7): qty 1

## 2018-05-17 MED ORDER — SODIUM CHLORIDE 0.9 % IV SOLN: INTRAVENOUS | Status: DC

## 2018-05-17 MED ORDER — ACETAMINOPHEN 325 MG PO TABS: 650.0000 mg | ORAL_TABLET | Freq: Four times a day (QID) | ORAL | Status: DC | PRN

## 2018-05-17 MED ORDER — SODIUM CHLORIDE 0.9 % IV SOLN
50.0000 ug/h | INTRAVENOUS | Status: DC
  Administered 2018-05-18 – 2018-05-19 (×4): 50 ug/h via INTRAVENOUS
  Filled 2018-05-17 (×4): qty 1

## 2018-05-17 MED ORDER — LOPERAMIDE HCL 2 MG PO CAPS: 4.0000 mg | ORAL_CAPSULE | Freq: Four times a day (QID) | ORAL | Status: DC | PRN

## 2018-05-17 MED ORDER — ONDANSETRON HCL 4 MG PO TABS: 4.0000 mg | ORAL_TABLET | Freq: Four times a day (QID) | ORAL | Status: DC | PRN

## 2018-05-17 MED ORDER — SODIUM CHLORIDE 0.9 % IV SOLN
8.0000 mg/h | INTRAVENOUS | Status: DC
  Administered 2018-05-17 – 2018-05-19 (×5): 8 mg/h via INTRAVENOUS
  Filled 2018-05-17 (×5): qty 80

## 2018-05-17 NOTE — ED Provider Notes (Signed)
[facility] Emergency Department Provider Note   ____________________________________________   First MD Initiated Contact with Patient 05/17/18 1118     (approximate)  I have reviewed the triage vital signs and the nursing notes.   HISTORY  Chief Complaint Emesis    HPI Susan Greer is a 77 y.o. female who have family reports she has had nausea vomiting for 2 weeks yesterday it looked like coffee-ground emesis and collard greens.  Patient is weak and lightheaded and her heart racing.  She reports she has black tarry stools.  She really does not have any abdominal pain   Past Medical History:  Diagnosis Date  . Cellulitis of left leg   . Chronic kidney disease (CKD), stage V (HCC)   . Cirrhosis of liver (HCC)   . GERD (gastroesophageal reflux disease)   . History of kidney stones   . Hypertension   . Kidney stones   . SVT (supraventricular tachycardia) (HCC)    Dr. Lady Gary at [place], s/p adenosine  . UTI (urinary tract infection)     Patient Active Problem List   Diagnosis Date Noted  . CKD (chronic kidney disease) stage 5, GFR less than 15 ml/min (HCC) 01/23/2018  . Partial rectal prolapse 12/04/2017  . Hospice care 12/04/2017  . Hemorrhoids 12/01/2017  . Acute renal failure (HCC)   . Palliative care by specialist   . Ascites   . AKI (acute kidney injury) (HCC) 11/17/2017  . Cellulitis 07/29/2017  . Diarrhea 07/08/2017  . History of shingles 06/03/2016  . Cirrhosis of liver (HCC) 06/03/2016  . Elevated LFTs 01/11/2016  . Macular degeneration, left eye 01/11/2016  . Vaginal prolapse 01/11/2016  . Goals of care, counseling/discussion 02/23/2014  . Depression 02/23/2014  . Anxiety state 02/23/2014  . Essential hypertension, benign 04/12/2013  . Female bladder prolapse 04/12/2013    Past Surgical History:  Procedure Laterality Date  . ABDOMINAL HYSTERECTOMY     Dr. Weston Anna, for fibroid tumor and endometriosis  .  ESOPHAGOGASTRODUODENOSCOPY (EGD) WITH PROPOFOL N/A 01/27/2017   Procedure: ESOPHAGOGASTRODUODENOSCOPY (EGD) WITH PROPOFOL;  Surgeon: Scot Jun, MD;  Location[facility] ENDOSCOPY;  Service: Endoscopy;  Laterality: N/A;    Prior to Admission medications   Medication Sig Start Date End Date Taking? Authorizing Provider  cholestyramine (QUESTRAN) 4 g packet Take 1 packet (4 g total) by mouth daily. Patient not taking: Reported on 03/27/2018 02/13/18   Allegra Grana, FNP  conjugated estrogens (PREMARIN) vaginal cream Place 1 Applicatorful vaginally daily. 11/25/17   Gouru, Deanna Artis, MD  docusate sodium (COLACE) 100 MG capsule TAKE 1 CAPSULE BY MOUTH TWICE DAILY AS NEEDED CONTSIPATION 02/18/18   Sherlene Shams, MD  loperamide (IMODIUM A-D) 2 MG tablet Take 2 tablets (4 mg total) by mouth 4 (four) times daily as needed for diarrhea or loose stools. 04/26/18   Sharman Cheek, MD  LORazepam (ATIVAN) 0.5 MG tablet TAKE 1 TABLET BY MOUTH TWICE DAILY AS NEEDED ANXIETY 03/27/18   Allegra Grana, FNP  metoCLOPramide (REGLAN) 10 MG tablet Take 1 tablet (10 mg total) by mouth every 6 (six) hours as needed. 04/26/18   Sharman Cheek, MD  Morphine Sulfate (MORPHINE CONCENTRATE) 10 mg / 0.5 ml concentrated solution Take 0.25 mLs (5 mg total) by mouth every 2 (two) hours as needed for severe pain. 03/30/18   Allegra Grana, FNP  ondansetron (ZOFRAN) 4 MG tablet TAKE 1 TABLET BY MOUTH EVERY 8 HOURS AS NEEDED NAUSEA AND VOMITING 04/20/18  Allegra Grana, FNP  polyethylene glycol (MIRALAX / GLYCOLAX) packet Take 17 g by mouth daily as needed for mild constipation. 11/24/17   Ramonita Lab, MD  polyvinyl alcohol (LIQUIFILM TEARS) 1.4 % ophthalmic solution Place 1 drop into both eyes as needed for dry eyes. 11/24/17   Gouru, Deanna Artis, MD  rifaximin (XIFAXAN) 550 MG TABS tablet Take 550 mg by mouth 2 (two) times daily.  10/08/17   [provider]  sodium bicarbonate 650 MG tablet Take 650 mg by mouth 2 (two) times  daily.    [provider]  terconazole (TERAZOL 3) 0.8 % vaginal cream Place 20 applicators vaginally once a week. 12/22/17   Natale Milch, MD  witch hazel-glycerin (TUCKS) pad Apply topically as needed for itching. 11/24/17   Ramonita Lab, MD    Allergies Tape  Family History  Problem Relation Age of Onset  . Hypertension Mother   . Cancer Mother 14       breast  . Hypertension Father   . Pneumonia Father   . Diabetes Brother   . Hypertension Brother   . Cancer Sister 49       breast    Social History Social History   Tobacco Use  . Smoking status: Never Smoker  . Smokeless tobacco: Never Used  Substance Use Topics  . Alcohol use: No  . Drug use: No    Review of Systems  Constitutional: No fever/chills Eyes: No visual changes. ENT: No sore throat. Cardiovascular: Denies chest pain. Respiratory: Denies shortness of breath. Gastrointestinal: No abdominal pain.   nausea,  vomiting.  No diarrhea.  No constipation. Genitourinary: Negative for dysuria. Musculoskeletal: Negative for back pain. Skin: Negative for rash. Neurological: Negative for headaches, focal weakness   ____________________________________________   PHYSICAL EXAM:  VITAL SIGNS: ED Triage Vitals  Enc Vitals Group     BP 05/17/18 1043 138/74     Pulse Rate 05/17/18 1043 (!) 115     Resp 05/17/18 1043 17     Temp 05/17/18 1043 98.3 F (36.8 C)     Temp Source 05/17/18 1043 Oral     SpO2 05/17/18 1043 96 %     Weight 05/17/18 1043 93 lb (42.2 kg)     Height 05/17/18 1043 5\' 3"  (1.6 m)     Head Circumference --      Peak Flow --      Pain Score 05/17/18 1055 10     Pain Loc --      Pain Edu? --      Excl. in GC? --     Constitutional: Alert and oriented.  Chronically ill appearing but in no acute distress. Eyes: Conjunctivae are normal.  Head: Atraumatic. Nose: No congestion/rhinnorhea. Mouth/Throat: Mucous membranes are moist.  Oropharynx non-erythematous. Neck: No  stridor.   Cardiovascular: Rapid rate, regular rhythm. Grossly normal heart sounds.  Good peripheral circulation. Respiratory: Normal respiratory effort.  No retractions. Lungs CTAB. Gastrointestinal: Soft and nontender. No distention. No abdominal bruits. No CVA tenderness. Rectal: Family reports rectal prolapse.  Patient does have large hemorrhoids and some exposed and rectal mucosa.  Patient also has anterior vaginal prolapse.  There is some bloody stool in the rectum.  Musculoskeletal: No lower extremity tenderness there is edema left somewhat greater than right.  Neurologic:  Normal speech and language. No gross focal neurologic deficits are appreciated. No gait instability. Skin:  Skin is warm, dry and intact. No rash noted. Psychiatric: Mood and affect are normal. Speech and  behavior are normal.  ____________________________________________   LABS (all labs ordered are listed, but only abnormal results are displayed)  Labs Reviewed  COMPREHENSIVE METABOLIC PANEL - Abnormal; Notable for the following components:      Result Value   Sodium 128 (*)    Chloride 91 (*)    Glucose, Bld 139 (*)    BUN 76 (*)    Creatinine, Ser 5.19 (*)    Albumin 3.1 (*)    AST 55 (*)    Total Bilirubin 2.8 (*)    GFR calc non Af Amer 7 (*)    GFR calc Af Amer 8 (*)    All other components within normal limits  CBC WITH DIFFERENTIAL/PLATELET - Abnormal; Notable for the following components:   WBC 18.7 (*)    RBC 3.66 (*)    Hemoglobin 11.7 (*)    HCT 33.5 (*)    RDW 15.2 (*)    Neutro Abs 16.5 (*)    Lymphs Abs 0.9 (*)    Monocytes Absolute 1.3 (*)    All other components within normal limits  LIPASE, BLOOD  PROTIME-INR  TROPONIN I  TYPE AND SCREEN   ____________________________________________  EKG   ____________________________________________  RADIOLOGY  ED MD interpretation:    Official radiology report(s): No results  found.  ____________________________________________   PROCEDURES  Procedure(s) performed:   Procedures  Critical Care performed:   ____________________________________________   INITIAL IMPRESSION / ASSESSMENT AND PLAN / ED COURSE  With history of hematemesis or coffee-ground emesis feeling weak and dizzy tachycardia also has an elevated white count cannot determine why and hyponatremia.  Her abdomen is nontender.  She is not coughing she has no focal findings anywhere except for the hematemesis by history.  We will give her some IV fluids watch her hematocrit and some proton pump inhibitors.  Get her in the hospital.        ____________________________________________   FINAL CLINICAL IMPRESSION(S) / ED DIAGNOSES  Final diagnoses:  Non-intractable vomiting with nausea, unspecified vomiting type  Hematemesis with nausea  Hyponatremia  Other elevated white blood cell (WBC) count     ED Discharge Orders    None       Note:  This document was prepared using Dragon voice recognition software and may include unintentional dictation errors.    Arnaldo NatalMalinda, Paul F, MD 05/17/18 1302

## 2018-05-17 NOTE — ED Triage Notes (Signed)
Patient presents to the ED with nausea and vomiting x 2 weeks.  Per family, emesis yesterday looked like coffee grounds and collard greens.  Family states patient has been taking haldol, lorazepam and zofran for her symptoms but it does not seem to be improving.  Patient gets paracentesis twice a week.  Patient had it performed on Monday.  They are trying to only do paracentesis only once a week.  Patient is in hospice care for cirrhosis and kidney failure.

## 2018-05-17 NOTE — H&P (Addendum)
[facility] -  at [facility]   PATIENT NAME: Susan Greer    MR#:  [id number]  DATE OF BIRTH:  30-Aug-1941  DATE OF ADMISSION:  05/17/2018  PRIMARY CARE PHYSICIAN: Allegra GranaArnett, Margaret G, FNP   REQUESTING/REFERRING PHYSICIAN: Dr. Juliette AlcideMelinda.  CHIEF COMPLAINT:   Chief Complaint  Patient presents with  . Emesis   Coffee-ground vomiting since last night. HISTORY OF PRESENT ILLNESS:  Susan Greer  is a 77 y.o. female with a known history of liver cirrhosis, CKD stage V, GERD, hypertension, UTI and SVT.  The patient was sent from home to ED due to coffee-ground vomiting since yesterday.  Patient is confused and not a good historian.  Per the patient's son and daughter-in-law, the patient has had a nausea and vomiting for 2 weeks.  She has had paracentesis every week due to liver cirrhosis.  She was noticed to have coffee-ground vomiting since yesterday.  The patient was found hyponatremia, worsening renal failure and leukocytosis in the ED.  Protonix is started.  PAST MEDICAL HISTORY:   Past Medical History:  Diagnosis Date  . Cellulitis of left leg   . Chronic kidney disease (CKD), stage V (HCC)   . Cirrhosis of liver (HCC)   . GERD (gastroesophageal reflux disease)   . History of kidney stones   . Hypertension   . Kidney stones   . SVT (supraventricular tachycardia) (HCC)    [facility] [place]ath at [facility], s/p adenosine  . UTI (urinary tract infection)     PAST SURGICAL HISTORY:   Past Surgical History:  Procedure Laterality Date  . ABDOMINAL HYSTERECTOMY     Dr. Weston [place], for fibroid tumor and endometriosis  . ESOPHAGOGASTRODUODENOSCOPY (EGD) WITH PROPOFOL N/A 01/27/2017   Procedure: ESOPHAGOGASTRODUODENOSCOPY (EGD) WITH PROPOFOL;  Surgeon: Scot Junobert T Elliott, MD;  [facility] ENDOSCOPY;  Service: Endoscopy;  Laterality: N/A;    SOCIAL HISTORY:   Social History   Tobacco Use  . Smoking status: Never Smoker  . Smokeless tobacco: Never Used  Substance Use  Topics  . Alcohol use: No    FAMILY HISTORY:   Family History  Problem Relation Age of Onset  . Hypertension Mother   . Cancer Mother [id number]       breast  . Hypertension Father   . Pneumonia Father   . Diabetes Brother   . Hypertension Brother   . Cancer Sister 30       breast    DRUG ALLERGIES:   Allergies  Allergen Reactions  . Tape Rash    Adhesive tape    REVIEW OF SYSTEMS:   Review of Systems  Unable to perform ROS: Mental status change    MEDICATIONS AT HOME:   Prior to Admission medications   Medication Sig Start Date End Date Taking? Authorizing Provider  docusate sodium (COLACE) 100 MG capsule TAKE 1 CAPSULE BY MOUTH TWICE DAILY AS NEEDED CONTSIPATION 02/18/18  Yes Sherlene Shamsullo, Teresa L, MD  haloperidol (HALDOL) 2 MG/ML solution Take 1 mg by mouth as directed.   Yes [provider]  loperamide (IMODIUM A-D) 2 MG tablet Take 2 tablets (4 mg total) by mouth 4 (four) times daily as needed for diarrhea or loose stools. 04/26/18  Yes Sharman CheekStafford, Phillip, MD  LORazepam (ATIVAN) 0.5 MG tablet TAKE 1 TABLET BY MOUTH TWICE DAILY AS NEEDED ANXIETY 03/27/18  Yes Arnett, Lyn RecordsMargaret G, FNP  Morphine Sulfate (MORPHINE CONCENTRATE) 10 mg / 0.5 ml concentrated solution Take 0.25 mLs (5 mg total) by mouth every 2 (  two) hours as needed for severe pain. 03/30/18  Yes Allegra Grana, FNP  ondansetron (ZOFRAN) 4 MG tablet TAKE 1 TABLET BY MOUTH EVERY 8 HOURS AS NEEDED NAUSEA AND VOMITING 04/20/18  Yes Arnett, Lyn Records, FNP  polyethylene glycol (MIRALAX / GLYCOLAX) packet Take 17 g by mouth daily as needed for mild constipation. 11/24/17  Yes Gouru, Deanna Artis, MD  polyvinyl alcohol (LIQUIFILM TEARS) 1.4 % ophthalmic solution Place 1 drop into both eyes as needed for dry eyes. 11/24/17  Yes Gouru, Deanna Artis, MD  rifaximin (XIFAXAN) 550 MG TABS tablet Take 550 mg by mouth 2 (two) times daily.  10/08/17  Yes [provider]  sodium bicarbonate 650 MG tablet Take 650 mg by mouth 2 (two) times  daily.   Yes [provider]  witch hazel-glycerin (TUCKS) pad Apply topically as needed for itching. 11/24/17  Yes Gouru, Deanna Artis, MD  cholestyramine (QUESTRAN) 4 g packet Take 1 packet (4 g total) by mouth daily. Patient not taking: Reported on 03/27/2018 02/13/18   Allegra Grana, FNP  conjugated estrogens (PREMARIN) vaginal cream Place 1 Applicatorful vaginally daily. Patient not taking: Reported on 05/17/2018 11/25/17   Ramonita Lab, MD  metoCLOPramide (REGLAN) 10 MG tablet Take 1 tablet (10 mg total) by mouth every 6 (six) hours as needed. Patient not taking: Reported on 05/17/2018 04/26/18   Sharman Cheek, MD  terconazole (TERAZOL 3) 0.8 % vaginal cream Place 20 applicators vaginally once a week. Patient not taking: Reported on 05/17/2018 12/22/17   Natale Milch, MD      VITAL SIGNS:  Blood pressure 94/69, pulse (!) 109, temperature 98.3 F (36.8 C), temperature source Oral, resp. rate 13, height 5\' 3"  (1.6 m), weight 93 lb (42.2 kg), SpO2 97 %.  PHYSICAL EXAMINATION:  Physical Exam  GENERAL:  77 y.o.-year-old patient patient lying in the bed with no acute distress.  Severe malnutrition. EYES: Pupils equal, round, reactive to light and accommodation. No scleral icterus. Extraocular muscles intact.  HEENT: Head atraumatic, normocephalic. Oropharynx and nasopharynx clear.  NECK:  Supple, no jugular venous distention. No thyroid enlargement, no tenderness.  LUNGS: Normal breath sounds bilaterally, no wheezing, rales,rhonchi or crepitation. No use of accessory muscles of respiration.  CARDIOVASCULAR: S1, S2 normal. No murmurs, rubs, or gallops.  ABDOMEN: Soft, nontender, mildly distended. Bowel sounds present. No organomegaly or mass.  Positive ascites sign. EXTREMITIES: Bilateral leg edema. No cyanosis, or clubbing.  NEUROLOGIC: Cranial nerves II through XII are intact. Muscle strength 4/5 in all extremities. Sensation intact. Gait not checked.  PSYCHIATRIC: The patient is  confused, AAO x2.  SKIN: No obvious rash, lesion, or ulcer.   LABORATORY PANEL:   CBC Recent Labs  Lab 05/17/18 1132  WBC 18.7*  HGB 11.7*  HCT 33.5*  PLT 200   ------------------------------------------------------------------------------------------------------------------  Chemistries  Recent Labs  Lab 05/17/18 1132  NA 128*  K 5.0  CL 91*  CO2 22  GLUCOSE 139*  BUN 76*  CREATININE 5.19*  CALCIUM 9.0  AST 55*  ALT 25  ALKPHOS 125  BILITOT 2.8*   ------------------------------------------------------------------------------------------------------------------  Cardiac Enzymes Recent Labs  Lab 05/17/18 1132  TROPONINI <0.03   ------------------------------------------------------------------------------------------------------------------  RADIOLOGY:  No results found.    IMPRESSION AND PLAN:   GI bleeding, coffee-ground vomiting. The patient will be admitted to medical floor. Continue Protonix IV, follow-up hemoglobin every 6 hours, clear liquid for now and n.p.o. after midnight for possible EGD.  GI consult.  Hyponatremia.  Normal saline IV and follow-up BMP.  Acute renal failure on CKD stage 5. Start normal saline IV and follow-up BMP.  Nephrology consult.  Leukocytosis.  Possible due to UTI.  Check urinalysis and follow-up CBC.   Abnormal liver function tests, possible due to liver cirrhosis.  Follow-up GI consult and LFT.  Ascites.  Ultrasound-guided paracentesis on Tuesday.  All the records are reviewed and case discussed with ED provider. Management plans discussed with the patient, her son and daughter-in-law and they are in agreement.  CODE STATUS: DNR.  TOTAL TIME TAKING CARE OF THIS PATIENT:  50 minutes.    Shaune Pollack M.D on 05/17/2018 at 2:06 PM  Between 7am to 6pm - Pager - [phone]  After 6pm go to [email or web address] - Social research officer, government  [facility]  Office  [phone]  CC: Primary care  physician; Allegra Grana, FNP   Note: This dictation was prepared with Dragon dictation along with smaller phrase technology. Any transcriptional errors that result from this process are unin

## 2018-05-18 ENCOUNTER — Inpatient Hospital Stay

## 2018-05-18 ENCOUNTER — Ambulatory Visit: Admission: RE | Admit: 2018-05-18 | Source: Ambulatory Visit

## 2018-05-18 DIAGNOSIS — K7290 Hepatic failure, unspecified without coma: Secondary | ICD-10-CM

## 2018-05-18 DIAGNOSIS — K922 Gastrointestinal hemorrhage, unspecified: Secondary | ICD-10-CM

## 2018-05-18 DIAGNOSIS — R188 Other ascites: Secondary | ICD-10-CM

## 2018-05-18 LAB — BASIC METABOLIC PANEL
Anion gap: 12 (ref 5–15)
BUN: 84 mg/dL — AB (ref 8–23)
CHLORIDE: 92 mmol/L — AB (ref 98–111)
CO2: 23 mmol/L (ref 22–32)
CREATININE: 5.84 mg/dL — AB (ref 0.44–1.00)
Calcium: 8.3 mg/dL — ABNORMAL LOW (ref 8.9–10.3)
GFR calc Af Amer: 7 mL/min — ABNORMAL LOW (ref >60)
GFR, EST NON AFRICAN AMERICAN: 6 mL/min — AB (ref >60)
GLUCOSE: 101 mg/dL — AB (ref 70–99)
POTASSIUM: 4.9 mmol/L (ref 3.5–5.1)
SODIUM: 127 mmol/L — AB (ref 135–145)

## 2018-05-18 LAB — URINALYSIS, COMPLETE (UACMP) WITH MICROSCOPIC
BACTERIA UA: NONE SEEN
BILIRUBIN URINE: NEGATIVE
GLUCOSE, UA: NEGATIVE mg/dL
HGB URINE DIPSTICK: NEGATIVE
Ketones, ur: NEGATIVE mg/dL
LEUKOCYTES UA: NEGATIVE
NITRITE: NEGATIVE
PROTEIN: NEGATIVE mg/dL
Specific Gravity, Urine: 1.013 (ref 1.005–1.030)
pH: 5 (ref 5.0–8.0)

## 2018-05-18 LAB — ALBUMIN, PLEURAL OR PERITONEAL FLUID: Albumin, Fluid: <1 g/dL

## 2018-05-18 LAB — CBC
HEMATOCRIT: 29.8 % — AB (ref 35.0–47.0)
Hemoglobin: 10.5 g/dL — ABNORMAL LOW (ref 12.0–16.0)
MCH: 32.3 pg (ref 26.0–34.0)
MCHC: 35.2 g/dL (ref 32.0–36.0)
MCV: 91.8 fL (ref 80.0–100.0)
PLATELETS: 155 10*3/uL (ref 150–440)
RBC: 3.25 MIL/uL — ABNORMAL LOW (ref 3.80–5.20)
RDW: 15.1 % — AB (ref 11.5–14.5)
WBC: 14.1 10*3/uL — AB (ref 3.6–11.0)

## 2018-05-18 LAB — BODY FLUID CELL COUNT WITH DIFFERENTIAL
EOS FL: 0 %
Lymphs, Fluid: 51 %
MONOCYTE-MACROPHAGE-SEROUS FLUID: 25 %
Neutrophil Count, Fluid: 24 %
Total Nucleated Cell Count, Fluid: 325 cu mm

## 2018-05-18 LAB — GLUCOSE, PLEURAL OR PERITONEAL FLUID: Glucose, Fluid: 110 mg/dL

## 2018-05-18 LAB — ACETAMINOPHEN LEVEL: Acetaminophen (Tylenol), Serum: 12 ug/mL (ref 10–30)

## 2018-05-18 MED ORDER — LACTULOSE 10 GM/15ML PO SOLN
30.0000 g | Freq: Two times a day (BID) | ORAL | Status: DC | PRN
  Administered 2018-05-18: 30 g via ORAL
  Filled 2018-05-18: qty 60

## 2018-05-18 MED ORDER — ALBUMIN HUMAN 25 % IV SOLN
25.0000 g | Freq: Once | INTRAVENOUS | Status: AC
  Administered 2018-05-18: 25 g via INTRAVENOUS
  Filled 2018-05-18: qty 100

## 2018-05-18 MED ORDER — ALUM & MAG HYDROXIDE-SIMETH 200-200-20 MG/5ML PO SUSP: 30.0000 mL | Freq: Four times a day (QID) | ORAL | Status: DC | PRN

## 2018-05-18 MED ORDER — SODIUM CHLORIDE 0.9 % IV SOLN
INTRAVENOUS | Status: DC
  Administered 2018-05-18 – 2018-05-20 (×4): via INTRAVENOUS

## 2018-05-18 MED ORDER — ALBUMIN HUMAN 25 % IV SOLN
25.0000 g | Freq: Once | INTRAVENOUS | Status: AC
  Administered 2018-05-18: 25 g via INTRAVENOUS
  Filled 2018-05-18 (×3): qty 100

## 2018-05-18 NOTE — Consult Note (Addendum)
[name] , MD [address], [address], [place], [place], [address] [address], [place], [place], [address] Phone: [phone]  Fax: [phone]  Consultation  Referring Provider:  Dr Imogene Burn  Primary Care Physician:  Allegra Grana, FNP Primary Gastroenterologist:  Dr. Markham Jordan          Reason for Consultation:     GI bleed  Date of Admission:  05/17/2018 Date of Consultation:  05/18/2018         HPI:   Susan Greer is a 77 y.o. female who is a patient of Dr. Mechele Collin at [facility] was last seen in the office in October 2018.  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  History of portal gastropathy as per endoscopy in April 2018.  Has been on Lasix and Aldactone as an outpatient.  As per the last office visit there was some discussion about obtaining a liver biopsy to determine the etiology of the liver cirrhosis.  This admission the patient has been admitted with coffee-ground emesis.  Underwent an ultrasound-guided abdominal paracentesis on 05/11/2018 and 4 L of ascites was aspirated.  No labs seems to be ordered on the acetic fluid.  On admission she is found to have a hemoglobin 11.7 g with a white cell count of 18.7, platelet count of 200.  Later today the hemoglobin is 10.5 g.  I have a INR of 1.15.  On admission she was in acute renal failure with a creatinine of 5.19 with a low chloride of 91 and a low sodium of 128.  I am unsure if she received any volume expanders after her paracentesis.  Also found to have a low sodium of 128.  Patient appears to be slightly confused and denies any cofee ground emesis, states her stool is "clear" and wants to go home. Denies any abdominal pain.   Past Medical History:  Diagnosis Date  . Cellulitis of left leg   . Chronic kidney disease (CKD), stage V (HCC)   . Cirrhosis of liver (HCC)   . Cirrhosis of liver (HCC)   . GERD (gastroesophageal reflux disease)   . History of kidney stones   .  Hypertension   . Kidney stones   . SVT (supraventricular tachycardia) (HCC)    Dr. Lady Gary at [facility], s/p adenosine  . UTI (urinary tract infection)     Past Surgical History:  Procedure Laterality Date  . ABDOMINAL HYSTERECTOMY     Dr. Weston Oriyah Lamphear, for fibroid tumor and endometriosis  . ESOPHAGOGASTRODUODENOSCOPY (EGD) WITH PROPOFOL N/A 01/27/2017   Procedure: ESOPHAGOGASTRODUODENOSCOPY (EGD) WITH PROPOFOL;  Surgeon: Scot Jun, MD;  Location: [facility] ENDOSCOPY;  Service: Endoscopy;  Laterality: N/A;    Prior to Admission medications   Medication Sig Start Date End Date Taking? Authorizing Provider  docusate sodium (COLACE) 100 MG capsule TAKE 1 CAPSULE BY MOUTH TWICE DAILY AS NEEDED CONTSIPATION 02/18/18  Yes Sherlene Shams, MD  haloperidol (HALDOL) 2 MG/ML solution Take 1 mg by mouth as directed.   Yes [provider]  loperamide (IMODIUM A-D) 2 MG tablet Take 2 tablets (4 mg total) by mouth 4 (four) times daily as needed for diarrhea or loose stools. 04/26/18  Yes Sharman Cheek, MD  LORazepam (ATIVAN) 0.5 MG tablet TAKE 1 TABLET BY MOUTH TWICE DAILY AS NEEDED ANXIETY 03/27/18  Yes Allegra Grana, FNP  Morphine Sulfate (MORPHINE CONCENTRATE) 10 mg / 0.5 ml concentrated solution Take 0.25 mLs (5 mg total)  by mouth every 2 (two) hours as needed for severe pain. 03/30/18  Yes Allegra Grana, FNP  ondansetron (ZOFRAN) 4 MG tablet TAKE 1 TABLET BY MOUTH EVERY 8 HOURS AS NEEDED NAUSEA AND VOMITING 04/20/18  Yes Arnett, Lyn Records, FNP  polyethylene glycol (MIRALAX / GLYCOLAX) packet Take 17 g by mouth daily as needed for mild constipation. 11/24/17  Yes Gouru, Deanna Artis, MD  polyvinyl alcohol (LIQUIFILM TEARS) 1.4 % ophthalmic solution Place 1 drop into both eyes as needed for dry eyes. 11/24/17  Yes Gouru, Deanna Artis, MD  rifaximin (XIFAXAN) 550 MG TABS tablet Take 550 mg by mouth 2 (two) times daily.  10/08/17  Yes [provider]  sodium bicarbonate 650 MG tablet Take 650 mg by mouth  2 (two) times daily.   Yes [provider]  witch hazel-glycerin (TUCKS) pad Apply topically as needed for itching. 11/24/17  Yes Gouru, Deanna Artis, MD  cholestyramine (QUESTRAN) 4 g packet Take 1 packet (4 g total) by mouth daily. Patient not taking: Reported on 03/27/2018 02/13/18   Allegra Grana, FNP  conjugated estrogens (PREMARIN) vaginal cream Place 1 Applicatorful vaginally daily. Patient not taking: Reported on 05/17/2018 11/25/17   Ramonita Lab, MD  metoCLOPramide (REGLAN) 10 MG tablet Take 1 tablet (10 mg total) by mouth every 6 (six) hours as needed. Patient not taking: Reported on 05/17/2018 04/26/18   Sharman Cheek, MD  terconazole (TERAZOL 3) 0.8 % vaginal cream Place 20 applicators vaginally once a week. Patient not taking: Reported on 05/17/2018 12/22/17   Natale Milch, MD    Family History  Problem Relation Age of Onset  . Hypertension Mother   . Cancer Mother 68       breast  . Hypertension Father   . Pneumonia Father   . Diabetes Brother   . Hypertension Brother   . Cancer Sister 57       breast     Social History   Tobacco Use  . Smoking status: Never Smoker  . Smokeless tobacco: Never Used  Substance Use Topics  . Alcohol use: No  . Drug use: No    Allergies as of 05/17/2018 - Review Complete 05/17/2018  Allergen Reaction Noted  . Tape Rash 02/04/2018    Review of Systems:    Patient cannot provide    Physical Exam:  Vital signs in last 24 hours: Temp:  [98.3 F (36.8 C)-98.7 F (37.1 C)] 98.6 F (37 C) (07/29 0546) Pulse Rate:  [90-118] 90 (07/29 0546) Resp:  [12-20] 20 (07/29 0546) BP: (94-138)/(46-84) 99/49 (07/29 0546) SpO2:  [94 %-100 %] 98 % (07/29 0546) Weight:  [93 lb (42.2 kg)] 93 lb (42.2 kg) (07/28 1043) Last BM Date: 05/16/18 General:   Pleasant, cooperative in NAD Head:  Normocephalic and atraumatic. Eyes:   No icterus.   Conjunctiva pink. PERRLA. Ears:  Normal auditory acuity. Neck:  Supple; no masses or  thyroidomegaly Lungs: Respirations even and unlabored. Lungs clear to auscultation bilaterally.   No wheezes, crackles, or rhonchi.  Heart:  Regular rate and rhythm;  Without murmur, clicks, rubs or gallops Abdomen:  Soft,mildly distended nontender. Normal bowel sounds. No appreciable masses or hepatomegaly.  No rebound or guarding.  Neurologic:  Alert and oriented x1;   Skin:  Intact without significant lesions or rashes. Cervical Nodes:  No significant cervical adenopathy. Psych:  Slightly confused but cooperative.   LAB RESULTS: Recent Labs    05/17/18 1132 05/17/18 1527 05/17/18 2103 05/18/18 0324  WBC 18.7*  --   --  14.1*  HGB 11.7* 11.3* 10.6* 10.5*  HCT 33.5*  --   --  29.8*  PLT 200  --   --  155   BMET Recent Labs    05/17/18 1132 05/18/18 0324  NA 128* 127*  K 5.0 4.9  CL 91* 92*  CO2 22 23  GLUCOSE 139* 101*  BUN 76* 84*  CREATININE 5.19* 5.84*  CALCIUM 9.0 8.3*   LFT Recent Labs    05/17/18 1132  PROT 6.8  ALBUMIN 3.1*  AST 55*  ALT 25  ALKPHOS 125  BILITOT 2.8*   PT/INR Recent Labs    05/17/18 1132  LABPROT 14.6  INR 1.15    STUDIES: No results found.    Impression / Plan:   Susan Greer is a 77 y.o. y/o female with decompensated liver cirrhosis of unknown etiology.  She follows with Dr. [facility] at [facility].  Not been followed since October 2018.  She has been on weekly paracentesis and is unclear why things has not been considered in her.  I also noted that she had a baseline impaired renal function.  On admission was an AKI along with coffee-ground emesis low sodium low chloride.  Hemoglobin has trended down slightly from admission but could also be from rehydration.she also has hepatic encephelopathy   Plan 1.  Diagnostic paracentesis required for hepatic encephalopathy.  To rule out spontaneous bacterial peritonitis.  2.  She has so far not received any antibiotics for SBP prophylaxis with a history of GI bleed and liver  cirrhosis.  All patients with a GI bleed in the setting of liver cirrhosis should be given to phylactic antibiotics renally dosed with the help of pharmacy .   3.  Further evaluation of hepatic encephalopathy in addition to diagnostic paracentesis would require a chest x-ray to rule out pneumonia.  Correct all electrolyte abnormalities.  Rehydrate with volume expanders I would suggest giving 25 g of albumin today and 25 grams with paracentesis.  And closely follow renal functions.  If not improving would need to consider the diagnosis of hepatorenal syndrome.  It is also possible that she has developed AKI if she has not received any volume expanders after her prior paracentesis which she had on 05/11/2018.  Suggest to obtain a renal consult.  Renal ultrasound.  4.  GI bleed the presentation does not fit the clinical picture of a variceal bleed.  But in the setting of decompensated cirrhosis and not had an EGD for a long time I would suggest an upper endoscopy tomorrow after a paracentesis , ascites seems large but not tense, stop all diuretics.  Continue PPI and octreotide.  5.  Patient has been prescribed PRN dose of acetaminophen up to 4 g a day in the setting of liver cirrhosis I think this dose is way too high and should probably be stopped(I D/c acetaminophen).  I also noted that the patient has in addition been prescribed hydrocodone and acetaminophen which further increases the possibility of acetaminophen toxicity. Will check levels  6. For hepatic encephalopathy, correct electrolytes, aim to reverse impaired renal function, lactulose titrated to two soft bowel movements per day , continue Xifaxan.   Thank you for involving me in the care of this patient.      LOS: 1 day   Wyline MoodKiran Caera Enwright, MD  05/18/2018, 7:03 AM

## 2018-05-18 NOTE — Progress Notes (Signed)
Visit made. Patient is currently followed by [facility] at home with a diagnosis of liver cirrhosis. She is a DNR code with out facility DNR in place in the home. [facility] yesterday 7/28 for evaluation of unrelenting coffee ground emesis. Nephrology and Gastroenterology were consulted. Patient lying in bed, alert, sons and daughter in law at bedside. Nephrologist Dr. Cherylann RatelLateef present in the room. Patient is scheduled for a paracentesis today and EGD tomorrow. She is currently receiving albumin, octreotide and IV protonix. Family is interested in pursuing HCPOA, Clinical research associatewriter advised them on the procedure, staff RN Anabella made aware. Will continue to follow and update hospice team. Dayna BarkerKaren Robertson RN, BSN, [facility], [facility] L[facility]ison [phone]

## 2018-05-18 NOTE — Progress Notes (Signed)
[facility]  ROUNDING NOTE   Subjective:  Patient well-known to us. We follow her for chronic kidney disease stage V. She is currently on hospice. She presented now with coffee-ground emesis and GI bleed. Patient appears to have an element of acute renal failure now as well.   Objective:  Vital signs in last 24 hours:  Temp:  [98.4 F (36.9 C)-98.7 F (37.1 C)] 98.6 F (37 C) (07/29 0546) Pulse Rate:  [90-112] 95 (07/29 1100) Resp:  [12-20] 18 (07/29 1100) BP: (89-133)/(46-79) 89/51 (07/29 1100) SpO2:  [94 %-100 %] 97 % (07/29 1100)  Weight change:  Filed Weights   05/17/18 1043  Weight: 42.2 kg (93 lb)    Intake/Output: I/O last 3 completed shifts: In: 364 [I.V.:264; IV Piggyback:100] Out: 200 [Urine:200]   Intake/Output this shift:  No intake/output data recorded.  Physical Exam: General: No acute distress  Head: Normocephalic, atraumatic. Moist oral mucosal membranes  Eyes: Anicteric  Neck: Supple, trachea midline  Lungs:  Clear to auscultation, normal effort  Heart: S1S2 no rubs  Abdomen:  Soft, nontender, bowel sounds present  Extremities: 2+ LLE edema  Neurologic: Awake, alert, following commands  Skin: UE ecchymoses       Basic Metabolic Panel: Recent Labs  Lab 05/17/18 1132 05/18/18 0324  NA 128* 127*  K 5.0 4.9  CL 91* 92*  CO2 22 23  GLUCOSE 139* 101*  BUN 76* 84*  CREATININE 5.19* 5.84*  CALCIUM 9.0 8.3*    Liver Function Tests: Recent Labs  Lab 05/17/18 1132  AST 55*  ALT 25  ALKPHOS 125  BILITOT 2.8*  PROT 6.8  ALBUMIN 3.1*   Recent Labs  Lab 05/17/18 1132  LIPASE 40   No results for input(s): AMMONIA in the last 168 hours.  CBC: Recent Labs  Lab 05/17/18 1132 05/17/18 1527 05/17/18 2103 05/18/18 0324  WBC 18.7*  --   --  14.1*  NEUTROABS 16.5*  --   --   --   HGB 11.7* 11.3* 10.6* 10.5*  HCT 33.5*  --   --  29.8*  MCV 91.5  --   --  91.8  PLT 200  --   --  155    Cardiac Enzymes: Recent Labs   Lab 05/17/18 1132  TROPONINI <0.03    BNP: Invalid input(s): POCBNP  CBG: No results for input(s): GLUCAP in the last 168 hours.  Microbiology: Results for orders placed or performed during the hospital encounter of 11/17/17  Body fluid culture     Status: None   Collection Time: 11/18/17 12:00 PM  Result Value Ref Range Status   Specimen Description   Final    PERITONEAL Performed at [facility], [address] [place], [place] [address]    Special Requests   Final    NONE Performed at [facility], [address] [place], [place] [address]    Gram Stain NO WBC SEEN NO ORGANISMS SEEN   Final   Culture   Final    No growth aerobically or anaerobically. Performed at [facility], [address] [place], [place] [address]    Report Status 11/22/2017 FINAL  Final    Coagulation Studies: Recent Labs    05/17/18 1132  LABPROT 14.6  INR 1.15    Urinalysis: Recent Labs    05/17/18 0329  COLORURINE AMBER*  LABSPEC 1.013  PHURINE 5.0  GLUCOSEU NEGATIVE  HGBUR NEGATIVE  BILIRUBINUR NEGATIVE  KETONESUR NEGATIVE  PROTEINUR NEGATIVE  NITRITE NEGATIVE  LEUKOCYTESUR NEGATIVE      Imaging: No results found.   Medications:   . octreotide  (SANDOSTATIN)    IV infusion 50 mcg/hr (05/18/18 0935)  . pantoprozole (PROTONIX) infusion 8 mg/hr (05/18/18 1047)   . rifaximin  550 mg Oral BID  . sodium bicarbonate  650 mg Oral BID   albuterol, bisacodyl, HYDROcodone-acetaminophen, lactulose, LORazepam, morphine CONCENTRATE, ondansetron **OR** ondansetron (ZOFRAN) IV, polyethylene glycol, polyvinyl alcohol, senna-docusate, witch hazel-glycerin  Assessment/ Plan:  77 y.o. female with past medical history of hypertension, liver cirrhosis possibly secondary to fatty liver disease, SVT, history of uterine fibroids, depression, and vaginal prolapse   1.  Acute renal failure. 2.  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 3.  Hyponatremia. 4.  Anemia of chronic kidney disease and GI bleed. 5.  Coffee-ground emesis with GI bleed. 6.  Metabolic acidosis.  Plan: The patient presented with coffee-ground emesis.  Suspect that she may have underlying esophageal varices given her liver disease.  She has an element of acute renal failure as BUN is up to 84 with a creatinine of 5.84 which is likely secondary to acute GI bleed.  We will start the patient on IV fluid hydration with 0.9 normal saline at 50 cc/h.  Patient is on hospice and has decided against dialysis previously.  She maintains this desire.  Recommend gastroenterology consultation as well.  Long-term prognosis guarded.   LOS: 1 Alcus Bradly 7/29/201911:47 AM

## 2018-05-18 NOTE — Progress Notes (Signed)
[facility] [place] [facility]   PATIENT NAME: Susan CoupJudy Pascal    MR#:  [id number]  DATE OF BIRTH:  05-21-1941  SUBJECTIVE:   Patient presented to the hospital due to coffee-ground emesis and suspected to have an upper GI bleed.  Patient was also noted to be in acute on chronic renal failure along with some hyponatremia.  REVIEW OF SYSTEMS:    Review of Systems  Constitutional: Negative for chills and fever.  HENT: Negative for congestion and tinnitus.   Eyes: Negative for blurred vision and double vision.  Respiratory: Negative for cough, shortness of breath and wheezing.   Cardiovascular: Negative for chest pain, orthopnea and PND.  Gastrointestinal: Positive for nausea and vomiting. Negative for abdominal pain and diarrhea.  Genitourinary: Negative for dysuria and hematuria.  Neurological: Negative for dizziness, sensory change and focal weakness.  All other systems reviewed and are negative.   Nutrition: Clear Liquid diet Tolerating Diet: Yes Tolerating PT: Await Eval.   DRUG ALLERGIES:   Allergies  Allergen Reactions  . Tape Rash    Adhesive tape    VITALS:  Blood pressure (!) 85/50, pulse 86, temperature 98.6 F (37 C), temperature source Oral, resp. rate 18, height 5\' 3"  (1.6 m), weight 42.2 kg (93 lb), SpO2 97 %.  PHYSICAL EXAMINATION:   Physical Exam  GENERAL:  [age] y.o.-year-old patient lying in bed in no acute distress.  EYES: Pupils equal, round, reactive to light and accommodation. No scleral icterus. Extraocular muscles intact.  HEENT: Head atraumatic, normocephalic. Oropharynx and nasopharynx clear.  NECK:  Supple, no jugular venous distention. No thyroid enlargement, no tenderness.  LUNGS: Normal breath sounds bilaterally, no wheezing, rales, rhonchi. No use of accessory muscles of respiration.  CARDIOVASCULAR: S1, S2 normal. No murmurs, rubs, or gallops.  ABDOMEN: Soft, nontender, distended, + fluid wave consistent with ascites. Bowel  sounds present. No organomegaly or mass.  EXTREMITIES: No cyanosis, clubbing or edema b/l.    NEUROLOGIC: Cranial nerves II through XII are intact. No focal Motor or sensory deficits b/l.  Globally weak.  PSYCHIATRIC: The patient is alert and oriented x 3.  SKIN: No obvious rash, lesion, or ulcer.    LABORATORY PANEL:   CBC Recent Labs  Lab 05/18/18 0324  WBC 14.1*  HGB 10.5*  HCT 29.8*  PLT 155   ------------------------------------------------------------------------------------------------------------------  Chemistries  Recent Labs  Lab 05/17/18 1132 05/18/18 0324  NA 128* 127*  K 5.0 4.9  CL 91* 92*  CO2 22 23  GLUCOSE 139* 101*  BUN 76* 84*  CREATININE 5.19* 5.84*  CALCIUM 9.0 8.3*  AST 55*  --   ALT 25  --   ALKPHOS 125  --   BILITOT 2.8*  --    ------------------------------------------------------------------------------------------------------------------  Cardiac Enzymes Recent Labs  Lab 05/17/18 1132  TROPONINI <0.03   ------------------------------------------------------------------------------------------------------------------  RADIOLOGY:  [place] Paracentesis  Result Date: 05/18/2018 INDICATION: Recurrent ascites EXAM: ULTRASOUND GUIDED  PARACENTESIS MEDICATIONS: None. COMPLICATIONS: None immediate. PROCEDURE: Informed written consent was obtained from the patient after a discussion of the risks, benefits and alternatives to treatment. A timeout was performed prior to the initiation of the procedure. Initial ultrasound scanning demonstrates a large amount of ascites within the right abdominal quadrant. The right abdomen was prepped and draped in the usual sterile fashion. 1% lidocaine was used for local anesthesia. Following this, a 6 Fr Safe-T-Centesis catheter was introduced. An ultrasound image was saved for documentation purposes. The paracentesis was performed. The catheter was removed and  a dressing was applied. The patient tolerated the procedure  well without immediate post procedural complication. FINDINGS: A total of approximately 5.3 L of clear yellow fluid was removed. IMPRESSION: Successful ultrasound-guided paracentesis yielding 5.3 liters of peritoneal fluid. Electronically Signed   By: Alcide Clever M.D.   On: 05/18/2018 12:47     ASSESSMENT AND PLAN:   77 year old female with past medical history of chronic kidney disease stage IV, chronic liver disease/liver cirrhosis, history of SVT, GERD who presented to the hospital due to coffee-ground emesis and noted to be in acute on chronic renal failure with hyponatremia.  1.  GI bleed-suspected to be upper GI bleed given patient's coffee-ground emesis.  Patient's hemoglobin is stable, she has had no further emesis since yesterday. -Continue IV Protonix, octreotide drip.  Seen by gastroenterology and plan for upper GI endoscopy tomorrow.  2.  Acute on chronic renal failure-secondary to volume loss from coffee-ground emesis.  Patient also gets repeated paracentesis for her chronic liver disease. -Seen by nephrology and started on some gentle IV fluids.  No plans for dialysis as per nephrology and also the family. - Renal dose meds, avoid nephrotoxins.  Follow BUN and creatinine. Cont. Sodium Bicarb -Continue albumin post paracentesis.  3.  Hyponatremia- secondary to chronic liver disease. -Continue gentle IV fluids, follow sodium.  4.  Ascites-secondary to chronic liver disease.  Status post diagnostic and therapeutic paracentesis today with 5.3 L of fluid removed.  Fluid analysis is negative for SBP.  5.  History of chronic liver disease- secondary to alcohol abuse. - No evidence of hepatic encephalopathy-continue lactulose, Xifaxan.  Patient presented to the hospital with coffee-ground emesis and is going for upper GI endoscopy [place]. -Appreciate gastroenterology input.   All the records are reviewed and case discussed with Care Management/Social Worker. Management plans  discussed with the patient, family and they are in agreement.  CODE STATUS: DNR  DVT Prophylaxis: Teds/SCDs  TOTAL TIME TAKING CARE OF THIS PATIENT: 30 minutes.   POSSIBLE D/C IN 1-2 DAYS, DEPENDING ON CLINICAL CONDITION.   Houston Siren M.D on 05/18/2018 at 2:02 PM  Between 7am to 6pm - Pager - [phone]  After 6pm go to [email or web address] - Scientist, research (life sciences) [facility]  Office  [phone]  CC: Primary care physician; Allegra Grana, FNP

## 2018-05-18 NOTE — Procedures (Signed)
US paracentesis without difficulty  Complications:  None  Blood Loss: none  See dictation in canopy pacs  

## 2018-05-19 ENCOUNTER — Ambulatory Visit
Admission: RE | Admit: 2018-05-19 | Discharge: 2018-05-19 | Disposition: A | Discharge status: Home / Self Care | Source: Ambulatory Visit | Attending: Nephrology | Admitting: Nephrology

## 2018-05-19 ENCOUNTER — Other Ambulatory Visit: Payer: Self-pay | Admitting: Family

## 2018-05-19 ENCOUNTER — Encounter
Admission: EM | Disposition: A | Discharge status: Hospice | Payer: Self-pay | Source: Home / Self Care | Attending: Specialist

## 2018-05-19 ENCOUNTER — Inpatient Hospital Stay: Admitting: Anesthesiology

## 2018-05-19 ENCOUNTER — Encounter: Payer: Self-pay | Admitting: Anesthesiology

## 2018-05-19 DIAGNOSIS — K920 Hematemesis: Principal | ICD-10-CM

## 2018-05-19 HISTORY — PX: ESOPHAGOGASTRODUODENOSCOPY (EGD) WITH PROPOFOL: SHX5813

## 2018-05-19 LAB — BASIC METABOLIC PANEL
ANION GAP: 9 (ref 5–15)
BUN: 89 mg/dL — ABNORMAL HIGH (ref 8–23)
CALCIUM: 7.9 mg/dL — AB (ref 8.9–10.3)
CO2: 24 mmol/L (ref 22–32)
CREATININE: 6.28 mg/dL — AB (ref 0.44–1.00)
Chloride: 101 mmol/L (ref 98–111)
GFR, EST AFRICAN AMERICAN: 7 mL/min — AB (ref >60)
GFR, EST NON AFRICAN AMERICAN: 6 mL/min — AB (ref >60)
Glucose, Bld: 100 mg/dL — ABNORMAL HIGH (ref 70–99)
Potassium: 4.8 mmol/L (ref 3.5–5.1)
SODIUM: 132 mmol/L — AB (ref 135–145)

## 2018-05-19 LAB — CBC
HCT: 25.3 % — ABNORMAL LOW (ref 35.0–47.0)
HEMOGLOBIN: 8.7 g/dL — AB (ref 12.0–16.0)
MCH: 32 pg (ref 26.0–34.0)
MCHC: 34.3 g/dL (ref 32.0–36.0)
MCV: 93.3 fL (ref 80.0–100.0)
PLATELETS: 102 10*3/uL — AB (ref 150–440)
RBC: 2.71 MIL/uL — AB (ref 3.80–5.20)
RDW: 15.3 % — ABNORMAL HIGH (ref 11.5–14.5)
WBC: 6 10*3/uL (ref 3.6–11.0)

## 2018-05-19 LAB — CYTOLOGY - NON PAP

## 2018-05-19 SURGERY — ESOPHAGOGASTRODUODENOSCOPY (EGD) WITH PROPOFOL
Anesthesia: General

## 2018-05-19 MED ORDER — PANTOPRAZOLE SODIUM 40 MG PO TBEC
40.0000 mg | DELAYED_RELEASE_TABLET | Freq: Two times a day (BID) | ORAL | Status: DC
Start: 2018-05-19 — End: 2018-05-20
  Administered 2018-05-19 – 2018-05-20 (×2): 40 mg via ORAL
  Filled 2018-05-19 (×2): qty 1

## 2018-05-19 MED ORDER — FENTANYL CITRATE (PF) 100 MCG/2ML IJ SOLN: 25.0000 ug | INTRAMUSCULAR | Status: DC | PRN

## 2018-05-19 MED ORDER — ONDANSETRON HCL 4 MG/2ML IJ SOLN: 4.0000 mg | Freq: Once | INTRAMUSCULAR | Status: DC | PRN

## 2018-05-19 MED ORDER — ALBUMIN HUMAN 25 % IV SOLN
25.0000 g | Freq: Three times a day (TID) | INTRAVENOUS | Status: DC
  Administered 2018-05-19 – 2018-05-20 (×3): 25 g via INTRAVENOUS
  Filled 2018-05-19 (×8): qty 100

## 2018-05-19 MED ORDER — SODIUM CHLORIDE 0.9 % IV SOLN
INTRAVENOUS | Status: DC
  Administered 2018-05-19: 12:00:00 via INTRAVENOUS

## 2018-05-19 MED ORDER — PROPOFOL 10 MG/ML IV BOLUS
INTRAVENOUS | Status: DC | PRN
  Administered 2018-05-19: 80 mg via INTRAVENOUS

## 2018-05-19 MED ORDER — LIDOCAINE 2% (20 MG/ML) 5 ML SYRINGE
INTRAMUSCULAR | Status: DC | PRN
  Administered 2018-05-19: 20 mg via INTRAVENOUS

## 2018-05-19 MED ORDER — PROPOFOL 500 MG/50ML IV EMUL
INTRAVENOUS | Status: AC
  Filled 2018-05-19: qty 50

## 2018-05-19 MED ORDER — PROPOFOL 500 MG/50ML IV EMUL
INTRAVENOUS | Status: DC | PRN
  Administered 2018-05-19: 140 ug/kg/min via INTRAVENOUS

## 2018-05-19 NOTE — Progress Notes (Signed)
Chaplain received page for Advance Directive and went to patient room, however, the patient was getting ready to go to a procedure. The family told Chaplain it was ok she could do it later. Chaplain apologize to family and stated for them to have Chaplain page when ready to complete. Family said ok and thank you.

## 2018-05-19 NOTE — Anesthesia Postprocedure Evaluation (Signed)
Anesthesia Post Note  Patient: Susan Greer  Procedure(s) Performed: ESOPHAGOGASTRODUODENOSCOPY (EGD) WITH PROPOFOL (N/A )  Patient location during evaluation: PACU Anesthesia Type: General Level of consciousness: awake and alert and oriented Pain management: pain level controlled Vital Signs Assessment: post-procedure vital signs reviewed and stable Respiratory status: spontaneous breathing Cardiovascular status: blood pressure returned to baseline Anesthetic complications: no     Last Vitals:  Vitals:   05/19/18 1355 05/19/18 2000  BP: (!) 111/56 (!) 100/50  Pulse: 76 79  Resp: 17 20  Temp: 36.4 C 36.8 C  SpO2: 100% 97%    Last Pain:  Vitals:   05/19/18 2000  TempSrc: Oral  PainSc:                  [name]

## 2018-05-19 NOTE — Progress Notes (Signed)
Chaplain received notification from a Hospice nurse indicating that the patient was interested in completing an AD. Chaplain talked with one of the patient's sons about the need. This is something she/they had wanted to complete much earlier. Son asked the chaplain to offer education to his mother about the AD; she declined. Patient and family will page when ready to complete the AD documentation.    05/19/18 1110  Clinical Encounter Type  Visited With Patient and family together  Visit Type Initial  Referral From Nurse  Consult/Referral To Chaplain

## 2018-05-19 NOTE — H&P (Signed)
[name], MD [address], [place], [place], [address] [address], [place], [place], [address] Phone: [phone]  Fax: [phone]  Primary Care Physician:  Allegra Grana, FNP   Pre-Procedure History & Physical: HPI:  Susan Greer is a 77 y.o. female is here for an endoscopy    Past Medical History:  Diagnosis Date  . Cellulitis of left leg   . Chronic kidney disease (CKD), stage V (HCC)   . Cirrhosis of liver (HCC)   . Cirrhosis of liver (HCC)   . GERD (gastroesophageal reflux disease)   . History of kidney stones   . Hypertension   . Kidney stones   . SVT (supraventricular tachycardia) (HCC)    Dr. Lady Gary at [place], s/p adenosine  . UTI (urinary tract infection)     Past Surgical History:  Procedure Laterality Date  . ABDOMINAL HYSTERECTOMY     Dr. Weston Lakin Rhine, for fibroid tumor and endometriosis  . ESOPHAGOGASTRODUODENOSCOPY (EGD) WITH PROPOFOL N/A 01/27/2017   Procedure: ESOPHAGOGASTRODUODENOSCOPY (EGD) WITH PROPOFOL;  Surgeon: Scot Jun, MD;  Location: [facility] ENDOSCOPY;  Service: Endoscopy;  Laterality: N/A;    Prior to Admission medications   Medication Sig Start Date End Date Taking? Authorizing Provider  docusate sodium (COLACE) 100 MG capsule TAKE 1 CAPSULE BY MOUTH TWICE DAILY AS NEEDED CONTSIPATION 02/18/18  Yes Sherlene Shams, MD  haloperidol (HALDOL) 2 MG/ML solution Take 1 mg by mouth as directed.   Yes [provider]  loperamide (IMODIUM A-D) 2 MG tablet Take 2 tablets (4 mg total) by mouth 4 (four) times daily as needed for diarrhea or loose stools. 04/26/18  Yes Sharman Cheek, MD  LORazepam (ATIVAN) 0.5 MG tablet TAKE 1 TABLET BY MOUTH TWICE DAILY AS NEEDED ANXIETY 03/27/18  Yes Arnett, Lyn Records, FNP  Morphine Sulfate (MORPHINE CONCENTRATE) 10 mg / 0.5 ml concentrated solution Take 0.25 mLs (5 mg total) by mouth every 2 (two) hours as needed for severe pain. 03/30/18  Yes Allegra Grana, FNP    ondansetron (ZOFRAN) 4 MG tablet TAKE 1 TABLET BY MOUTH EVERY 8 HOURS AS NEEDED NAUSEA AND VOMITING 04/20/18  Yes Arnett, Lyn Records, FNP  polyethylene glycol (MIRALAX / GLYCOLAX) packet Take 17 g by mouth daily as needed for mild constipation. 11/24/17  Yes Gouru, Deanna Artis, MD  polyvinyl alcohol (LIQUIFILM TEARS) 1.4 % ophthalmic solution Place 1 drop into both eyes as needed for dry eyes. 11/24/17  Yes Gouru, Deanna Artis, MD  rifaximin (XIFAXAN) 550 MG TABS tablet Take 550 mg by mouth 2 (two) times daily.  10/08/17  Yes [provider]  sodium bicarbonate 650 MG tablet Take 650 mg by mouth 2 (two) times daily.   Yes [provider]  witch hazel-glycerin (TUCKS) pad Apply topically as needed for itching. 11/24/17  Yes Gouru, Deanna Artis, MD  cholestyramine (QUESTRAN) 4 g packet Take 1 packet (4 g total) by mouth daily. Patient not taking: Reported on 03/27/2018 02/13/18   Allegra Grana, FNP  conjugated estrogens (PREMARIN) vaginal cream Place 1 Applicatorful vaginally daily. Patient not taking: Reported on 05/17/2018 11/25/17   Ramonita Lab, MD  metoCLOPramide (REGLAN) 10 MG tablet Take 1 tablet (10 mg total) by mouth every 6 (six) hours as needed. Patient not taking: Reported on 05/17/2018 04/26/18   Sharman Cheek, MD  terconazole (TERAZOL 3) 0.8 % vaginal cream Place 20 applicators vaginally once a week. Patient not taking: Reported on 05/17/2018 12/22/17   Natale Milch,  MD    Allergies as of 05/17/2018 - Review Complete 05/17/2018  Allergen Reaction Noted  . Tape Rash 02/04/2018    Family History  Problem Relation Age of Onset  . Hypertension Mother   . Cancer Mother [id number]       breast  . Hypertension Father   . Pneumonia Father   . Diabetes Brother   . Hypertension Brother   . Cancer Sister [id number]       breast    Social History   Socioeconomic History  . Marital status: Widowed    Spouse name: Not on file  . Number of children: Not on file  . Years of education: Not on file   . Highest education level: Not on file  Occupational History  . Not on file  Social Needs  . Financial resource strain: Not on file  . Food insecurity:    Worry: Not on file    Inability: Not on file  . Transportation needs:    Medical: Not on file    Non-medical: Not on file  Tobacco Use  . Smoking status: Never Smoker  . Smokeless tobacco: Never Used  Substance and Sexual Activity  . Alcohol use: No  . Drug use: No  . Sexual activity: Not on file  Lifestyle  . Physical activity:    Days per week: Not on file    Minutes per session: Not on file  . Stress: Not on file  Relationships  . Social connections:    Talks on phone: Not on file    Gets together: Not on file    Attends religious service: Not on file    Active member of club or organization: Not on file    Attends meetings of clubs or organizations: Not on file    Relationship status: Not on file  . Intimate partner violence:    Fear of current or ex partner: Not on file    Emotionally abused: Not on file    Physically abused: Not on file    Forced sexual activity: Not on file  Other Topics Concern  . Not on file  Social History Narrative   Lives in [place]. Widow. 2 sons.      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       Diet - regular      Exercise - outside yardwork, lives on 3 acres and helps son with his property     Review of Systems: See HPI, otherwise negative ROS  Physical Exam: BP (!) 96/48 (BP Location: Left Arm)   Pulse 79   Temp 98.2 F (36.8 C) (Oral)   Resp 20   Ht 5\' 3"  (1.6 m)   Wt 93 lb (42.2 kg)   SpO2 99%   BMI 16.47 kg/m  General:   Alert,  pleasant and cooperative in NAD Head:  Normocephalic and atraumatic. Neck:  Supple; no masses or thyromegaly. Lungs:  Clear throughout to auscultation, normal respiratory effort.    Heart:  +S1, +S2, Regular rate and rhythm, No edema. Abdomen:  Soft, nontender and nondistended. Normal bowel sounds, without  guarding, and without rebound.   Neurologic:  Alert and  oriented x4;  grossly normal neurologically.  Impression/Plan: Susan Greer is here for an endoscopy  to be performed for  evaluation of GI bleed    Risks, benefits, limitations, and alternatives regarding endoscopy have been reviewed with the patient.  Questions have been answered.  All parties  agreeable.   [name], MD  05/19/2018, 12:09 PM

## 2018-05-19 NOTE — Progress Notes (Signed)
Chaplain was page that patient was back from procedure and ready to complete AD. Chaplain called notary and rounded up two witness and AD was completed

## 2018-05-19 NOTE — Transfer of Care (Signed)
Immediate Anesthesia Transfer of Care Note  Patient: Susan Greer  Procedure(s) Performed: ESOPHAGOGASTRODUODENOSCOPY (EGD) WITH PROPOFOL (N/A )  Patient Location: PACU and Endoscopy Unit  Anesthesia Type:General  Level of Consciousness: awake and patient cooperative  Airway & Oxygen Therapy: Patient Spontanous Breathing and Patient connected to nasal cannula oxygen  Post-op Assessment: Report given to RN and Post -op Vital signs reviewed and stable  Post vital signs: Reviewed and stable  Last Vitals:  Vitals Value Taken Time  BP 90/55 05/19/2018 12:37 PM  Temp 35.4 C 05/19/2018 12:38 PM  Pulse 74 05/19/2018 12:37 PM  Resp 18 05/19/2018 12:37 PM  SpO2 100 % 05/19/2018 12:37 PM  Vitals shown include unvalidated device data.  Last Pain:  Vitals:   05/19/18 1238  TempSrc: Tympanic  PainSc:          Complications: No apparent anesthesia complications

## 2018-05-19 NOTE — Progress Notes (Signed)
[facility]  ROUNDING NOTE   Subjective:  Patient seen at bedside. Creatinine up to 6.28. Patient underwent paracentesis yesterday. Patient for endoscopy today.   Objective:  Vital signs in last 24 hours:  Temp:  [95.7 F (35.4 C)-98.5 F (36.9 C)] 95.7 F (35.4 C) (07/30 1238) Pulse Rate:  [64-86] 78 (07/30 1300) Resp:  [13-20] 20 (07/30 1300) BP: (86-100)/(37-55) 100/53 (07/30 1245) SpO2:  [98 %-100 %] 99 % (07/30 1300)  Weight change:  Filed Weights   05/17/18 1043  Weight: 42.2 kg (93 lb)    Intake/Output: I/O last 3 completed shifts: In: 3049 [P.O.:180; I.V.:2769; IV Piggyback:100] Out: 300 [Urine:300]   Intake/Output this shift:  Total I/O In: 171 [I.V.:171] Out: -   Physical Exam: General: No acute distress  Head: Normocephalic, atraumatic. Moist oral mucosal membranes  Eyes: Anicteric  Neck: Supple, trachea midline  Lungs:  Clear to auscultation, normal effort  Heart: S1S2 no rubs  Abdomen:  Soft, nontender, bowel sounds present  Extremities: 2+ LLE edema  Neurologic: Awake, alert, following commands  Skin: UE ecchymoses       Basic Metabolic Panel: Recent Labs  Lab 05/17/18 1132 05/18/18 0324 05/19/18 0402  NA 128* 127* 132*  K 5.0 4.9 4.8  CL 91* 92* 101  CO2 22 23 24   GLUCOSE 139* 101* 100*  BUN 76* 84* 89*  CREATININE 5.19* 5.84* 6.28*  CALCIUM 9.0 8.3* 7.9*    Liver Function Tests: Recent Labs  Lab 05/17/18 1132  AST 55*  ALT 25  ALKPHOS 125  BILITOT 2.8*  PROT 6.8  ALBUMIN 3.1*   Recent Labs  Lab 05/17/18 1132  LIPASE 40   No results for input(s): AMMONIA in the last 168 hours.  CBC: Recent Labs  Lab 05/17/18 1132 05/17/18 1527 05/17/18 2103 05/18/18 0324 05/19/18 0402  WBC 18.7*  --   --  14.1* 6.0  NEUTROABS 16.5*  --   --   --   --   HGB 11.7* 11.3* 10.6* 10.5* 8.7*  HCT 33.5*  --   --  29.8* 25.3*  MCV 91.5  --   --  91.8 93.3  PLT 200  --   --  155 102*    Cardiac Enzymes: Recent  Labs  Lab 05/17/18 1132  TROPONINI <0.03    BNP: Invalid input(s): POCBNP  CBG: No results for input(s): GLUCAP in the last 168 hours.  Microbiology: Results for orders placed or performed during the hospital encounter of 05/17/18  Body fluid culture     Status: None (Preliminary result)   Collection Time: 05/18/18 11:42 AM  Result Value Ref Range Status   Specimen Description   Final    PERITONEAL Performed at [facility], [address] [place], [place] [address]    Special Requests   Final    NONE Performed at [facility], [address] [place], [place] [address]    Gram Stain   Final    MODERATE WBC PRESENT,BOTH PMN AND MONONUCLEAR NO ORGANISMS SEEN    Culture   Final    NO GROWTH < 24 HOURS Performed at [facility], [address], [place], [place] [address]    Report Status PENDING  Incomplete    Coagulation Studies: Recent Labs    05/17/18 1132  LABPROT 14.6  INR 1.15    Urinalysis: Recent Labs    05/17/18 0329  COLORURINE AMBER*  LABSPEC 1.013  PHURINE 5.0  GLUCOSEU NEGATIVE  HGBUR NEGATIVE  BILIRUBINUR NEGATIVE  KETONESUR NEGATIVE  PROTEINUR NEGATIVE  NITRITE NEGATIVE  LEUKOCYTESUR NEGATIVE      Imaging: [place] Paracentesis  Result Date: 05/18/2018 INDICATION: Recurrent ascites EXAM: ULTRASOUND GUIDED  PARACENTESIS MEDICATIONS: None. COMPLICATIONS: None immediate. PROCEDURE: Informed written consent was obtained from the patient after a discussion of the risks, benefits and alternatives to treatment. A timeout was performed prior to the initiation of the procedure. Initial ultrasound scanning demonstrates a large amount of ascites within the right abdominal quadrant. The right abdomen was prepped and draped in the usual sterile fashion. 1% lidocaine was used for local anesthesia. Following this, a 6 Fr Safe-T-Centesis catheter was introduced. An ultrasound image was saved for documentation purposes. The paracentesis  was performed. The catheter was removed and a dressing was applied. The patient tolerated the procedure well without immediate post procedural complication. FINDINGS: A total of approximately 5.3 L of clear yellow fluid was removed. IMPRESSION: Successful ultrasound-guided paracentesis yielding 5.3 liters of peritoneal fluid. Electronically Signed   By: Alcide CleverMark  Lukens M.D.   On: 05/18/2018 12:47     Medications:   . sodium chloride    . sodium chloride 50 mL/hr at 05/19/18 0951  . [MAR Hold] albumin human    . pantoprozole (PROTONIX) infusion 8 mg/hr (05/19/18 0927)   . [MAR Hold] rifaximin  550 mg Oral BID  . [MAR Hold] sodium bicarbonate  650 mg Oral BID   [MAR Hold] albuterol, [MAR Hold] alum & mag hydroxide-simeth, [MAR Hold] bisacodyl, [MAR Hold] HYDROcodone-acetaminophen, [MAR Hold] lactulose, [MAR Hold] LORazepam, [MAR Hold] morphine CONCENTRATE, [MAR Hold] ondansetron **OR** [MAR Hold] ondansetron (ZOFRAN) IV, [MAR Hold] polyethylene glycol, [MAR Hold] polyvinyl alcohol, [MAR Hold] senna-docusate, [MAR Hold] witch hazel-glycerin  Assessment/ Plan:  77 y.o. female with past medical history of hypertension, liver cirrhosis possibly secondary to fatty liver disease, SVT, history of uterine fibroids, depression, and vaginal prolapse   1.  Acute renal failure. 2.  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 3.  Hyponatremia. 4.  Anemia of chronic kidney disease and GI bleed. 5.  Coffee-ground emesis with GI bleed. 6.  Metabolic acidosis.  Plan: Renal function is worse today as BUN is up to 89 with a creatinine of 6.28.  Suspect that paracentesis may of been playing some role in worsening renal function today.  Continue IV fluid hydration.  We will add albumin 25 g IV every 8 hours to her medication regimen.  She is due for endoscopy today as well as.  Patient previously decided that she would not want renal placement therapy should her kidney function continued to  deteriorate.  As such her prognosis remains guarded.  She is maintained on home hospice.  Further plan as patient progresses.   LOS: 2 Maggy Wyble 7/30/20191:49 PM

## 2018-05-19 NOTE — Op Note (Signed)
[facility] Gastroenterology Patient Name: Susan Greer Procedure Date: 05/19/2018 12:10 PM MRN: [id number] Account #: [id number] Date of Birth: 17-Nov-1940 Admit Type: Inpatient Age: 77 Room: [facility] ENDO ROOM 1 Gender: Female Note Status: Finalized Procedure:            Upper GI endoscopy Indications:          Occult blood in stool Providers:            [name] MD, MD Referring MD:         Lyn Records. Arnett (Referring MD) Medicines:            Monitored Anesthesia Care Complications:        No immediate complications. Procedure:            Pre-Anesthesia Assessment:                       - Prior to the procedure, a History and Physical was                        performed, and patient medications, allergies and                        sensitivities were reviewed. The patient's tolerance of                        previous anesthesia was reviewed.                       - The risks and benefits of the procedure and the                        sedation options and risks were discussed with the                        patient. All questions were answered and informed                        consent was obtained.                       - ASA Grade Assessment: III - A patient with severe                        systemic disease.                       After obtaining informed consent, the endoscope was                        passed under direct vision. Throughout the procedure,                        the patient's blood pressure, pulse, and oxygen                        saturations were monitored continuously. The Endoscope                        was introduced through the mouth, and advanced to the  third part of duodenum. The upper GI endoscopy was                        accomplished with ease. The patient tolerated the                        procedure well. Findings:      The examined duodenum was normal.      The stomach was normal.      The cardia  and gastric fundus were normal on retroflexion.      LA Grade B (one or more mucosal breaks greater than 5 mm, not extending       between the tops of two mucosal folds) esophagitis with no bleeding was       found in the lower third of the esophagus.      The exam was otherwise without abnormality. Impression:           - Normal examined duodenum.                       - Normal stomach.                       - LA Grade B esophagitis.                       - The examination was otherwise normal.                       - No specimens collected. Recommendation:       - Return patient to hospital ward for ongoing care.                       - Advance diet as tolerated today.                       - No uppper GI source for bleeding, no rectal bleeding                        either noted .                       Watch clinically if there is a further drop and signs                        of bleeding can consider a colonoscopy , am unsure if                        she can tolerate the prep. Procedure Code(s):    --- Professional ---                       [phone] [id number], Esophagogastroduodenoscopy, flexible, transoral;                        diagnostic, including collection of specimen(s) by                        brushing or washing, when performed (separate procedure) Diagnosis Code(s):    --- Professional ---  K20.9, Esophagitis, unspecified                       R19.5, Other fecal abnormalities CPT copyright 2017 American Medical Association. All rights reserved. The codes documented in this report are preliminary and upon coder review may  be revised to meet current compliance requirements. [name], MD [name] MD, MD 05/19/2018 12:32:06 PM This report has been signed electronically. Number of Addenda: 0 Note Initiated On: 05/19/2018 12:10 PM      [facility]

## 2018-05-19 NOTE — Progress Notes (Signed)
No N/V or bright red blood noted in several BMs overnight; Hgb trending down; BUN/Cr trending up; BP continues to be soft; resting quietly at this time. NPO for EGD this am. Windy Carinaurner,Katrece Roediger K, RN 6:49 AM 05/19/2018

## 2018-05-19 NOTE — Anesthesia Post-op Follow-up Note (Signed)
Anesthesia QCDR form completed.        

## 2018-05-19 NOTE — Progress Notes (Signed)
   [facility] [place] [facility]   Advance care planning  Hospital Day: 2 days Susan Greer is a 77 y.o. female presenting with Emesis .   Patient has multiple comorbidities including advanced liver disease now with acute on chronic renal failure, and goals of care was discussed with the patient and patient's family at bedside.  Patient is already a DNR and is followed by hospice services.  Advance care planning discussed with patient  with additional Family at bedside. All questions in regards to overall condition and expected prognosis answered. The decision was made to continue current code status  CODE STATUS: dnr Time spent: 16 minutes

## 2018-05-19 NOTE — Progress Notes (Signed)
Visit made. Patient seen lying in bed, alert and oriented, family at bedside. Plan for EGD this morning. She continues on IV Protonix and fluids.Patient requesting to go home after procedure, conversation had with patient and family present. Family will support patient's choice. Family would like to pursue Advanced Directives. Chaplain Sue Lushndrea present at the nurses station and will meet with patient and her family. Will continue to follow and update hospice team. Dayna BarkerKaren Robertson RN, BSN, [facility], hospital liaison [phone]

## 2018-05-19 NOTE — Progress Notes (Signed)
[employer] [facility] [place] [facility]   PATIENT NAME: Susan Greer    MR#:  [id number]  DATE OF BIRTH:  Mar 04, 1941  SUBJECTIVE:   Renal function a bit worse now.  S/p Endoscopy today showing some some grade B esophagitis but no variceal bleeding and no stigmata of acute bleeding.  Hg remained stable.  REVIEW OF SYSTEMS:    Review of Systems  Constitutional: Negative for chills and fever.  HENT: Negative for congestion and tinnitus.   Eyes: Negative for blurred vision and double vision.  Respiratory: Negative for cough, shortness of breath and wheezing.   Cardiovascular: Negative for chest pain, orthopnea and PND.  Gastrointestinal: Positive for nausea and vomiting. Negative for abdominal pain and diarrhea.  Genitourinary: Negative for dysuria and hematuria.  Neurological: Negative for dizziness, sensory change and focal weakness.  All other systems reviewed and are negative.   Nutrition: Regular Tolerating Diet: Yes Tolerating PT: Await Eval.   DRUG ALLERGIES:   Allergies  Allergen Reactions  . Tape Rash    Adhesive tape    VITALS:  Blood pressure (!) 111/56, pulse 76, temperature 97.6 F (36.4 C), temperature source Oral, resp. rate 17, height 5\' 3"  (1.6 m), weight 42.2 kg (93 lb), SpO2 100 %.  PHYSICAL EXAMINATION:   Physical Exam  GENERAL:  77 y.o.-year-old patient lying in bed in no acute distress.  EYES: Pupils equal, round, reactive to light and accommodation. No scleral icterus. Extraocular muscles intact.  HEENT: Head atraumatic, normocephalic. Oropharynx and nasopharynx clear.  NECK:  Supple, no jugular venous distention. No thyroid enlargement, no tenderness.  LUNGS: Normal breath sounds bilaterally, no wheezing, rales, rhonchi. No use of accessory muscles of respiration.  CARDIOVASCULAR: S1, S2 normal. No murmurs, rubs, or gallops.  ABDOMEN: Soft, nontender, distended, + fluid wave consistent with ascites. Bowel sounds present. No organomegaly  or mass.  EXTREMITIES: No cyanosis, clubbing or edema b/l.    NEUROLOGIC: Cranial nerves II through XII are intact. No focal Motor or sensory deficits b/l.  Globally weak.  PSYCHIATRIC: The patient is alert and oriented x 3.  SKIN: No obvious rash, lesion, or ulcer.    LABORATORY PANEL:   CBC Recent Labs  Lab 05/19/18 0402  WBC 6.0  HGB 8.7*  HCT 25.3*  PLT 102*   ------------------------------------------------------------------------------------------------------------------  Chemistries  Recent Labs  Lab 05/17/18 1132  05/19/18 0402  NA 128*   < > 132*  K 5.0   < > 4.8  CL 91*   < > 101  CO2 22   < > 24  GLUCOSE 139*   < > 100*  BUN 76*   < > 89*  CREATININE 5.19*   < > 6.28*  CALCIUM 9.0   < > 7.9*  AST 55*  --   --   ALT 25  --   --   ALKPHOS 125  --   --   BILITOT 2.8*  --   --    < > = values in this interval not displayed.   ------------------------------------------------------------------------------------------------------------------  Cardiac Enzymes Recent Labs  Lab 05/17/18 1132  TROPONINI <0.03   ------------------------------------------------------------------------------------------------------------------  RADIOLOGY:  US Paracentesis  Result Date: 05/18/2018 INDICATION: Recurrent ascites EXAM: ULTRASOUND GUIDED  PARACENTESIS MEDICATIONS: None. COMPLICATIONS: None immediate. PROCEDURE: Informed written consent was obtained from the patient after a discussion of the risks, benefits and alternatives to treatment. A timeout was performed prior to the initiation of the procedure. Initial ultrasound scanning demonstrates a large amount of ascites  within the right abdominal quadrant. The right abdomen was prepped and draped in the usual sterile fashion. 1% lidocaine was used for local anesthesia. Following this, a 6 Fr Safe-T-Centesis catheter was introduced. An ultrasound image was saved for documentation purposes. The paracentesis was performed. The  catheter was removed and a dressing was applied. The patient tolerated the procedure well without immediate post procedural complication. FINDINGS: A total of approximately 5.3 L of clear yellow fluid was removed. IMPRESSION: Successful ultrasound-guided paracentesis yielding 5.3 liters of peritoneal fluid. Electronically Signed   By: Alcide CleverMark  Lukens M.D.   On: 05/18/2018 12:47     ASSESSMENT AND PLAN:   77 year old female with past medical history of chronic kidney disease stage IV, chronic liver disease/liver cirrhosis, history of SVT, GERD who presented to the hospital due to coffee-ground emesis and noted to be in acute on chronic renal failure with hyponatremia.  1.  GI bleed-suspected to be upper GI bleed given patient's coffee-ground emesis.   -Hemoglobin remained stable.  No acute bleeding.  Status post endoscopy today showing grade B esophagitis but no variceal bleeding or acute bleeding. -We will DC IV Protonix drip, octreotide drip.  Placed on PPI twice daily.  Started on a regular diet.  Appreciate GI input.  2.  Acute on chronic renal failure-secondary to volume loss from coffee-ground emesis. - Creatinine is a bit worse today.  Nephrology following, schedule Albumin added, continue IV fluids.  Follow BUN and creatinine.  No plans for dialysis as patient does not want that.  Continue sodium bicarbonate. - Renal dose meds, avoid nephrotoxins.  3.  Hyponatremia- secondary to chronic liver disease. -Continue gentle IV fluids and sodium improving.  4.  Ascites-secondary to chronic liver disease.  Status post diagnostic and therapeutic paracentesis today with 5.3 L of fluid removed.  Fluid analysis is negative for SBP.  5.  History of chronic liver disease- secondary to alcohol abuse. - No evidence of hepatic encephalopathy-continue lactulose, Xifaxan.   -Appreciate gastroenterology input.  Likely discharge home tomorrow with hospice services.  All the records are reviewed and case  discussed with Care Management/Social Worker. Management plans discussed with the patient, family and they are in agreement.  CODE STATUS: DNR  DVT Prophylaxis: Teds/SCDs  TOTAL TIME TAKING CARE OF THIS PATIENT: 30 minutes.   POSSIBLE D/C IN 1-2 DAYS, DEPENDING ON CLINICAL CONDITION.   Houston SirenSAINANI,Alexio Sroka J M.D on 05/19/2018 at 2:30 PM  Between 7am to 6pm - Pager - [phone]  After 6pm go to [email or web address] - Scientist, research (life sciences)password [email or web address] [email or web address]  [facility] [facility]  Office  [phone]  CC: Primary care physician; Allegra GranaArnett, Margaret G, FNP

## 2018-05-19 NOTE — Anesthesia Preprocedure Evaluation (Signed)
Anesthesia Evaluation  Patient identified by MRN, date of birth, ID band Patient awake    Reviewed: Allergy & Precautions, NPO status , Patient's Chart, lab work & pertinent test results  History of Anesthesia Complications Negative for: history of anesthetic complications  Airway Mallampati: II  TM Distance: >3 FB Neck ROM: Full    Dental  (+) Poor Dentition   Pulmonary neg pulmonary ROS, neg sleep apnea, neg COPD,    breath sounds clear to auscultation- rhonchi (-) wheezing      Cardiovascular hypertension, Pt. on medications (-) CAD and (-) Past MI + dysrhythmias Supra Ventricular Tachycardia  Rhythm:Regular Rate:Normal - Systolic murmurs and - Diastolic murmurs    Neuro/Psych PSYCHIATRIC DISORDERS Anxiety Depression negative neurological ROS     GI/Hepatic GERD  ,(+) Cirrhosis  (nonalcoholic)      ,   Endo/Other  negative endocrine ROSneg diabetes  Renal/GU Renal InsufficiencyRenal disease (hx of nephrolithiasis)     Musculoskeletal negative musculoskeletal ROS (+)   Abdominal (+) - obese,   Peds  Hematology negative hematology ROS (+)   Anesthesia Other Findings Past Medical History: No date: GERD (gastroesophageal reflux disease) No date: Hypertension No date: Kidney stones No date: SVT (supraventricular tachycardia) (HCC)     [facility], s/p adenosine No date: UTI (urinary tract infection)   Reproductive/Obstetrics                             Anesthesia Physical  Anesthesia Plan  ASA: III  Anesthesia Plan: General   Post-op Pain Management:    Induction: Intravenous  PONV Risk Score and Plan:   Airway Management Planned: Natural Airway and Nasal Cannula  Additional Equipment:   Intra-op Plan:   Post-operative Plan:   Informed Consent: I have reviewed the patients History and Physical, chart, labs and discussed the procedure including the  risks, benefits and alternatives for the proposed anesthesia with the patient or authorized representative who has indicated his/her understanding and acceptance.   Dental advisory given  Plan Discussed with: CRNA and Anesthesiologist  Anesthesia Plan Comments:         Anesthesia Quick Evaluation

## 2018-05-19 NOTE — Progress Notes (Signed)
Initial Nutrition Assessment  DOCUMENTATION CODES:   Severe malnutrition in context of chronic illness  INTERVENTION:   RD will order supplements when diet advanced  NUTRITION DIAGNOSIS:   Severe Malnutrition related to chronic illness(Cirrhosis ) as evidenced by 15 percent weight loss in 6 months, severe fat depletion, severe muscle depletion.  GOAL:   Patient will meet greater than or equal to 90% of their needs  MONITOR:   PO intake, Supplement acceptance, Labs, Weight trends, I & O's, Skin  REASON FOR ASSESSMENT:   Malnutrition Screening Tool    ASSESSMENT:   77 year old female with past medical history of chronic kidney disease stage IV, chronic liver disease/liver cirrhosis, history of SVT, GERD who presented to the hospital due to coffee-ground emesis and noted to be in acute on chronic renal failure with hyponatremia.   -Pt s/p paracentesis 7/22 with 4L fluid removal and 7/29 with 5.3L fluid removal   Visited pt's room today. Pt unhappy at time of RD visit because she has not been allowed to eat anything in 3 days. Pt NPO for EGD today. Did not obtain any nutrition related history from patient as she is followed by hospice. Pt denies any trouble chewing or swallowing. When asked if she drinks any supplements at home or if she would like any supplements here pt states "I don't want to talk about this right now". Per chart, pt with 16lb(15%) weight loss over the past 6 months; this is significant. It is difficult to determine how much weight loss is true muscle loss versus fluid changes as pt with ascites. RD will order supplements when pt's diet is advanced. Recommend liberal diet.    Medications reviewed and include: Na Bicarb, NaCl @20ml /hr, albumin, protonix  Labs reviewed: Na 132(L), BUN 89(H), creat 6.28(H) Hgb 8.7(L), Hct 25.3(L)  NUTRITION - FOCUSED PHYSICAL EXAM:    Most Recent Value  Orbital Region  Moderate depletion  Upper Arm Region  Severe depletion   Thoracic and Lumbar Region  Severe depletion  Buccal Region  Moderate depletion  Temple Region  Severe depletion  Clavicle Bone Region  Severe depletion  Clavicle and Acromion Bone Region  Severe depletion  Scapular Bone Region  Severe depletion  Dorsal Hand  Severe depletion  Patellar Region  Severe depletion  Anterior Thigh Region  Severe depletion  Posterior Calf Region  Severe depletion  Edema (RD Assessment)  Mild  Hair  Reviewed  Eyes  Reviewed  Mouth  Reviewed  Skin  Reviewed  Nails  Reviewed     Diet Order:   Diet Order           Diet clear liquid Room service appropriate? Yes; Fluid consistency: Thin  Diet effective now         EDUCATION NEEDS:   No education needs have been identified at this time  Skin:  Skin Assessment: Reviewed RN Assessment  Last BM:  7/30- type 6  Height:   Ht Readings from Last 1 Encounters:  05/17/18 5\' 3"  (1.6 m)    Weight:   Wt Readings from Last 1 Encounters:  05/17/18 93 lb (42.2 kg)    Ideal Body Weight:  52.3 kg  BMI:  Body mass index is 16.47 kg/m.  Estimated Nutritional Needs:   Kcal:  1200-1400kcal/day   Protein:  63-72g/day   Fluid:  >1.1L/day   Betsey Holidayasey Debroah Shuttleworth MS, RD, LDN Pager #- [phone] Office#- [phone] After Hours Pager: [phone]

## 2018-05-20 LAB — BASIC METABOLIC PANEL
ANION GAP: 8 (ref 5–15)
BUN: 81 mg/dL — AB (ref 8–23)
CALCIUM: 7.7 mg/dL — AB (ref 8.9–10.3)
CO2: 22 mmol/L (ref 22–32)
Chloride: 108 mmol/L (ref 98–111)
Creatinine, Ser: 6.16 mg/dL — ABNORMAL HIGH (ref 0.44–1.00)
GFR calc Af Amer: 7 mL/min — ABNORMAL LOW (ref >60)
GFR, EST NON AFRICAN AMERICAN: 6 mL/min — AB (ref >60)
Glucose, Bld: 126 mg/dL — ABNORMAL HIGH (ref 70–99)
Potassium: 3.7 mmol/L (ref 3.5–5.1)
Sodium: 138 mmol/L (ref 135–145)

## 2018-05-20 LAB — CBC
HCT: 24.8 % — ABNORMAL LOW (ref 35.0–47.0)
Hemoglobin: 8.5 g/dL — ABNORMAL LOW (ref 12.0–16.0)
MCH: 32.1 pg (ref 26.0–34.0)
MCHC: 34.2 g/dL (ref 32.0–36.0)
MCV: 93.9 fL (ref 80.0–100.0)
Platelets: 106 10*3/uL — ABNORMAL LOW (ref 150–440)
RBC: 2.64 MIL/uL — ABNORMAL LOW (ref 3.80–5.20)
RDW: 15.6 % — AB (ref 11.5–14.5)
WBC: 5.1 10*3/uL (ref 3.6–11.0)

## 2018-05-20 MED ORDER — PANTOPRAZOLE SODIUM 40 MG PO TBEC: 40.0000 mg | DELAYED_RELEASE_TABLET | Freq: Every day | ORAL | 1 refills | Status: AC

## 2018-05-20 MED ORDER — ENSURE ENLIVE PO LIQD: 237.0000 mL | Freq: Two times a day (BID) | ORAL | Status: DC

## 2018-05-20 NOTE — Care Management Note (Signed)
Case Management Note  Patient Details  Name: Tamera PuntJudy I Skow MRN: [id number] Date of Birth: Nov 08, 1940   Patient to discharge home today with resumption of hospice services. [name] with [facility] notified of discharge.  Family to transport at discharge.   Expected Discharge Date:  05/20/18               Expected Discharge Plan:  Home w Hospice Care  In-House Referral:     Discharge planning Services  CM Consult  Post Acute Care Choice:  Resumption of Svcs/PTA Provider, Hospice Choice offered to:     DME Arranged:    DME Agency:     HH Arranged:    HH Agency:     Status of Service:  Completed, signed off  If discussed at [employer] Meetings, dates discussed:    Additional Comments:  Chapman FitchBOWEN, Morgann Woodburn T, RN 05/20/2018, 11:30 AM

## 2018-05-20 NOTE — Progress Notes (Signed)
Visit made . Patient seen lying in bed, alert, family at beds die. Last dose of IV albumin running, plan is for discharge home after albumin infusion is completed. Patient to transport via [facility]. Hospice team updated. No new DME needs. Dayna BarkerKaren robertson RN, BSN, [facility], hospital Liaison [phone]

## 2018-05-20 NOTE — Progress Notes (Signed)
[facility]  ROUNDING NOTE   Subjective:  Patient had endoscopy yesterday. Having slightly more abdominal distention now. Renal function remains quite low.   Objective:  Vital signs in last 24 hours:  Temp:  [95.7 F (35.4 C)-98.2 F (36.8 C)] 98 F (36.7 C) (07/31 0458) Pulse Rate:  [64-79] 76 (07/31 0458) Resp:  [13-20] 20 (07/31 0458) BP: (90-111)/(50-59) 100/59 (07/31 0458) SpO2:  [97 %-100 %] 100 % (07/31 0458)  Weight change:  Filed Weights   05/17/18 1043  Weight: 42.2 kg (93 lb)    Intake/Output: I/O last 3 completed shifts: In: 4257.7 [P.O.:180; I.V.:3779.7; IV Piggyback:298] Out: 200 [Urine:200]   Intake/Output this shift:  No intake/output data recorded.  Physical Exam: General: No acute distress  Head: Normocephalic, atraumatic. Moist oral mucosal membranes  Eyes: Anicteric  Neck: Supple, trachea midline  Lungs:  Clear to auscultation, normal effort  Heart: S1S2 no rubs  Abdomen:  Soft, nontender, bowel sounds present, mild distention  Extremities: 2+ LLE edema  Neurologic: Awake, alert, following commands  Skin: UE ecchymoses       Basic Metabolic Panel: Recent Labs  Lab 05/17/18 1132 05/18/18 0324 05/19/18 0402 05/20/18 0355  NA 128* 127* 132* 138  K 5.0 4.9 4.8 3.7  CL 91* 92* 101 108  CO2 22 23 24 22   GLUCOSE 139* 101* 100* 126*  BUN 76* 84* 89* 81*  CREATININE 5.19* 5.84* 6.28* 6.16*  CALCIUM 9.0 8.3* 7.9* 7.7*    Liver Function Tests: Recent Labs  Lab 05/17/18 1132  AST 55*  ALT 25  ALKPHOS 125  BILITOT 2.8*  PROT 6.8  ALBUMIN 3.1*   Recent Labs  Lab 05/17/18 1132  LIPASE 40   No results for input(s): AMMONIA in the last 168 hours.  CBC: Recent Labs  Lab 05/17/18 1132 05/17/18 1527 05/17/18 2103 05/18/18 0324 05/19/18 0402 05/20/18 0355  WBC 18.7*  --   --  14.1* 6.0 5.1  NEUTROABS 16.5*  --   --   --   --   --   HGB 11.7* 11.3* 10.6* 10.5* 8.7* 8.5*  HCT 33.5*  --   --  29.8* 25.3* 24.8*   MCV 91.5  --   --  91.8 93.3 93.9  PLT 200  --   --  155 102* 106*    Cardiac Enzymes: Recent Labs  Lab 05/17/18 1132  TROPONINI <0.03    BNP: Invalid input(s): POCBNP  CBG: No results for input(s): GLUCAP in the last 168 hours.  Microbiology: Results for orders placed or performed during the hospital encounter of 05/17/18  Body fluid culture     Status: None (Preliminary result)   Collection Time: 05/18/18 11:42 AM  Result Value Ref Range Status   Specimen Description   Final    PERITONEAL Performed at [facility], [address], [place], [place] [address]    Special Requests   Final    NONE Performed at [facility], [address] [place], [place] [address]    Gram Stain   Final    MODERATE WBC PRESENT,BOTH PMN AND MONONUCLEAR NO ORGANISMS SEEN    Culture   Final    NO GROWTH 2 DAYS Performed at [facility], [place] [address] [place], [place] [address]    Report Status PENDING  Incomplete    Coagulation Studies: Recent Labs    05/17/18 1132  LABPROT 14.6  INR 1.15    Urinalysis: No results for input(s): COLORURINE, LABSPEC,  PHURINE, GLUCOSEU, HGBUR, BILIRUBINUR, KETONESUR, PROTEINUR, UROBILINOGEN, NITRITE, LEUKOCYTESUR in the last 72 hours.  Invalid input(s): APPERANCEUR    Imaging: US Paracentesis  Result Date: 05/18/2018 INDICATION: Recurrent ascites EXAM: ULTRASOUND GUIDED  PARACENTESIS MEDICATIONS: None. COMPLICATIONS: None immediate. PROCEDURE: Informed written consent was obtained from the patient after a discussion of the risks, benefits and alternatives to treatment. A timeout was performed prior to the initiation of the procedure. Initial ultrasound scanning demonstrates a large amount of ascites within the right abdominal quadrant. The right abdomen was prepped and draped in the usual sterile fashion. 1% lidocaine was used for local anesthesia. Following this, a 6 Fr Safe-T-Centesis catheter was introduced. An  ultrasound image was saved for documentation purposes. The paracentesis was performed. The catheter was removed and a dressing was applied. The patient tolerated the procedure well without immediate post procedural complication. FINDINGS: A total of approximately 5.3 L of clear yellow fluid was removed. IMPRESSION: Successful ultrasound-guided paracentesis yielding 5.3 liters of peritoneal fluid. Electronically Signed   By: Inez Catalina M.D.   On: 05/18/2018 12:47     Medications:   . sodium chloride 50 mL/hr at 05/20/18 0520  . albumin human 25 g (05/20/18 0936)   . feeding supplement (ENSURE ENLIVE)  237 mL Oral BID BM  . pantoprazole  40 mg Oral BID AC  . rifaximin  550 mg Oral BID  . sodium bicarbonate  650 mg Oral BID   albuterol, alum & mag hydroxide-simeth, bisacodyl, fentaNYL (SUBLIMAZE) injection, HYDROcodone-acetaminophen, lactulose, LORazepam, morphine CONCENTRATE, ondansetron **OR** ondansetron (ZOFRAN) IV, ondansetron (ZOFRAN) IV, polyethylene glycol, polyvinyl alcohol, senna-docusate, witch hazel-glycerin  Assessment/ Plan:  77 y.o. female with past medical history of hypertension, liver cirrhosis possibly secondary to fatty liver disease, SVT, history of uterine fibroids, depression, and vaginal prolapse   1.  Acute renal failure. 2.  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 3.  Hyponatremia. 4.  Anemia of chronic kidney disease and GI bleed. 5.  Coffee-ground emesis with GI bleed. 6.  Metabolic acidosis.  Plan: Patient continues to have very diminished renal function.  Creatinine is currently 6.16 with an EGFR of 6.  As before she has decided against renal replacement therapy.  She is on home with hospice services.  She requests discharge home.  This is reasonable at the moment.  She continues to have periodic paracentesis performed for underlying ascites.  Appreciate gastroenterology input.  We will continue to monitor her as an outpatient at her  request.   LOS: 3 [place] 7/31/201911:00 AM

## 2018-05-20 NOTE — Progress Notes (Signed)
Discharge order received. Patient is alert and oriented. Vital signs stable . No signs of acute distress. Discharge instructions given. Patient verbalized understanding. No other issues noted at this time.   

## 2018-05-21 LAB — BODY FLUID CULTURE: Culture: NO GROWTH

## 2018-05-21 NOTE — Discharge Summary (Signed)
[employer] - [place] [facility]   PATIENT NAME: Susan Greer    MR#:  [id number]  DATE OF BIRTH:  August 06, 1941  DATE OF ADMISSION:  05/17/2018 ADMITTING PHYSICIAN: Shaune Pollack, MD  DATE OF DISCHARGE: 05/20/2018  1:34 PM  PRIMARY CARE PHYSICIAN: Allegra Grana, FNP    ADMISSION DIAGNOSIS:  Hyponatremia [E87.1] Hematemesis with nausea [K92.0] Other elevated white blood cell (WBC) count [D72.828] Non-intractable vomiting with nausea, unspecified vomiting type [R11.2]  DISCHARGE DIAGNOSIS:  Active Problems:   GIB (gastrointestinal bleeding)   SECONDARY DIAGNOSIS:   Past Medical History:  Diagnosis Date  . Cellulitis of left leg   . Chronic kidney disease (CKD), stage V (HCC)   . Cirrhosis of liver (HCC)   . Cirrhosis of liver (HCC)   . GERD (gastroesophageal reflux disease)   . History of kidney stones   . Hypertension   . Kidney stones   . SVT (supraventricular tachycardia) (HCC)    Dr. Lady Gary at [place], s/p adenosine  . UTI (urinary tract infection)     HOSPITAL COURSE:   77 year old female with past medical history of chronic kidney disease stage IV, chronic liver disease/liver cirrhosis, history of SVT, GERD who presented to the hospital due to coffee-ground emesis and noted to be in acute on chronic renal failure with hyponatremia.  1.  GI bleed-suspected to be upper GI bleed given patient's coffee-ground emesis.   -Patient was admitted to the hospital seen by gastroenterology and underwent an endoscopy showing grade B esophagitis but no variceal bleeding or acute bleeding. She initially patient was placed on a Protonix drip, has been taken off of that now being discharged on Protonix.  She is tolerating a regular diet well.  2.  Acute on chronic renal failure-secondary to volume loss from coffee-ground emesis. Patient creatinine got bit worse since admission, patient was given some albumin and IV fluids, creatinine has slightly improved.  Patient  will continue sodium bicarbonate.  She was seen by nephrology and will follow up her renal function as an outpatient.  Patient is on hospice and therefore does not want to be on dialysis.  3.  Hyponatremia- secondary to chronic liver disease. - improved and resolved with IV fluid hydration.  4.  Ascites-secondary to chronic liver disease.  Status post diagnostic and therapeutic paracentesis with 5.3 L of fluid removed.  Fluid analysis was negative for SBP.  5.  History of chronic liver disease- secondary to alcohol abuse. - No evidence of hepatic encephalopathy- pt. Will continue lactulose, Xifaxan.    Patient was discharged home with hospice services.   DISCHARGE CONDITIONS:   Stable  CONSULTS OBTAINED:    DRUG ALLERGIES:   Allergies  Allergen Reactions  . Tape Rash    Adhesive tape    DISCHARGE MEDICATIONS:   Allergies as of 05/20/2018      Reactions   Tape Rash   Adhesive tape      Medication List    STOP taking these medications   cholestyramine 4 g packet Commonly known as:  QUESTRAN   conjugated estrogens vaginal cream Commonly known as:  PREMARIN   metoCLOPramide 10 MG tablet Commonly known as:  REGLAN   terconazole 0.8 % vaginal cream Commonly known as:  TERAZOL 3     TAKE these medications   docusate sodium 100 MG capsule Commonly known as:  COLACE TAKE 1 CAPSULE BY MOUTH TWICE DAILY AS NEEDED CONTSIPATION   haloperidol 2 MG/ML solution Commonly known as:  HALDOL Take  1 mg by mouth as directed.   loperamide 2 MG tablet Commonly known as:  IMODIUM A-D Take 2 tablets (4 mg total) by mouth 4 (four) times daily as needed for diarrhea or loose stools.   LORazepam 0.5 MG tablet Commonly known as:  ATIVAN TAKE 1 TABLET BY MOUTH TWICE DAILY AS NEEDED ANXIETY   morphine CONCENTRATE 10 mg / 0.5 ml concentrated solution Take 0.25 mLs (5 mg total) by mouth every 2 (two) hours as needed for severe pain.   ondansetron 4 MG tablet Commonly known  as:  ZOFRAN TAKE 1 TABLET BY MOUTH EVERY 8 HOURS AS NEEDED NAUSEA AND VOMITING   pantoprazole 40 MG tablet Commonly known as:  PROTONIX Take 1 tablet (40 mg total) by mouth daily.   polyethylene glycol packet Commonly known as:  MIRALAX / GLYCOLAX Take 17 g by mouth daily as needed for mild constipation.   polyvinyl alcohol 1.4 % ophthalmic solution Commonly known as:  LIQUIFILM TEARS Place 1 drop into both eyes as needed for dry eyes.   rifaximin 550 MG Tabs tablet Commonly known as:  XIFAXAN Take 550 mg by mouth 2 (two) times daily.   sodium bicarbonate 650 MG tablet Take 650 mg by mouth 2 (two) times daily.   witch hazel-glycerin pad Commonly known as:  TUCKS Apply topically as needed for itching.         DISCHARGE INSTRUCTIONS:   DIET:  Regular diet  DISCHARGE CONDITION:  Stable  ACTIVITY:  Activity as tolerated  OXYGEN:  Home Oxygen: No.   Oxygen Delivery: room air  DISCHARGE LOCATION:  Home with Hospice.    If you experience worsening of your admission symptoms, develop shortness of breath, life threatening emergency, suicidal or homicidal thoughts you must seek medical attention immediately by calling 911 or calling your MD immediately  if symptoms less severe.  You Must read complete instructions/literature along with all the possible adverse reactions/side effects for all the Medicines you take and that have been prescribed to you. Take any new Medicines after you have completely understood and accpet all the possible adverse reactions/side effects.   Please note  You were cared for by a hospitalist during your hospital stay. If you have any questions about your discharge medications or the care you received while you were in the hospital after you are discharged, you can call the unit and asked to speak with the hospitalist on call if the hospitalist that took care of you is not available. Once you are discharged, your primary care physician will  handle any further medical issues. Please note that NO REFILLS for any discharge medications will be authorized once you are discharged, as it is imperative that you return to your primary care physician (or establish a relationship with a primary care physician if you do not have one) for your aftercare needs so that they can reassess your need for medications and monitor your lab values.     Today   No acute events overnight, no further bleeding no abdominal pain.  Renal function is still not back to baseline but patient really wants to go home.  Discussed with nephrology and with discharge home with hospice services with follow-up with him as an outpatient.  VITAL SIGNS:  Blood pressure (!) 100/59, pulse 76, temperature 98 F (36.7 C), temperature source Oral, resp. rate 20, height 5\' 3"  (1.6 m), weight 42.2 kg (93 lb), SpO2 100 %.  I/O:  No intake or output data in the 24  hours ending 05/21/18 1501  PHYSICAL EXAMINATION:   GENERAL:  77 y.o.-year-old patient lying in bed in no acute distress.  EYES: Pupils equal, round, reactive to light and accommodation. No scleral icterus. Extraocular muscles intact.  HEENT: Head atraumatic, normocephalic. Oropharynx and nasopharynx clear.  NECK:  Supple, no jugular venous distention. No thyroid enlargement, no tenderness.  LUNGS: Normal breath sounds bilaterally, no wheezing, rales, rhonchi. No use of accessory muscles of respiration.  CARDIOVASCULAR: S1, S2 normal. No murmurs, rubs, or gallops.  ABDOMEN: Soft, nontender, ND. Bowel sounds present. No organomegaly or mass.  EXTREMITIES: No cyanosis, clubbing or edema b/l.    NEUROLOGIC: Cranial nerves II through XII are intact. No focal Motor or sensory deficits b/l.  Globally weak.  PSYCHIATRIC: The patient is alert and oriented x 3.  SKIN: No obvious rash, lesion, or ulcer.   DATA REVIEW:   CBC Recent Labs  Lab 05/20/18 0355  WBC 5.1  HGB 8.5*  HCT 24.8*  PLT 106*    Chemistries   Recent Labs  Lab 05/17/18 1132  05/20/18 0355  NA 128*   < > 138  K 5.0   < > 3.7  CL 91*   < > 108  CO2 22   < > 22  GLUCOSE 139*   < > 126*  BUN 76*   < > 81*  CREATININE 5.19*   < > 6.16*  CALCIUM 9.0   < > 7.7*  AST 55*  --   --   ALT 25  --   --   ALKPHOS 125  --   --   BILITOT 2.8*  --   --    < > = values in this interval not displayed.    Cardiac Enzymes Recent Labs  Lab 05/17/18 1132  TROPONINI <0.03    Microbiology Results  Results for orders placed or performed during the hospital encounter of 05/17/18  Body fluid culture     Status: None   Collection Time: 05/18/18 11:42 AM  Result Value Ref Range Status   Specimen Description   Final    PERITONEAL Performed at [facility], [address], [place], [place] [address]    Special Requests   Final    NONE Performed at [facility], [address] [place], [place] [address]    Gram Stain   Final    MODERATE WBC PRESENT,BOTH PMN AND MONONUCLEAR NO ORGANISMS SEEN    Culture   Final    NO GROWTH 3 DAYS Performed at [facility], [address] [place], [place] [address]    Report Status 05/21/2018 FINAL  Final    RADIOLOGY:  No results found.    Management plans discussed with the patient, family and they are in agreement.  CODE STATUS:  Code Status History    Date Active Date Inactive Code Status Order ID Comments User Context   05/17/2018 1507 05/20/2018 1639 DNR [id number]  Shaune Pollack, MD Inpatient  Questions for Most Recent Historical Code Status (Order [id number])    Question Answer Comment   In the event of cardiac or respiratory ARREST Do not call a "code blue"    In the event of cardiac or respiratory ARREST Do not perform Intubation, CPR, defibrillation or ACLS    In the event of cardiac or respiratory ARREST Use medication by any route, position, wound care, and other measures to relive pain and suffering. May use oxygen, suction and manual treatment of airway  obstruction  as needed for comfort.         Advance Directive Documentation     Most Recent Value  Type of Advance Directive  Out of facility DNR (pink MOST or yellow form)  Pre-existing out of facility DNR order (yellow form or pink MOST form)  Physician notified to receive inpatient order  "MOST" Form in Place?  -      TOTAL TIME TAKING CARE OF THIS PATIENT: 40 minutes.    Houston Siren M.D on 05/21/2018 at 3:01 PM  Between 7am to 6pm - Pager - [phone]  After 6pm go to [email or web address] - Scientist, research (life sciences) [facility]  Office  [phone]  CC: Primary care physician; Allegra Grana, FNP

## 2018-05-22 ENCOUNTER — Telehealth: Payer: Self-pay | Admitting: *Deleted

## 2018-05-22 ENCOUNTER — Telehealth: Payer: Self-pay | Admitting: Family

## 2018-05-22 DIAGNOSIS — R188 Other ascites: Principal | ICD-10-CM

## 2018-05-22 DIAGNOSIS — K7460 Unspecified cirrhosis of liver: Secondary | ICD-10-CM

## 2018-05-22 LAB — LIPASE, FLUID: Lipase-Fluid: 26 U/L

## 2018-05-22 MED ORDER — MORPHINE SULFATE (CONCENTRATE) 10 MG /0.5 ML PO SOLN: 5.0000 mg | ORAL | 0 refills | Status: AC | PRN

## 2018-05-22 NOTE — Telephone Encounter (Signed)
Call hospice nurse  I reviewed GI note from hospitalization and patient is NO longer on products with acetaminophen due to liver disease  Please ensure ALL acetaminophen products including norco/vicodin have been discontinued from hospice order set  Morphine has been refilled.

## 2018-05-22 NOTE — Telephone Encounter (Signed)
[facility] RN Advised of below regarding script She verbalized understanding regarding Tylenol and Norco/Vicodin being discontinued. . They are using Haldol for agitation  as need and hasn't needed except 1 time.  Dr Cherylann RatelLateef thinks if patient has another paracentesis not sure if she will make it through procedure today due to creatinine is high.   She is scheduled for another paracentesis on Tuesday and every Tuesday going forward.

## 2018-05-22 NOTE — Telephone Encounter (Signed)
Patient DC Hospice care no TCM. FYI

## 2018-05-22 NOTE — Telephone Encounter (Signed)
Copied from CRM [id number]. Topic: Quick Communication - Rx Refill/Question >> May 22, 2018 10:04 AM Gaynelle AduPoole, Shalonda wrote: Medication:Morphine Sulfate (MORPHINE CONCENTRATE) 5 mg / 0.25 ml concentrated solution Has the patient contacted their pharmacy? no  Preferred Pharmacy (with phone number or street name): [facility] [place],  - [address] [phone] (Phone) [phone] (Fax)      Agent: Please be advised that RX refills may take up to 3 business days. We ask that you follow-up with your pharmacy.

## 2018-05-22 NOTE — Telephone Encounter (Signed)
rx request Last seen on 12-01-17 Last refilled on 03-30-18 Next appt- 06-03-18

## 2018-05-22 NOTE — Telephone Encounter (Signed)
noted 

## 2018-05-25 ENCOUNTER — Ambulatory Visit

## 2018-05-25 ENCOUNTER — Telehealth: Payer: Self-pay

## 2018-05-25 NOTE — Telephone Encounter (Signed)
Flagged on EMMI report for not reading discharge papers.  [facility] and [facility].  Son states patient is unable to answer/speak on phone, however he and other family members are available to assist.  He said they did receive discharge papers and have them readily available to reference as needed.  No questions or concerns at this time.

## 2018-05-26 ENCOUNTER — Ambulatory Visit
Admission: RE | Admit: 2018-05-26 | Discharge: 2018-05-26 | Disposition: A | Discharge status: Home / Self Care | Source: Ambulatory Visit | Attending: Nephrology | Admitting: Nephrology

## 2018-05-26 DIAGNOSIS — R188 Other ascites: Secondary | ICD-10-CM | POA: Diagnosis not present

## 2018-05-26 MED ORDER — ALBUMIN HUMAN 25 % IV SOLN
INTRAVENOUS | Status: AC
  Administered 2018-05-26: 25 g via INTRAVENOUS
  Filled 2018-05-26: qty 100

## 2018-05-26 MED ORDER — ALBUMIN HUMAN 25 % IV SOLN
25.0000 g | Freq: Once | INTRAVENOUS | Status: AC
  Administered 2018-05-26: 25 g via INTRAVENOUS

## 2018-05-26 NOTE — Procedures (Signed)
Interventional Radiology Procedure Note  Procedure: US guided paracentesis.  4L max  The family of Susan Greer was present for our para consent.  We also discussed the possibility of a tunneled peritoneal catheter, for convenience of fluid drainage.  For completeness sake, I did also let them know that solutions for her recurrent ascites and the worsening hepato-renal syndrome would include TIPS (for which she is not a candidate), [place] Shunt (for which she may/may not be a candidate), continued large volume paracentesis as is our current strategy, as well as the tunneled peritoneal catheter.   They let me know that she in fact had a prior surgical consult and appointment for the pleurex, and arrived for the appointment.  For some reason, last minute decision was made not to proceed with placement.    Susan Greer remains a candidate for either large volume para vs pleurex.  If the family would like to pursue, we are happy to provide this service.  She could alternatively return to her surgeon.  .  Complications: None  Recommendations:  - Routine wound care    Signed,  Yvone NeuJaime S. Loreta AveWagner, DO

## 2018-05-28 ENCOUNTER — Ambulatory Visit: Payer: Medicare Other | Admitting: Obstetrics and Gynecology

## 2018-05-29 ENCOUNTER — Inpatient Hospital Stay: Admitting: Family

## 2018-06-01 ENCOUNTER — Ambulatory Visit

## 2018-06-02 ENCOUNTER — Ambulatory Visit
Admission: RE | Admit: 2018-06-02 | Discharge: 2018-06-02 | Disposition: A | Discharge status: Home / Self Care | Source: Ambulatory Visit | Attending: Nephrology | Admitting: Nephrology

## 2018-06-02 DIAGNOSIS — N186 End stage renal disease: Secondary | ICD-10-CM | POA: Diagnosis not present

## 2018-06-02 DIAGNOSIS — R188 Other ascites: Secondary | ICD-10-CM | POA: Diagnosis not present

## 2018-06-02 DIAGNOSIS — K7460 Unspecified cirrhosis of liver: Secondary | ICD-10-CM | POA: Diagnosis not present

## 2018-06-02 MED ORDER — ALBUMIN HUMAN 25 % IV SOLN: 12.5000 g | Freq: Once | INTRAVENOUS | Status: DC

## 2018-06-02 MED ORDER — ALBUMIN HUMAN 25 % IV SOLN
25.0000 g | Freq: Once | INTRAVENOUS | Status: AC
  Administered 2018-06-02: 25 g via INTRAVENOUS

## 2018-06-02 MED ORDER — ALBUMIN HUMAN 25 % IV SOLN
INTRAVENOUS | Status: AC
  Administered 2018-06-02: 25 g via INTRAVENOUS
  Filled 2018-06-02: qty 100

## 2018-06-02 NOTE — Procedures (Signed)
  US guided paracentesis.  Removed 4 liters of yellow fluid.  No immediate complication.  Minimal blood loss.

## 2018-06-03 ENCOUNTER — Telehealth: Payer: Self-pay

## 2018-06-03 ENCOUNTER — Ambulatory Visit: Admitting: Family

## 2018-06-03 NOTE — Telephone Encounter (Signed)
Copied from CRM [id number]. Topic: Quick Communication - Appointment Cancellation >> Jun 03, 2018 10:21 AM Trula SladeWalter, Linda F wrote: Patient called to cancel appointment scheduled for today 06/03/18 at 1:45pm. Patient has not rescheduled their appointment.  Son stated the patient is not feeling well to come to this appt.  They will call back to reschedule the appt.  Route to department's PEC pool.

## 2018-06-04 NOTE — Discharge Instructions (Signed)
Paracentesis, Care After °Refer to this sheet in the next few weeks. These instructions provide you with information about caring for yourself after your procedure. Your health care provider may also give you more specific instructions. Your treatment has been planned according to current medical practices, but problems sometimes occur. Call your health care provider if you have any problems or questions after your procedure. °What can I expect after the procedure? °After your procedure, it is common to have a small amount of clear fluid coming from the puncture site. °Follow these instructions at home: °· Return to your normal activities as told by your health care provider. Ask your health care provider what activities are safe for you. °· Take over-the-counter and prescription medicines only as told by your health care provider. °· Do not take baths, swim, or use a hot tub until your health care provider approves. °· Follow instructions from your health care provider about: °? How to take care of your puncture site. °? When and how you should change your bandage (dressing). °? When you should remove your dressing. °· Check your puncture area every day signs of infection. Watch for: °? Redness, swelling, or pain. °? Fluid, blood, or pus. °· Keep all follow-up visits as told by your health care provider. This is important. °Contact a health care provider if: °· You have redness, swelling, or pain at your puncture site. °· You start to have more clear fluid coming from your puncture site. °· You have blood or pus coming from your puncture site. °· You have chills. °· You have a fever. °Get help right away if: °· You develop chest pain or shortness of breath. °· You develop increasing pain, discomfort, or swelling in your abdomen. °· You feel dizzy or light-headed or you pass out. °This information is not intended to replace advice given to you by your health care provider. Make sure you discuss any questions you  have with your health care provider. °Document Released: 02/21/2015 Document Revised: 03/14/2016 Document Reviewed: 12/20/2014 °Elsevier Interactive Patient Education © 2018 Elsevier Inc. ° °

## 2018-06-08 ENCOUNTER — Ambulatory Visit

## 2018-06-09 ENCOUNTER — Ambulatory Visit
Admission: RE | Admit: 2018-06-09 | Discharge: 2018-06-09 | Disposition: A | Discharge status: Home / Self Care | Source: Ambulatory Visit | Attending: Nephrology | Admitting: Nephrology

## 2018-06-09 DIAGNOSIS — R188 Other ascites: Secondary | ICD-10-CM

## 2018-06-09 DIAGNOSIS — K7031 Alcoholic cirrhosis of liver with ascites: Secondary | ICD-10-CM | POA: Diagnosis not present

## 2018-06-09 MED ORDER — ALBUMIN HUMAN 25 % IV SOLN
INTRAVENOUS | Status: AC
  Filled 2018-06-09: qty 100

## 2018-06-09 MED ORDER — ALBUMIN HUMAN 25 % IV SOLN
25.0000 g | Freq: Once | INTRAVENOUS | Status: AC
  Administered 2018-06-09: 25 g via INTRAVENOUS

## 2018-06-09 NOTE — Procedures (Signed)
US paracentesis without difficulty  Complications:  None  Blood Loss: none  See dictation in canopy pacs  

## 2018-06-13 IMAGING — US US PARACENTESIS
1 series · 3 of 3 positions shown · non-contrast
Comparison: Ultrasound guided paracentesis - 03/12/2018;
03/05/2018; 02/26/2018

MEDICATIONS:
None.

COMPLICATIONS:
None immediate.

INDICATION: Recurrent symptomatic ascites. Please perform ultrasound-guided
paracentesis for therapeutic purposes.

EXAM:
ULTRASOUND-GUIDED PARACENTESIS
TECHNIQUE: Informed written consent was obtained from the patient after a
discussion of the risks, benefits and alternatives to treatment. A
timeout was performed prior to the initiation of the procedure.

[Series 1: us paracentesis · 3 of 3 slices shown]
[im 1/3]
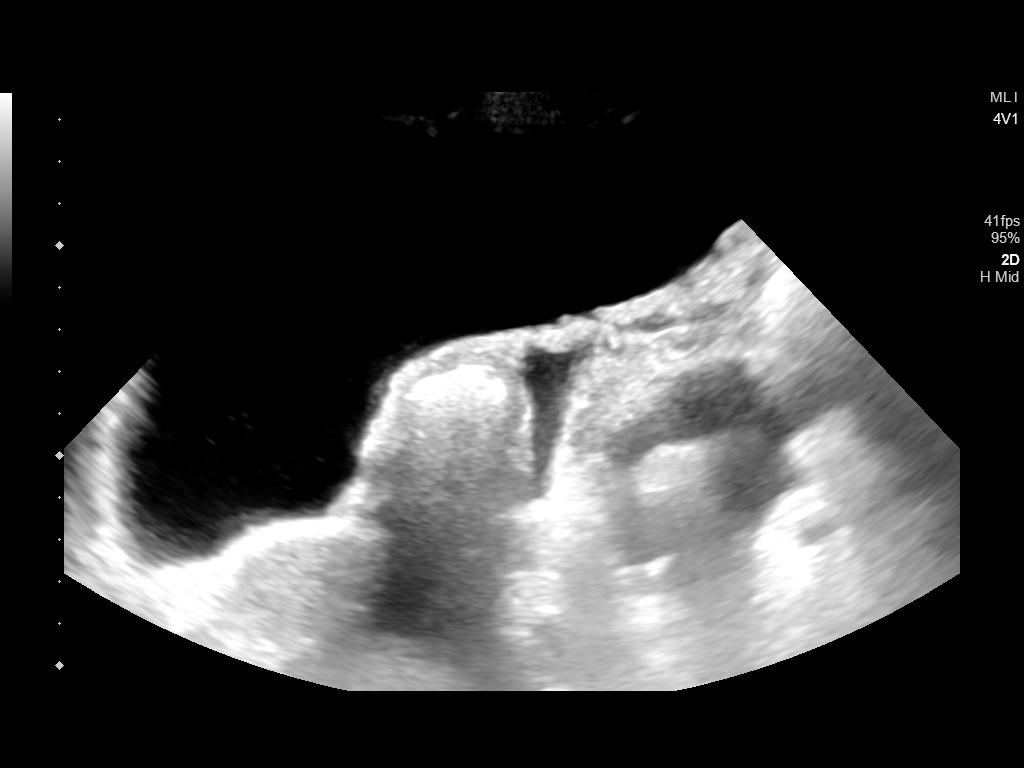
[im 2/3]
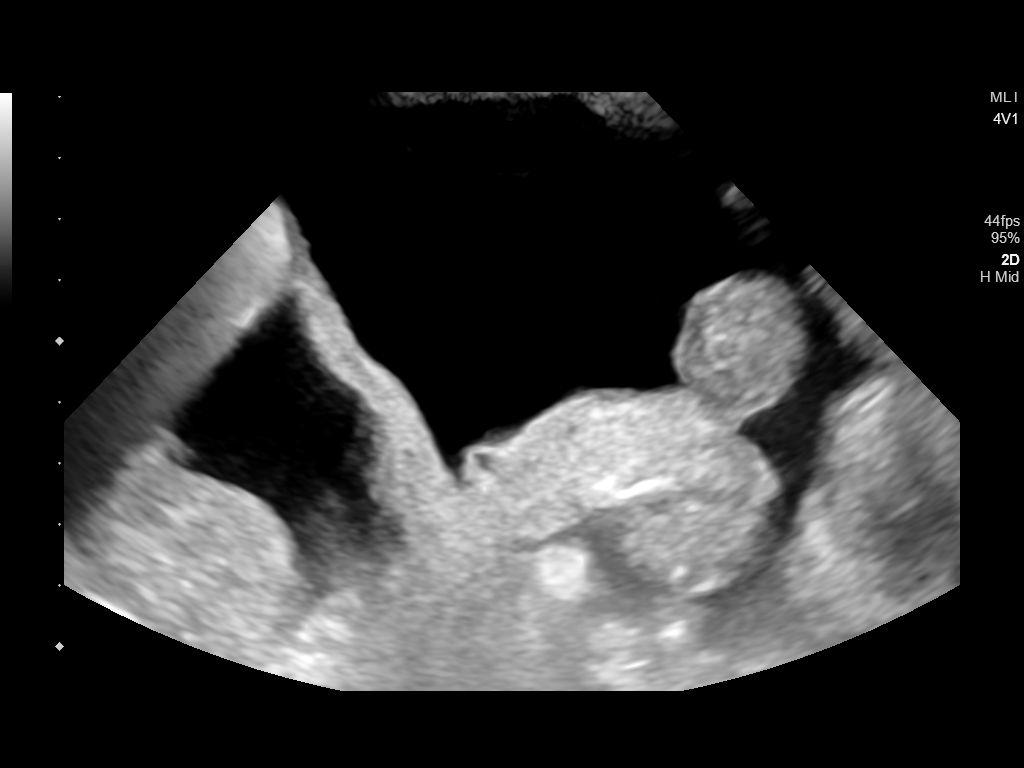
[im 3/3]
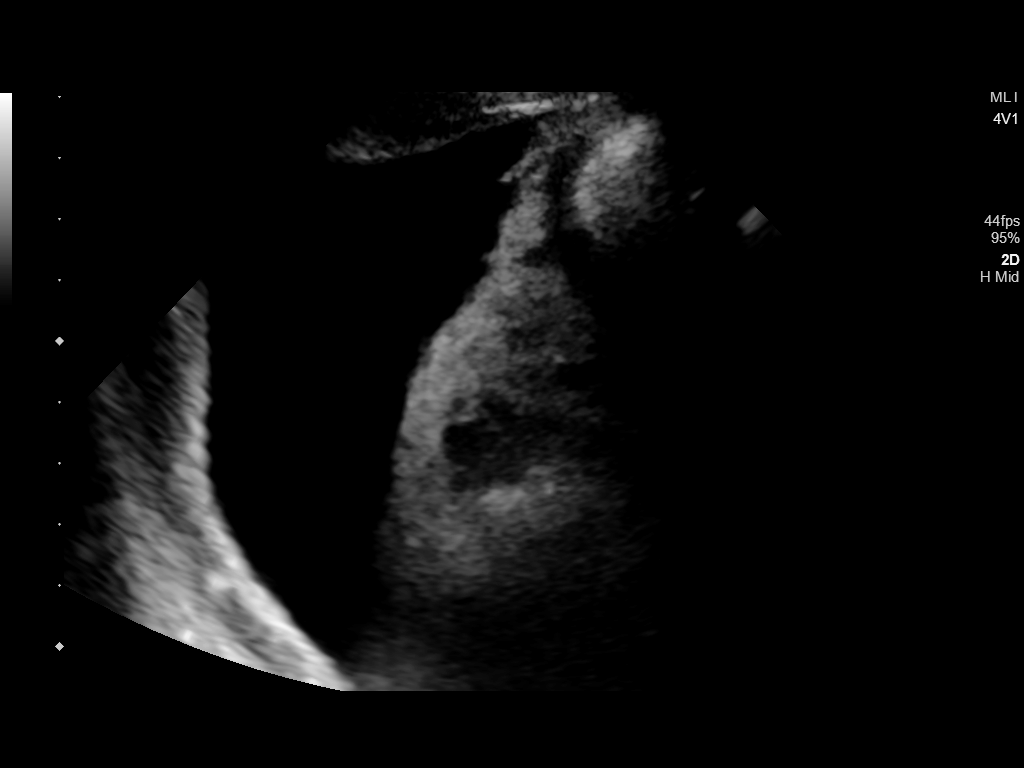

[3 of 3 positions shown; findings below may reference images not displayed]

Initial ultrasound scanning demonstrates a moderate amount of
ascites within the right lower abdominal quadrant. The right lower
abdomen was prepped and draped in the usual sterile fashion. 1%
lidocaine with epinephrine was used for local anesthesia. An
ultrasound image was saved for documentation purposed. An 8 Fr
Safe-T-Centesis catheter was introduced. The paracentesis was
performed. The catheter was removed and a dressing was applied. The
patient tolerated the procedure well without immediate post
procedural complication.
FINDINGS: A total of approximately 4 liters of serous fluid was removed.
IMPRESSION: Successful ultrasound-guided paracentesis yielding 4 liters of
peritoneal fluid.

## 2018-06-15 ENCOUNTER — Ambulatory Visit

## 2018-06-16 ENCOUNTER — Ambulatory Visit: Admission: RE | Admit: 2018-06-16 | Source: Ambulatory Visit

## 2018-07-21 DEATH — deceased

## 2018-07-23 IMAGING — CT CT ABD-PELV W/O CM
2 of 5 series · 15 of 46 positions shown, 17 images · non-contrast
Comparison: CT abdomen and pelvis 10/22/2004

CLINICAL DATA: Vomiting and decreased appetite for several days.

EXAM:
CT ABDOMEN AND PELVIS WITHOUT CONTRAST
TECHNIQUE: Multidetector CT imaging of the abdomen and pelvis was performed
following the standard protocol without IV contrast.

[Series 2: routine abd/pel wo · axial · 0.63mm/px · z∈[-1236,-801]mm · 12 of 97 slices shown, 14 images]
[im 5/97  soft-tissue]
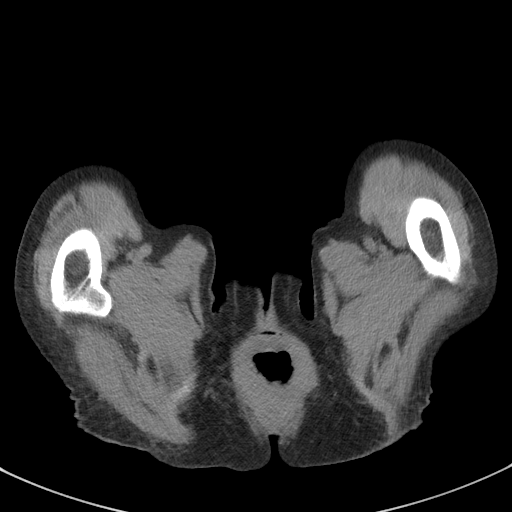
[im 5/97  bone]
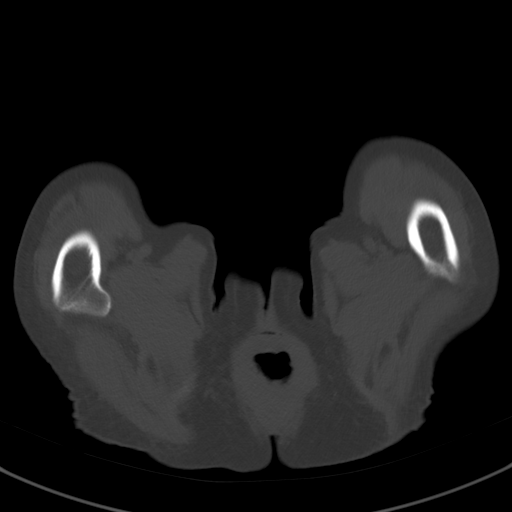
[im 14/97  soft-tissue]
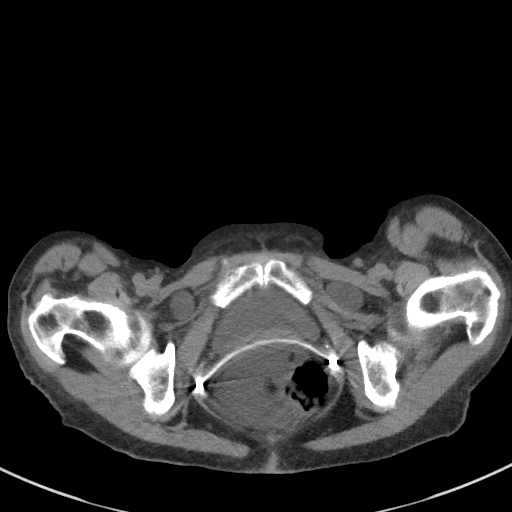
[im 23/97  soft-tissue]
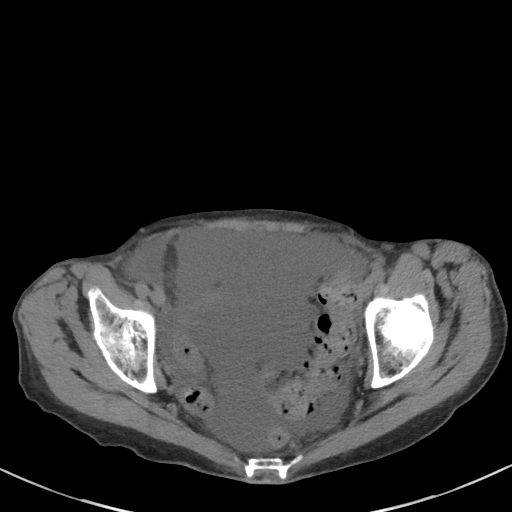
[im 28/97  soft-tissue]
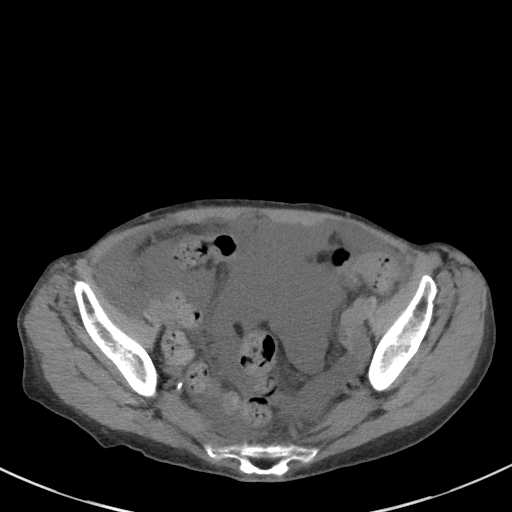
[im 37/97  soft-tissue]
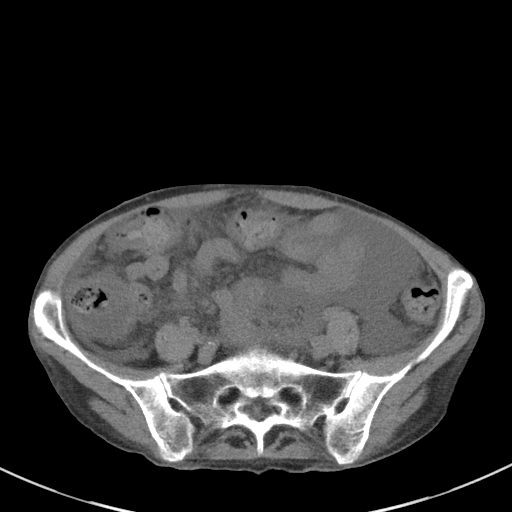
[im 46/97  soft-tissue]
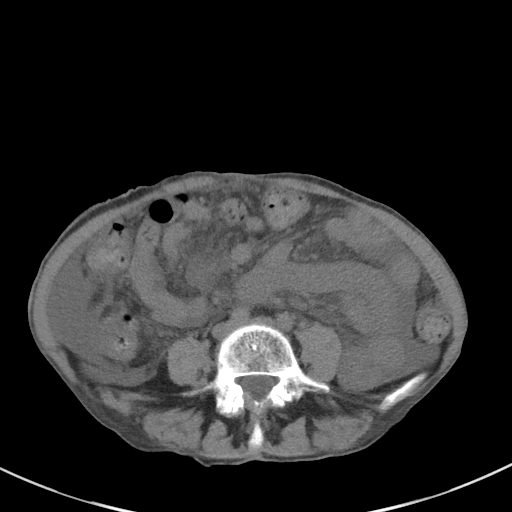
[im 51/97  soft-tissue]
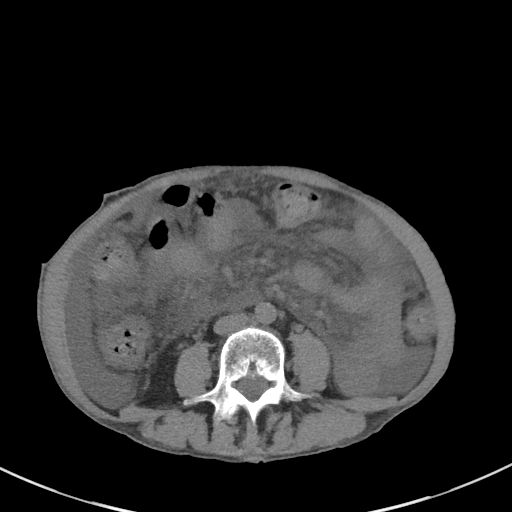
[im 60/97  soft-tissue]
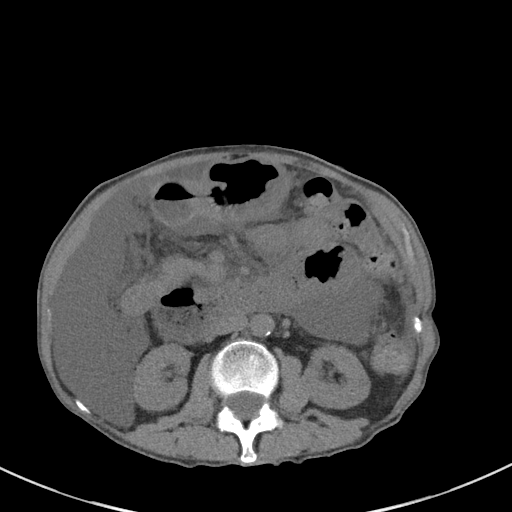
[im 69/97  soft-tissue]
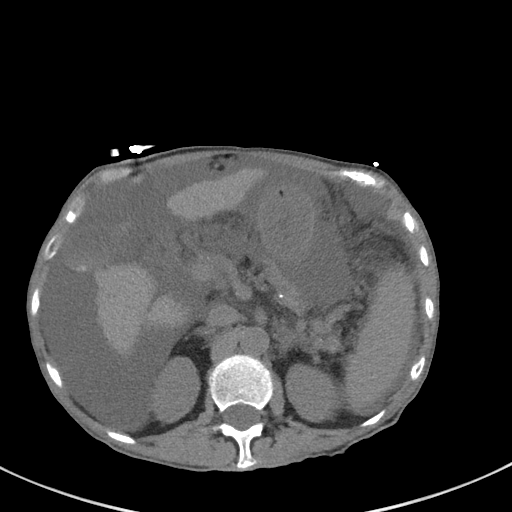
[im 69/97  bone]
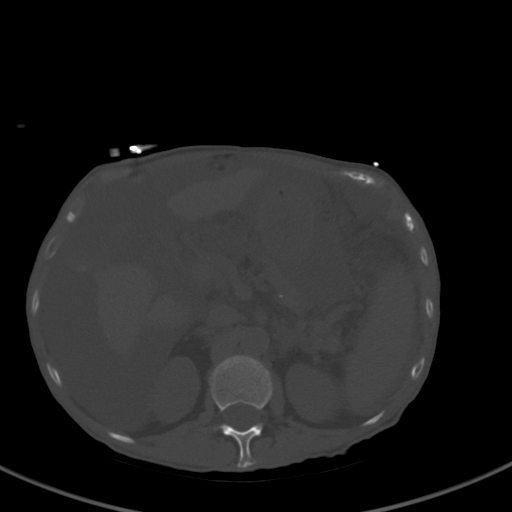
[im 74/97  soft-tissue]
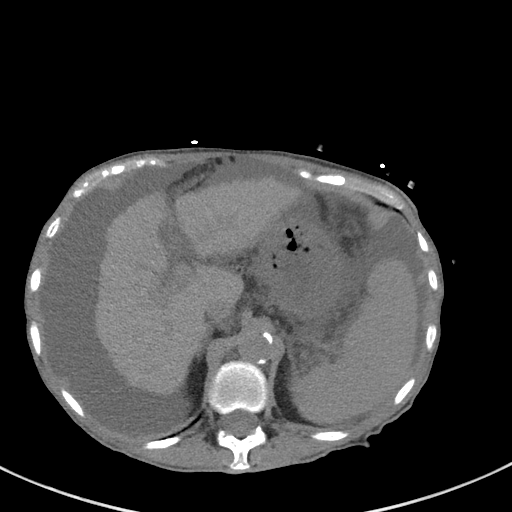
[im 83/97  soft-tissue]
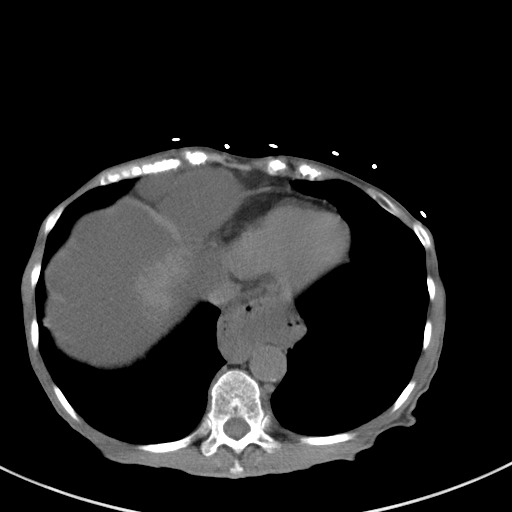
[im 92/97  soft-tissue]
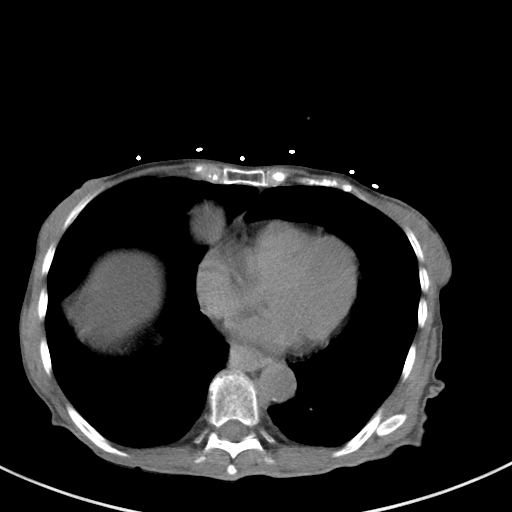

[Series 5: coronal st · coronal · 0.64mm/px · 3 of 72 slices shown]
[im 24/72  soft-tissue]
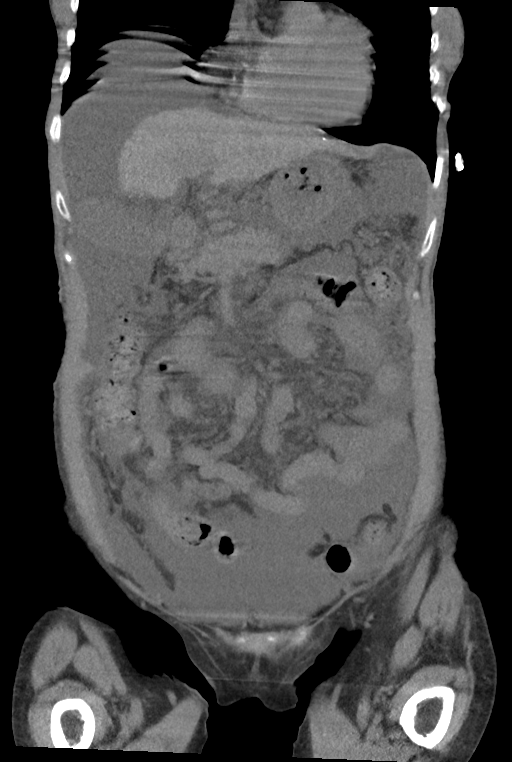
[im 32/72  soft-tissue]
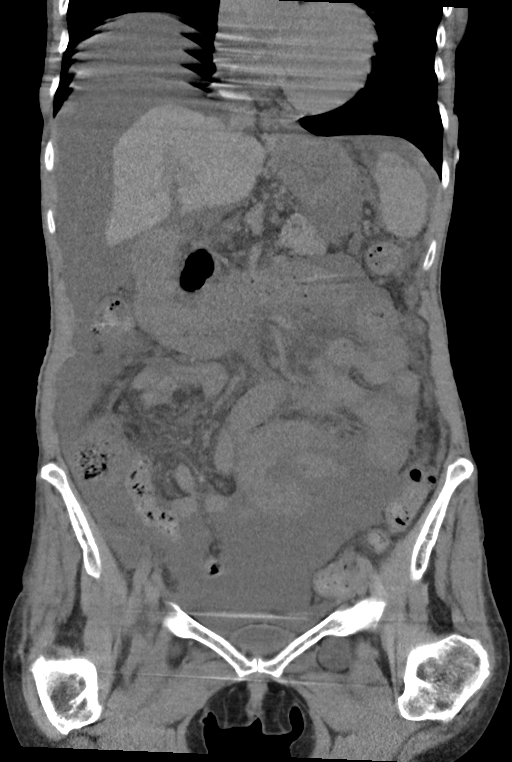
[im 40/72  soft-tissue]
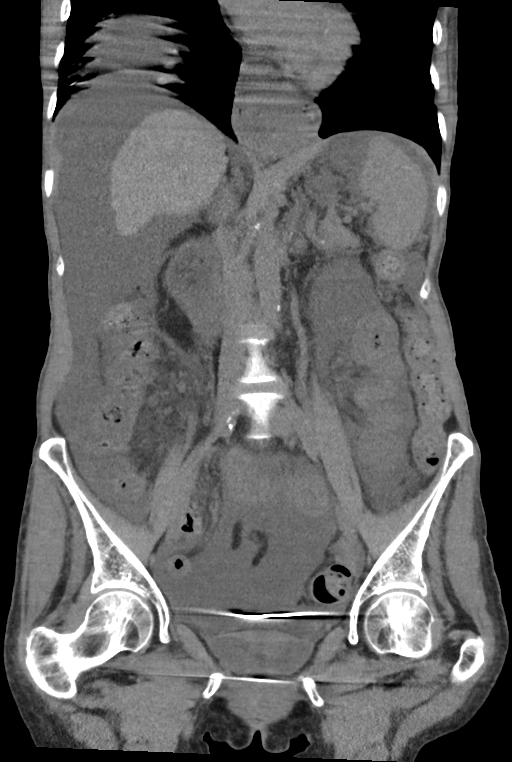

[15 of 46 positions shown; findings below may reference images not displayed]

FINDINGS: Lower chest: Heart size is normal. No pericardial effusion. Calcific
aortic and coronary atherosclerosis is identified. There is some
dependent atelectasis in the lung bases.

Hepatobiliary: The liver is shrunken with a nodular border
consistent with cirrhosis. There is recanalization of the umbilical
vein and gastric and splenic variceal formation. No focal liver
lesion is identified. There is some sludge and a single stone in the
gallbladder.

Pancreas: A few small calcifications are seen in the body of the
pancreas consistent with remote pancreatitis. The pancreas is
otherwise unremarkable.

Spleen: Normal in size without focal abnormality.

Adrenals/Urinary Tract: Small cyst off the lower pole of the right
kidney is noted. The kidneys are otherwise unremarkable. Ureters and
urinary bladder appear normal. The adrenal glands appear normal.

Stomach/Bowel: Mild thickening of the walls of the ascending colon
is likely related to cirrhosis. The colon otherwise appears normal.
The patient has a small hiatal hernia. The stomach is otherwise
unremarkable. Small bowel appears normal. No evidence of
appendicitis.

Vascular/Lymphatic: Aortic atherosclerosis. No enlarged abdominal or
pelvic lymph nodes.

Reproductive: Status post hysterectomy. No adnexal masses. Pessary
device is in place.

Other: Moderate to moderately large volume of ascites is identified.

Musculoskeletal: Negative.
IMPRESSION: No acute abnormality.

Cirrhotic liver and findings consistent with portal venous
hypertension. Moderate to moderately large volume of abdominal and
pelvic ascites is present.

Small hiatal hernia.

Gallbladder sludge and a single small stone without evidence of
cholecystitis.

Calcific aortic and coronary atherosclerosis.

## 2019-11-08 IMAGING — US US PARACENTESIS
1 series · 8 of 8 positions shown · non-contrast
Comparison: none

INDICATION: Recurrent ascites.  Cirrhosis of liver.

[Series 1: us paracentesis · 0.26mm/px · 8 of 8 slices shown]
[im 1/8]
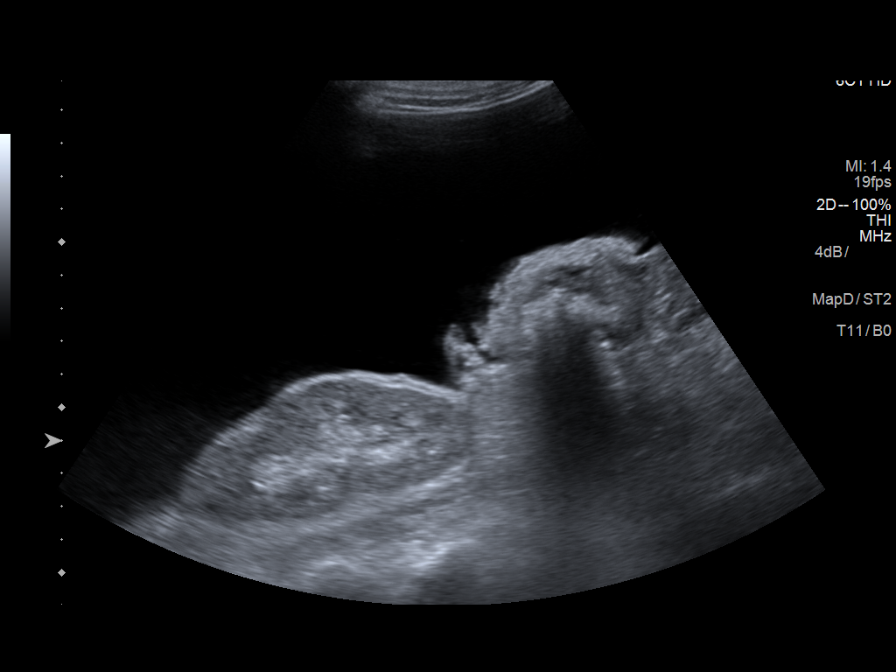
[im 2/8]
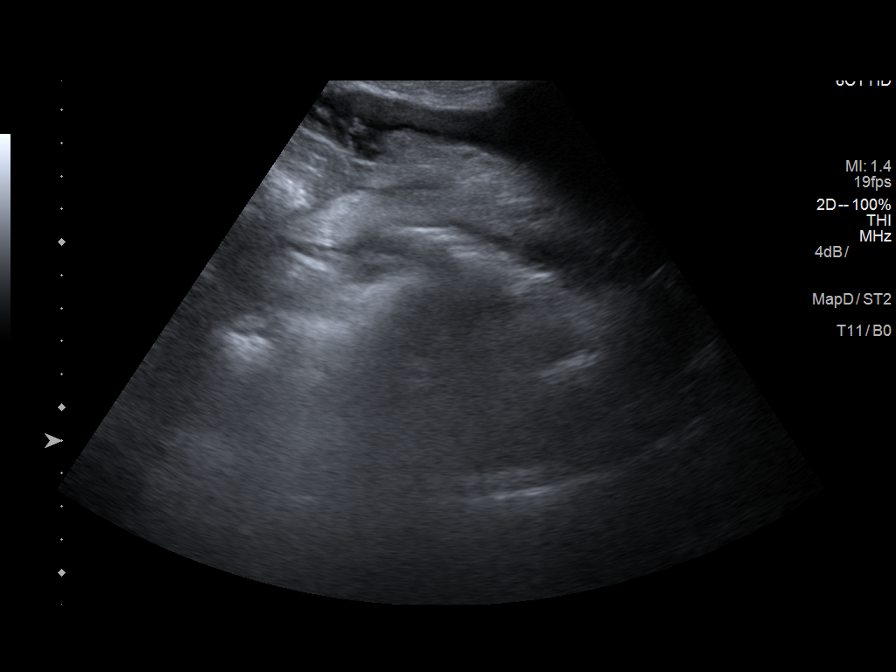
[im 3/8]
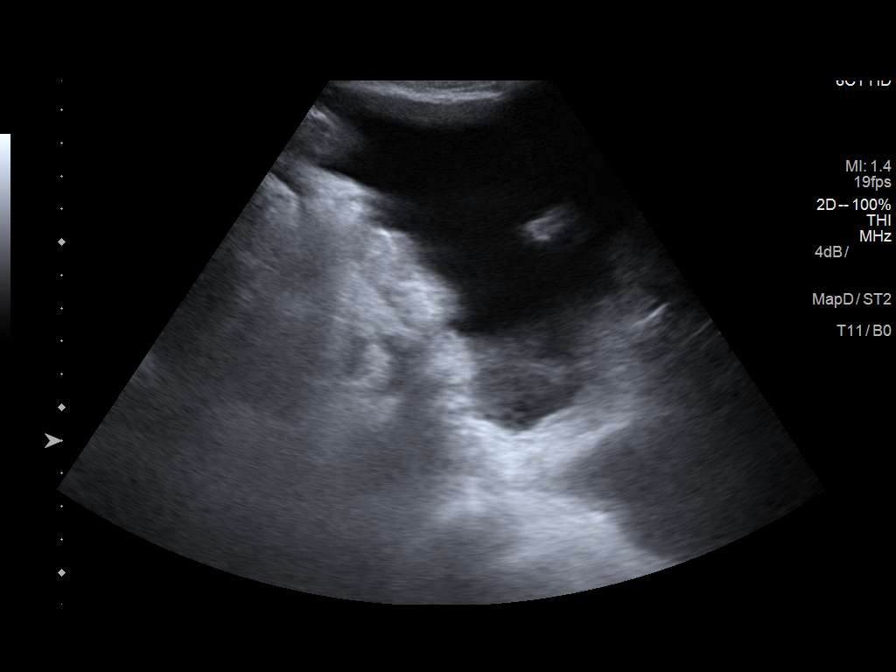
[im 4/8]
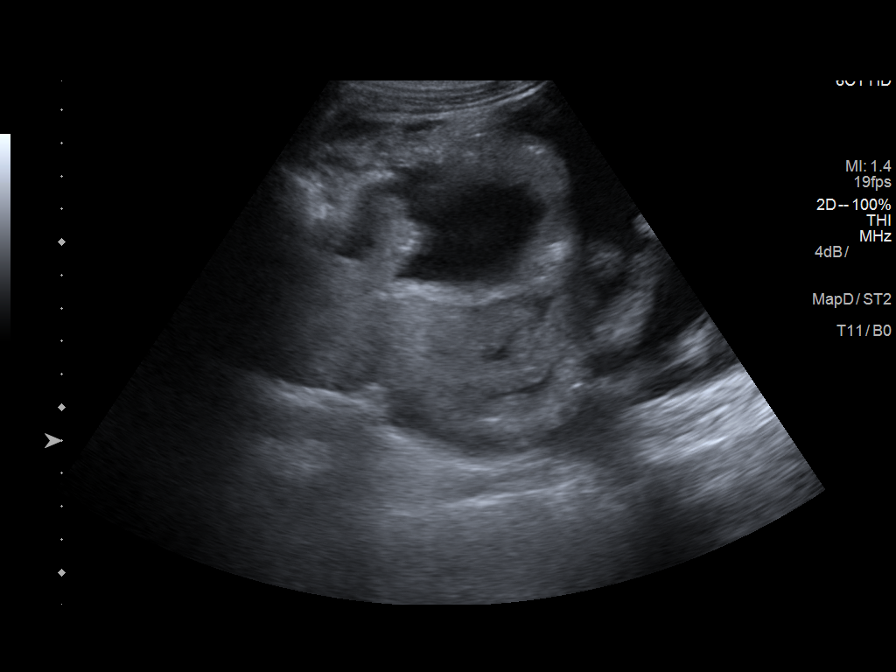
[im 5/8]
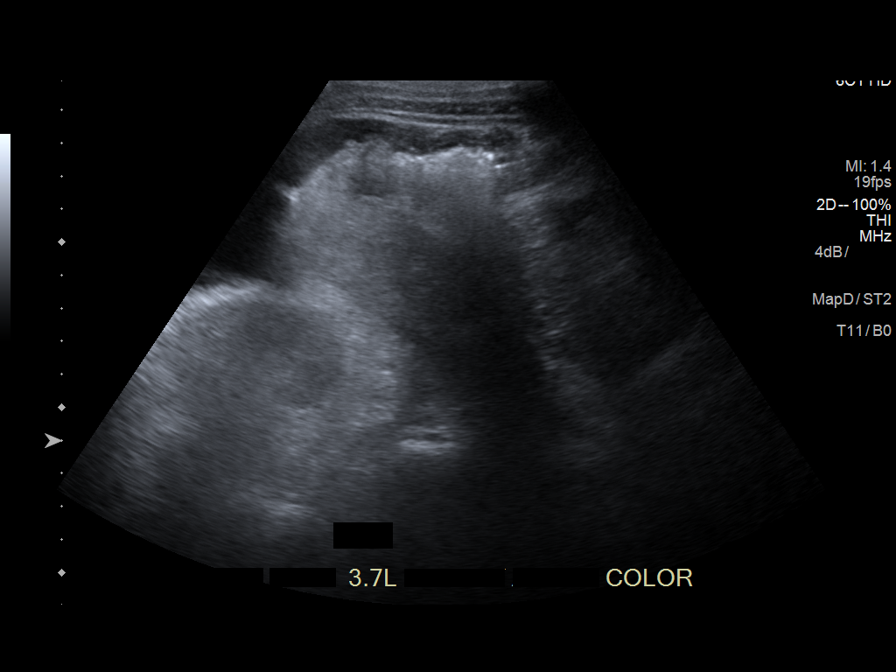
[im 6/8]
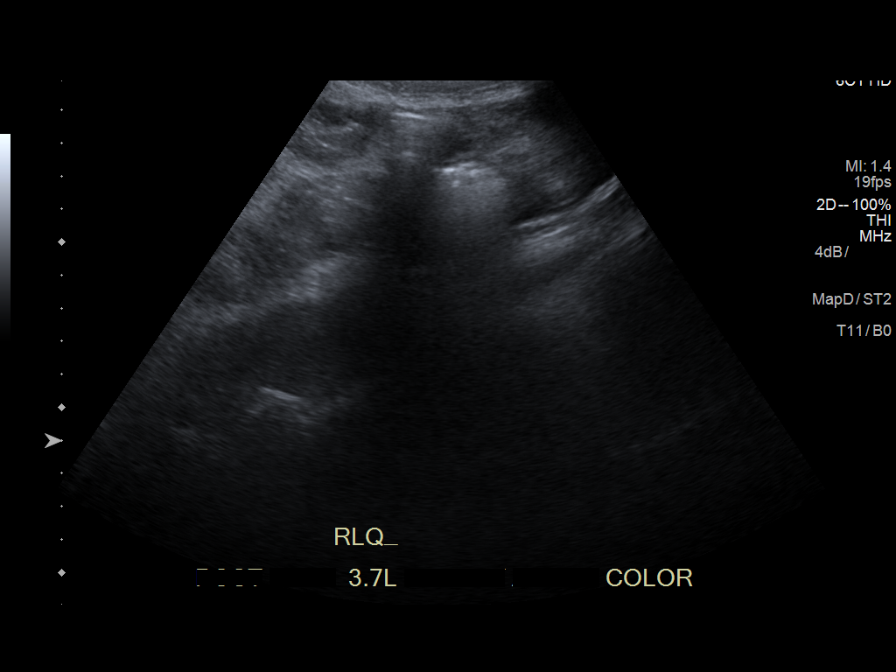
[im 7/8]
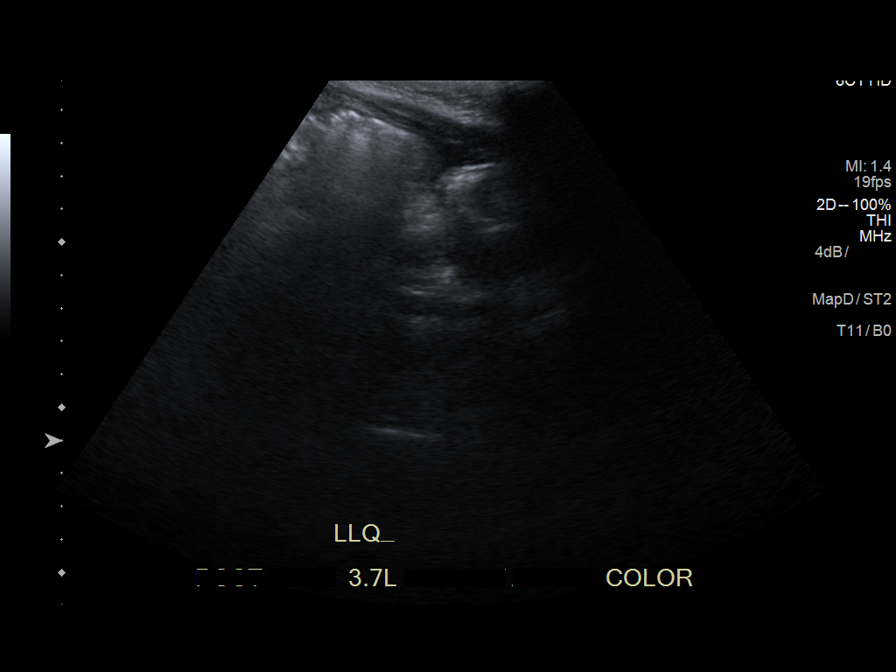
[im 8/8]
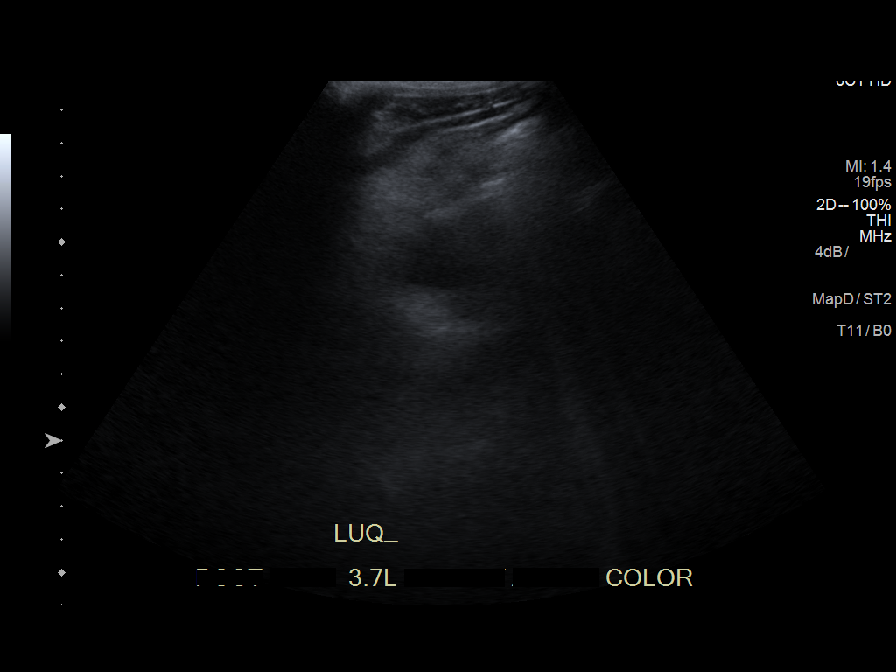

[8 of 8 positions shown; findings below may reference images not displayed]

EXAM:
ULTRASOUND GUIDED PARACENTESIS

MEDICATIONS:
None.

COMPLICATIONS:
None immediate.

PROCEDURE:
Informed written consent was obtained from the patient after a
discussion of the risks, benefits and alternatives to treatment. A
timeout was performed prior to the initiation of the procedure.

Initial ultrasound scanning demonstrates a large amount of ascites
within the right lower abdominal quadrant. The right lower abdomen
was prepped and draped in the usual sterile fashion. 1% lidocaine
was used for local anesthesia.

Following this, a 6 Fr Safe-T-Centesis catheter was introduced. An
ultrasound image was saved for documentation purposes. The
paracentesis was performed. The catheter was removed and a dressing
was applied. The patient tolerated the procedure well without
immediate post procedural complication.
FINDINGS: A total of approximately 3.7 L of cloudy amber fluid was removed.
IMPRESSION: Successful ultrasound-guided paracentesis yielding 3.7 liters of
peritoneal fluid.

## 2021-02-06 ENCOUNTER — Encounter: Payer: Self-pay | Admitting: Obstetrics and Gynecology
